# Patient Record
Sex: Female | Born: 1955 | Race: Black or African American | Hispanic: No | Marital: Single | Smoking: Never smoker
Health system: Southern US, Community
[De-identification: ages and names within clinical notes are randomized; demographics above are authoritative.]

## PROBLEM LIST (undated history)

## (undated) DIAGNOSIS — E119 Type 2 diabetes mellitus without complications: Secondary | ICD-10-CM

## (undated) DIAGNOSIS — D573 Sickle-cell trait: Secondary | ICD-10-CM

## (undated) DIAGNOSIS — I10 Essential (primary) hypertension: Secondary | ICD-10-CM

## (undated) DIAGNOSIS — T7840XA Allergy, unspecified, initial encounter: Secondary | ICD-10-CM

## (undated) DIAGNOSIS — E876 Hypokalemia: Secondary | ICD-10-CM

## (undated) DIAGNOSIS — H269 Unspecified cataract: Secondary | ICD-10-CM

## (undated) HISTORY — DX: Sickle-cell trait: D57.3

## (undated) HISTORY — DX: Type 2 diabetes mellitus without complications: E11.9

## (undated) HISTORY — DX: Allergy, unspecified, initial encounter: T78.40XA

## (undated) HISTORY — DX: Unspecified cataract: H26.9

## (undated) HISTORY — PX: HAND SURGERY: SHX662

---

## 2014-10-10 ENCOUNTER — Encounter (HOSPITAL_BASED_OUTPATIENT_CLINIC_OR_DEPARTMENT_OTHER): Payer: Self-pay | Admitting: Emergency Medicine

## 2014-10-10 ENCOUNTER — Emergency Department (HOSPITAL_BASED_OUTPATIENT_CLINIC_OR_DEPARTMENT_OTHER)
Admission: EM | Admit: 2014-10-10 | Discharge: 2014-10-10 | Disposition: A | Discharge status: Home / Self Care | Payer: Self-pay | Attending: Emergency Medicine | Admitting: Emergency Medicine

## 2014-10-10 DIAGNOSIS — R109 Unspecified abdominal pain: Secondary | ICD-10-CM | POA: Insufficient documentation

## 2014-10-10 DIAGNOSIS — R197 Diarrhea, unspecified: Secondary | ICD-10-CM | POA: Insufficient documentation

## 2014-10-10 DIAGNOSIS — R509 Fever, unspecified: Secondary | ICD-10-CM | POA: Insufficient documentation

## 2014-10-10 DIAGNOSIS — R112 Nausea with vomiting, unspecified: Secondary | ICD-10-CM

## 2014-10-10 DIAGNOSIS — Z880 Allergy status to penicillin: Secondary | ICD-10-CM | POA: Insufficient documentation

## 2014-10-10 DIAGNOSIS — R5383 Other fatigue: Secondary | ICD-10-CM | POA: Insufficient documentation

## 2014-10-10 LAB — CBC WITH DIFFERENTIAL/PLATELET
BASOS ABS: 0 10*3/uL (ref 0.0–0.1)
Basophils Relative: 0 % (ref 0–1)
EOS ABS: 0.1 10*3/uL (ref 0.0–0.7)
Eosinophils Relative: 1 % (ref 0–5)
HCT: 45.6 % (ref 36.0–46.0)
Hemoglobin: 15.5 g/dL — ABNORMAL HIGH (ref 12.0–15.0)
Lymphocytes Relative: 45 % (ref 12–46)
Lymphs Abs: 5.1 10*3/uL — ABNORMAL HIGH (ref 0.7–4.0)
MCH: 23.4 pg — AB (ref 26.0–34.0)
MCHC: 34 g/dL (ref 30.0–36.0)
MCV: 68.8 fL — ABNORMAL LOW (ref 78.0–100.0)
MONO ABS: 1 10*3/uL (ref 0.1–1.0)
Monocytes Relative: 9 % (ref 3–12)
NEUTROS PCT: 45 % (ref 43–77)
Neutro Abs: 5.1 10*3/uL (ref 1.7–7.7)
PLATELETS: 380 10*3/uL (ref 150–400)
RBC: 6.63 MIL/uL — ABNORMAL HIGH (ref 3.87–5.11)
RDW: 14.5 % (ref 11.5–15.5)
WBC: 11.3 10*3/uL — ABNORMAL HIGH (ref 4.0–10.5)

## 2014-10-10 LAB — COMPREHENSIVE METABOLIC PANEL
ALBUMIN: 4.5 g/dL (ref 3.5–5.2)
ALT: 44 U/L — AB (ref 0–35)
AST: 23 U/L (ref 0–37)
Alkaline Phosphatase: 106 U/L (ref 39–117)
Anion gap: 28 — ABNORMAL HIGH (ref 5–15)
BUN: 71 mg/dL — ABNORMAL HIGH (ref 6–23)
CALCIUM: 9.5 mg/dL (ref 8.4–10.5)
CO2: 22 mEq/L (ref 19–32)
CREATININE: 5.7 mg/dL — AB (ref 0.50–1.10)
Chloride: 82 mEq/L — ABNORMAL LOW (ref 96–112)
GFR calc Af Amer: 9 mL/min — ABNORMAL LOW (ref >90)
GFR, EST NON AFRICAN AMERICAN: 7 mL/min — AB (ref >90)
Glucose, Bld: 137 mg/dL — ABNORMAL HIGH (ref 70–99)
Potassium: 3.3 mEq/L — ABNORMAL LOW (ref 3.7–5.3)
SODIUM: 132 meq/L — AB (ref 137–147)
TOTAL PROTEIN: 9.8 g/dL — AB (ref 6.0–8.3)
Total Bilirubin: 0.4 mg/dL (ref 0.3–1.2)

## 2014-10-10 LAB — LIPASE, BLOOD: LIPASE: 84 U/L — AB (ref 11–59)

## 2014-10-10 MED ORDER — KETOROLAC TROMETHAMINE 30 MG/ML IJ SOLN
30.0000 mg | Freq: Once | INTRAMUSCULAR | Status: AC
  Administered 2014-10-10: 30 mg via INTRAVENOUS
  Filled 2014-10-10: qty 1

## 2014-10-10 MED ORDER — ONDANSETRON HCL 4 MG PO TABS: 4.0000 mg | ORAL_TABLET | Freq: Four times a day (QID) | ORAL | Status: DC

## 2014-10-10 MED ORDER — METOCLOPRAMIDE HCL 5 MG/ML IJ SOLN
10.0000 mg | Freq: Once | INTRAMUSCULAR | Status: AC
  Administered 2014-10-10: 10 mg via INTRAVENOUS
  Filled 2014-10-10: qty 2

## 2014-10-10 MED ORDER — DIPHENHYDRAMINE HCL 50 MG/ML IJ SOLN
25.0000 mg | Freq: Once | INTRAMUSCULAR | Status: AC
  Administered 2014-10-10: 25 mg via INTRAVENOUS
  Filled 2014-10-10: qty 1

## 2014-10-10 MED ORDER — SODIUM CHLORIDE 0.9 % IV BOLUS (SEPSIS)
1000.0000 mL | Freq: Once | INTRAVENOUS | Status: AC
  Administered 2014-10-10: 1000 mL via INTRAVENOUS

## 2014-10-10 NOTE — ED Notes (Signed)
Pt ambulated to restroom independently.

## 2014-10-10 NOTE — ED Notes (Signed)
Developed vomiting and diarrhea 5 days ago.  Abdominal cramping.

## 2014-10-10 NOTE — Discharge Instructions (Signed)
Please follow the directions provided.  Be sure to follow-up with your primary care provider to ensure you are getting better.  You may take the zofran to help you tolerate fluids by mouth.  You may take tylenol 650 mg every 4 hours or ibuprofen 400 mg every 4 hours to help with muscles aches.  Don't hesitate to return for any new, worsening, or concerning symptoms.    SEEK IMMEDIATE MEDICAL CARE IF:  You are unable to keep fluids down.  You do not urinate at least once every 6 to 8 hours.  You develop shortness of breath.  You notice blood in your stool or vomit. This may look like coffee grounds.  You have abdominal pain that increases or is concentrated in one small area (localized).  You have persistent vomiting or diarrhea.  You have a fever.

## 2014-10-10 NOTE — ED Provider Notes (Signed)
CSN: [id number]     Arrival date & time 10/10/14  1119 History   First MD Initiated Contact with Patient 10/10/14 1211     Chief Complaint  Patient presents with  . Emesis   (Consider location/radiation/quality/duration/timing/severity/associated sxs/prior Treatment) HPI Maria Robinson is a 58 yo female presenting with fatigue  She reports 5 days ago she began having diarrhea, subjective fever and fatigue.  Then 2 days ago she began having vomiting and abdominal cramping.  She has only vomited once today and 2 episodes of diarrhea which is an improvement but the abdominal and general muscle cramping.  She denies chest pain or shortness of breath, bloody or bilious emesis or hematachezia or melena.  History reviewed. No pertinent past medical history. History reviewed. No pertinent past surgical history. No family history on file. History  Substance Use Topics  . Smoking status: Never Smoker   . Smokeless tobacco: Not on file  . Alcohol Use: No   OB History   Grav Para Term Preterm Abortions TAB SAB Ect Mult Living                 Review of Systems  Constitutional: Positive for fever and fatigue. Negative for chills.  HENT: Negative for sore throat.   Eyes: Negative for visual disturbance.  Respiratory: Negative for cough and shortness of breath.   Cardiovascular: Negative for chest pain and leg swelling.  Gastrointestinal: Positive for nausea, vomiting, abdominal pain and diarrhea.  Genitourinary: Negative for dysuria.  Musculoskeletal: Negative for myalgias.  Skin: Negative for rash.  Neurological: Negative for weakness, numbness and headaches.    Allergies  Penicillins  Home Medications   Prior to Admission medications   Medication Sig Start Date End Date Taking? Authorizing Provider  ondansetron (ZOFRAN) 4 MG tablet Take 1 tablet (4 mg total) by mouth every 6 (six) hours. 10/10/14   Britt Bottom, NP   BP 119/86  Pulse 96  Temp(Src) 98.7 F (37.1 C) (Oral)   Resp 20  Ht 4\' 11"  (1.499 m)  Wt 145 lb (65.772 kg)  BMI 29.27 kg/m2  SpO2 97% Physical Exam  Nursing note and vitals reviewed. Constitutional: She appears well-developed and well-nourished. No distress.  HENT:  Head: Normocephalic and atraumatic.  Mouth/Throat: Mucous membranes are dry. No oropharyngeal exudate.  Eyes: Conjunctivae are normal.  Neck: Neck supple. No thyromegaly present.  Cardiovascular: Normal rate, regular rhythm and intact distal pulses.   Pulmonary/Chest: Effort normal and breath sounds normal. No respiratory distress. She has no wheezes. She has no rales. She exhibits no tenderness.  Abdominal: Soft. Bowel sounds are normal. She exhibits no distension and no mass. There is no hepatosplenomegaly. There is no tenderness. There is no rigidity, no rebound, no guarding, no CVA tenderness, no tenderness at McBurney's point and negative Murphy's sign.  Musculoskeletal: She exhibits no tenderness.  Lymphadenopathy:    She has no cervical adenopathy.  Neurological: She is alert.  Skin: Skin is warm and dry. No rash noted. She is not diaphoretic.  Psychiatric: She has a normal mood and affect.    ED Course  Procedures (including critical care time) Labs Review Labs Reviewed  CBC WITH DIFFERENTIAL - Abnormal; Notable for the following:    WBC 11.3 (*)    RBC 6.63 (*)    Hemoglobin 15.5 (*)    MCV 68.8 (*)    MCH 23.4 (*)    Lymphs Abs 5.1 (*)    All other components within normal limits  COMPREHENSIVE METABOLIC PANEL -  Abnormal; Notable for the following:    Sodium 132 (*)    Potassium 3.3 (*)    Chloride 82 (*)    Glucose, Bld 137 (*)    BUN 71 (*)    Creatinine, Ser 5.70 (*)    Total Protein 9.8 (*)    ALT 44 (*)    GFR calc non Af Amer 7 (*)    GFR calc Af Amer 9 (*)    Anion gap 28 (*)    All other components within normal limits  LIPASE, BLOOD - Abnormal; Notable for the following:    Lipase 84 (*)    All other components within normal limits    Imaging Review No results found.   EKG Interpretation None      MDM   Final diagnoses:  Nausea vomiting and diarrhea   58 yo female presenting with n/v/d and abd cramping.   NS bolus, meds for pain and nausea, CBC, CMP, lipase and UA  On re-eval, pt's symptoms have resolved and she is feeling much better.    Resources provided to establish care with a primary care provider for follow-up this week.    Pt is well-appearing and in no acute distress and verbalizes agreement with plan.  10/14/2014 6:42 PM Follow-up phone call, regarding elevated BUN and creatinine.  Pt reports unaware of existing kidney disease but has a follow-up appointment this Tuesday and will report elevated labs at appointment. Pt states she is feeling much better and is without any complaints.   Filed Vitals:   10/10/14 1135 10/10/14 1136 10/10/14 1231 10/10/14 1416  BP:  119/86 153/79 113/74  Pulse: 96  77 79  Temp: 98.7 F (37.1 C)   97.5 F (36.4 C)  TempSrc: Oral   Oral  Resp: 20  20 20   Height: 4\' 11"  (1.499 m)     Weight: 145 lb (65.772 kg)     SpO2: 97%  98% 100%   Meds given in ED:   Medications  sodium chloride 0.9 % bolus 1,000 mL (0 mLs Intravenous Stopped 10/10/14 1254)  metoCLOPramide (REGLAN) injection 10 mg (10 mg Intravenous Given 10/10/14 1230)  diphenhydrAMINE (BENADRYL) injection 25 mg (25 mg Intravenous Given 10/10/14 1230)  ketorolac (TORADOL) 30 MG/ML injection 30 mg (30 mg Intravenous Given 10/10/14 1230)    Discharge Medication List as of 10/10/2014  1:42 PM    START taking these medications   Details  ondansetron (ZOFRAN) 4 MG tablet Take 1 tablet (4 mg total) by mouth every 6 (six) hours., Starting 10/10/2014, Until Discontinued, Print           Britt Bottom, NP 10/14/14 [phone]

## 2014-10-15 NOTE — ED Provider Notes (Signed)
Medical screening examination/treatment/procedure(s) were performed by non-physician practitioner and as supervising physician I was immediately available for consultation/collaboration.   EKG Interpretation None        Malvin Johns, MD 10/15/14 [phone]

## 2017-10-05 DIAGNOSIS — M654 Radial styloid tenosynovitis [de Quervain]: Secondary | ICD-10-CM | POA: Insufficient documentation

## 2019-04-25 DIAGNOSIS — R7989 Other specified abnormal findings of blood chemistry: Secondary | ICD-10-CM

## 2019-04-25 DIAGNOSIS — R17 Unspecified jaundice: Secondary | ICD-10-CM

## 2019-05-06 DIAGNOSIS — Z5989 Other problems related to housing and economic circumstances: Secondary | ICD-10-CM | POA: Insufficient documentation

## 2019-05-09 DIAGNOSIS — I10 Essential (primary) hypertension: Secondary | ICD-10-CM | POA: Insufficient documentation

## 2019-05-09 DIAGNOSIS — J302 Other seasonal allergic rhinitis: Secondary | ICD-10-CM | POA: Insufficient documentation

## 2019-06-09 ENCOUNTER — Inpatient Hospital Stay (HOSPITAL_COMMUNITY): Payer: Self-pay

## 2019-06-09 ENCOUNTER — Other Ambulatory Visit: Payer: Self-pay

## 2019-06-09 ENCOUNTER — Inpatient Hospital Stay (HOSPITAL_COMMUNITY)
Admission: EM | Admit: 2019-06-09 | Discharge: 2019-06-14 | DRG: 638 | Disposition: A | Discharge status: Home / Self Care | Payer: Self-pay | Attending: Internal Medicine | Admitting: Internal Medicine

## 2019-06-09 ENCOUNTER — Encounter (HOSPITAL_COMMUNITY): Payer: Self-pay

## 2019-06-09 ENCOUNTER — Emergency Department (HOSPITAL_COMMUNITY): Payer: Self-pay

## 2019-06-09 DIAGNOSIS — E119 Type 2 diabetes mellitus without complications: Secondary | ICD-10-CM

## 2019-06-09 DIAGNOSIS — R7989 Other specified abnormal findings of blood chemistry: Secondary | ICD-10-CM

## 2019-06-09 DIAGNOSIS — K319 Disease of stomach and duodenum, unspecified: Secondary | ICD-10-CM | POA: Diagnosis present

## 2019-06-09 DIAGNOSIS — R17 Unspecified jaundice: Secondary | ICD-10-CM

## 2019-06-09 DIAGNOSIS — R1030 Lower abdominal pain, unspecified: Secondary | ICD-10-CM

## 2019-06-09 DIAGNOSIS — R748 Abnormal levels of other serum enzymes: Secondary | ICD-10-CM

## 2019-06-09 DIAGNOSIS — I10 Essential (primary) hypertension: Secondary | ICD-10-CM | POA: Diagnosis present

## 2019-06-09 DIAGNOSIS — Z1159 Encounter for screening for other viral diseases: Secondary | ICD-10-CM

## 2019-06-09 DIAGNOSIS — R1319 Other dysphagia: Secondary | ICD-10-CM

## 2019-06-09 DIAGNOSIS — M6281 Muscle weakness (generalized): Secondary | ICD-10-CM

## 2019-06-09 DIAGNOSIS — Z79899 Other long term (current) drug therapy: Secondary | ICD-10-CM

## 2019-06-09 DIAGNOSIS — K449 Diaphragmatic hernia without obstruction or gangrene: Secondary | ICD-10-CM | POA: Diagnosis present

## 2019-06-09 DIAGNOSIS — E1310 Other specified diabetes mellitus with ketoacidosis without coma: Secondary | ICD-10-CM

## 2019-06-09 DIAGNOSIS — K5900 Constipation, unspecified: Secondary | ICD-10-CM | POA: Diagnosis present

## 2019-06-09 DIAGNOSIS — Z833 Family history of diabetes mellitus: Secondary | ICD-10-CM

## 2019-06-09 DIAGNOSIS — K2960 Other gastritis without bleeding: Secondary | ICD-10-CM | POA: Diagnosis present

## 2019-06-09 DIAGNOSIS — D72820 Lymphocytosis (symptomatic): Secondary | ICD-10-CM | POA: Diagnosis present

## 2019-06-09 DIAGNOSIS — D509 Iron deficiency anemia, unspecified: Secondary | ICD-10-CM | POA: Diagnosis present

## 2019-06-09 DIAGNOSIS — E860 Dehydration: Secondary | ICD-10-CM | POA: Diagnosis present

## 2019-06-09 DIAGNOSIS — E871 Hypo-osmolality and hyponatremia: Secondary | ICD-10-CM

## 2019-06-09 DIAGNOSIS — E876 Hypokalemia: Secondary | ICD-10-CM | POA: Diagnosis present

## 2019-06-09 DIAGNOSIS — N179 Acute kidney failure, unspecified: Secondary | ICD-10-CM

## 2019-06-09 DIAGNOSIS — R1310 Dysphagia, unspecified: Secondary | ICD-10-CM

## 2019-06-09 DIAGNOSIS — E1110 Type 2 diabetes mellitus with ketoacidosis without coma: Principal | ICD-10-CM

## 2019-06-09 DIAGNOSIS — K8020 Calculus of gallbladder without cholecystitis without obstruction: Secondary | ICD-10-CM | POA: Diagnosis present

## 2019-06-09 HISTORY — DX: Hypokalemia: E87.6

## 2019-06-09 HISTORY — DX: Essential (primary) hypertension: I10

## 2019-06-09 LAB — GLUCOSE, CAPILLARY
Glucose-Capillary: 268 mg/dL — ABNORMAL HIGH (ref 70–99)
Glucose-Capillary: 201 mg/dL — ABNORMAL HIGH (ref 70–99)
Glucose-Capillary: 185 mg/dL — ABNORMAL HIGH (ref 70–99)
Glucose-Capillary: 130 mg/dL — ABNORMAL HIGH (ref 70–99)
Glucose-Capillary: 124 mg/dL — ABNORMAL HIGH (ref 70–99)

## 2019-06-09 LAB — CBG MONITORING, ED
Glucose-Capillary: 566 mg/dL (ref 70–99)
Glucose-Capillary: 434 mg/dL — ABNORMAL HIGH (ref 70–99)
Glucose-Capillary: 325 mg/dL — ABNORMAL HIGH (ref 70–99)
Glucose-Capillary: 264 mg/dL — ABNORMAL HIGH (ref 70–99)

## 2019-06-09 LAB — BASIC METABOLIC PANEL
Anion gap: 14 (ref 5–15)
Anion gap: 10 (ref 5–15)
BUN: 28 mg/dL — ABNORMAL HIGH (ref 8–23)
BUN: 26 mg/dL — ABNORMAL HIGH (ref 8–23)
CO2: 21 mmol/L — ABNORMAL LOW (ref 22–32)
CO2: 25 mmol/L (ref 22–32)
Calcium: 8 mg/dL — ABNORMAL LOW (ref 8.9–10.3)
Calcium: 8.1 mg/dL — ABNORMAL LOW (ref 8.9–10.3)
Chloride: 100 mmol/L (ref 98–111)
Chloride: 103 mmol/L (ref 98–111)
Creatinine, Ser: 1.01 mg/dL — ABNORMAL HIGH (ref 0.44–1.00)
Creatinine, Ser: 0.79 mg/dL (ref 0.44–1.00)
GFR calc Af Amer: >60 mL/min (ref >60)
GFR calc Af Amer: >60 mL/min (ref >60)
GFR calc non Af Amer: 60 mL/min — ABNORMAL LOW (ref >60)
GFR calc non Af Amer: >60 mL/min (ref >60)
Glucose, Bld: 299 mg/dL — ABNORMAL HIGH (ref 70–99)
Glucose, Bld: 195 mg/dL — ABNORMAL HIGH (ref 70–99)
Potassium: 4.2 mmol/L (ref 3.5–5.1)
Potassium: 3.6 mmol/L (ref 3.5–5.1)
Sodium: 135 mmol/L (ref 135–145)
Sodium: 138 mmol/L (ref 135–145)

## 2019-06-09 LAB — RAPID URINE DRUG SCREEN, HOSP PERFORMED
Amphetamines: NOT DETECTED
Barbiturates: NOT DETECTED
Benzodiazepines: NOT DETECTED
Cocaine: NOT DETECTED
Opiates: NOT DETECTED
Tetrahydrocannabinol: NOT DETECTED

## 2019-06-09 LAB — COMPREHENSIVE METABOLIC PANEL
ALT: 312 U/L — ABNORMAL HIGH (ref 0–44)
AST: 305 U/L — ABNORMAL HIGH (ref 15–41)
Albumin: 4.2 g/dL (ref 3.5–5.0)
Alkaline Phosphatase: 142 U/L — ABNORMAL HIGH (ref 38–126)
Anion gap: 25 — ABNORMAL HIGH (ref 5–15)
BUN: 34 mg/dL — ABNORMAL HIGH (ref 8–23)
CO2: 16 mmol/L — ABNORMAL LOW (ref 22–32)
Calcium: 9 mg/dL (ref 8.9–10.3)
Chloride: 85 mmol/L — ABNORMAL LOW (ref 98–111)
Creatinine, Ser: 1.42 mg/dL — ABNORMAL HIGH (ref 0.44–1.00)
GFR calc Af Amer: 46 mL/min — ABNORMAL LOW (ref >60)
GFR calc non Af Amer: 39 mL/min — ABNORMAL LOW (ref >60)
Glucose, Bld: 603 mg/dL (ref 70–99)
Potassium: 4.1 mmol/L (ref 3.5–5.1)
Sodium: 126 mmol/L — ABNORMAL LOW (ref 135–145)
Total Bilirubin: 2.6 mg/dL — ABNORMAL HIGH (ref 0.3–1.2)
Total Protein: 7.9 g/dL (ref 6.5–8.1)

## 2019-06-09 LAB — URINALYSIS, ROUTINE W REFLEX MICROSCOPIC
Bacteria, UA: NONE SEEN
Bilirubin Urine: NEGATIVE
Glucose, UA: >=500 mg/dL — AB
Hgb urine dipstick: NEGATIVE
Ketones, ur: 20 mg/dL — AB
Leukocytes,Ua: NEGATIVE
Nitrite: NEGATIVE
Protein, ur: NEGATIVE mg/dL
Specific Gravity, Urine: 1.022 (ref 1.005–1.030)
pH: 5 (ref 5.0–8.0)

## 2019-06-09 LAB — MRSA PCR SCREENING: MRSA by PCR: NEGATIVE

## 2019-06-09 LAB — HEMOGLOBIN A1C
Hgb A1c MFr Bld: 12.1 % — ABNORMAL HIGH (ref 4.8–5.6)
Mean Plasma Glucose: 300.57 mg/dL

## 2019-06-09 LAB — CBC WITH DIFFERENTIAL/PLATELET
Abs Immature Granulocytes: 0.04 10*3/uL (ref 0.00–0.07)
Basophils Absolute: 0.1 10*3/uL (ref 0.0–0.1)
Basophils Relative: 1 %
Eosinophils Absolute: 0 10*3/uL (ref 0.0–0.5)
Eosinophils Relative: 0 %
HCT: 41.3 % (ref 36.0–46.0)
Hemoglobin: 13.3 g/dL (ref 12.0–15.0)
Immature Granulocytes: 0 %
Lymphocytes Relative: 30 %
Lymphs Abs: 3.9 10*3/uL (ref 0.7–4.0)
MCH: 23.9 pg — ABNORMAL LOW (ref 26.0–34.0)
MCHC: 32.2 g/dL (ref 30.0–36.0)
MCV: 74.3 fL — ABNORMAL LOW (ref 80.0–100.0)
Monocytes Absolute: 1.1 10*3/uL — ABNORMAL HIGH (ref 0.1–1.0)
Monocytes Relative: 8 %
Neutro Abs: 8 10*3/uL — ABNORMAL HIGH (ref 1.7–7.7)
Neutrophils Relative %: 61 %
Platelets: 271 10*3/uL (ref 150–400)
RBC: 5.56 MIL/uL — ABNORMAL HIGH (ref 3.87–5.11)
RDW: 15.1 % (ref 11.5–15.5)
WBC: 13.1 10*3/uL — ABNORMAL HIGH (ref 4.0–10.5)
nRBC: 0 % (ref 0.0–0.2)

## 2019-06-09 LAB — ACETAMINOPHEN LEVEL: Acetaminophen (Tylenol), Serum: <10 ug/mL — ABNORMAL LOW (ref 10–30)

## 2019-06-09 LAB — ETHANOL: Alcohol, Ethyl (B): <10 mg/dL (ref <10)

## 2019-06-09 LAB — TROPONIN I: Troponin I: <0.03 ng/mL (ref <0.03)

## 2019-06-09 LAB — LIPASE, BLOOD: Lipase: 156 U/L — ABNORMAL HIGH (ref 11–51)

## 2019-06-09 LAB — SARS CORONAVIRUS 2 BY RT PCR (HOSPITAL ORDER, PERFORMED IN ~~LOC~~ HOSPITAL LAB): SARS Coronavirus 2: NEGATIVE

## 2019-06-09 MED ORDER — AMLODIPINE BESYLATE 5 MG PO TABS
10.0000 mg | ORAL_TABLET | Freq: Every day | ORAL | Status: DC
  Administered 2019-06-09: 10 mg via ORAL
  Filled 2019-06-09: qty 2

## 2019-06-09 MED ORDER — SODIUM CHLORIDE 0.9 % IV SOLN
INTRAVENOUS | Status: DC
  Administered 2019-06-09 (×2): via INTRAVENOUS

## 2019-06-09 MED ORDER — ENOXAPARIN SODIUM 40 MG/0.4ML ~~LOC~~ SOLN
40.0000 mg | SUBCUTANEOUS | Status: DC
  Administered 2019-06-09 – 2019-06-13 (×5): 40 mg via SUBCUTANEOUS
  Filled 2019-06-09 (×4): qty 0.4

## 2019-06-09 MED ORDER — ALUM & MAG HYDROXIDE-SIMETH 200-200-20 MG/5ML PO SUSP
30.0000 mL | Freq: Once | ORAL | Status: AC
  Administered 2019-06-09: 30 mL via ORAL
  Filled 2019-06-09: qty 30

## 2019-06-09 MED ORDER — POLYETHYLENE GLYCOL 3350 17 G PO PACK
17.0000 g | PACK | Freq: Every day | ORAL | Status: DC
  Administered 2019-06-09 – 2019-06-11 (×2): 17 g via ORAL
  Filled 2019-06-09 (×3): qty 1

## 2019-06-09 MED ORDER — SODIUM CHLORIDE 0.9 % IV BOLUS
1000.0000 mL | Freq: Once | INTRAVENOUS | Status: AC
  Administered 2019-06-09: 1000 mL via INTRAVENOUS

## 2019-06-09 MED ORDER — DEXTROSE 50 % IV SOLN: 25.0000 mL | INTRAVENOUS | Status: DC | PRN

## 2019-06-09 MED ORDER — CHLORHEXIDINE GLUCONATE CLOTH 2 % EX PADS
6.0000 | MEDICATED_PAD | Freq: Every day | CUTANEOUS | Status: DC
  Administered 2019-06-09: 6 via TOPICAL

## 2019-06-09 MED ORDER — INSULIN REGULAR BOLUS VIA INFUSION
0.0000 [IU] | Freq: Three times a day (TID) | INTRAVENOUS | Status: DC
  Filled 2019-06-09: qty 10

## 2019-06-09 MED ORDER — CHLORHEXIDINE GLUCONATE CLOTH 2 % EX PADS: 6.0000 | MEDICATED_PAD | Freq: Every day | CUTANEOUS | Status: DC

## 2019-06-09 MED ORDER — FAMOTIDINE IN NACL 20-0.9 MG/50ML-% IV SOLN
20.0000 mg | Freq: Once | INTRAVENOUS | Status: AC
  Administered 2019-06-09: 20 mg via INTRAVENOUS
  Filled 2019-06-09 (×2): qty 50

## 2019-06-09 MED ORDER — ONDANSETRON 4 MG PO TBDP
4.0000 mg | ORAL_TABLET | Freq: Once | ORAL | Status: DC | PRN
  Filled 2019-06-09: qty 1

## 2019-06-09 MED ORDER — INSULIN REGULAR(HUMAN) IN NACL 100-0.9 UT/100ML-% IV SOLN
INTRAVENOUS | Status: DC
  Administered 2019-06-09: 5.1 [IU]/h via INTRAVENOUS
  Filled 2019-06-09: qty 100

## 2019-06-09 MED ORDER — DEXTROSE-NACL 5-0.45 % IV SOLN
INTRAVENOUS | Status: DC
  Administered 2019-06-09 (×2): via INTRAVENOUS

## 2019-06-09 MED ORDER — ONDANSETRON HCL 4 MG/2ML IJ SOLN: 4.0000 mg | Freq: Four times a day (QID) | INTRAMUSCULAR | Status: DC | PRN

## 2019-06-09 MED ORDER — SODIUM CHLORIDE 0.9% FLUSH
3.0000 mL | Freq: Once | INTRAVENOUS | Status: AC
  Administered 2019-06-09: 3 mL via INTRAVENOUS

## 2019-06-09 MED ORDER — LIDOCAINE VISCOUS HCL 2 % MT SOLN
15.0000 mL | Freq: Once | OROMUCOSAL | Status: AC
  Administered 2019-06-09: 15 mL via ORAL
  Filled 2019-06-09: qty 15

## 2019-06-09 NOTE — ED Notes (Signed)
Date and time results received: 06/09/19 13:11  Test: Glucose  Critical Value: 603  Name of Provider Notified: Dr. Thurnell Garbe   Orders Received? Or Actions Taken?: Continue to monitor patient and await new orders.

## 2019-06-09 NOTE — ED Triage Notes (Signed)
Patient c/o abdominal pain, and lower back pain x 2 months. Patient states since April she has difficulty keeping anything down. Patient states after eating or drinking approx 15 minutes later she vomits everything that she puts in.  Patient states she has not been taking any of her BP meds because she felt that they were not doing her any good.  Patient states she had an appointment with her PCP today, but did no feel like they were doing her any good.

## 2019-06-09 NOTE — H&P (Signed)
History and Physical    [facility] ATF:[id number] DOB: 1956/02/04 DOA: 06/09/2019  PCP: Patient, No Pcp Per   Patient coming from: Home   Chief Complaint: Abdominal pain, lower back pain, generalized weakness  HPI: Maria Robinson is a 63 y.o. female with medical history significant of hypertension who presents from home with multiple complaints.  Patient states she has been weak Since last 2 months.  She has been following with her PCP but she is not feeling better.  Her appetite is poor.  She is throwing up and not tolerating any food by mouth.  Also complains of abdominal pain, low back pain.  She is peeing a lot has dry mouth and very thirsty.  Also complains of smelly urine.  Denies any other medical problems except for hypertension. She has subjective fever at home.  Denies chills, chest pain, shortness of breath or cough.  No contact with person found to be positive with COVID- 19.  She does not have a bowel movement since 3 to 4 days ago. Patient seen and examined the bedside in the emergency department.  Currently hemodynamically stable.  Was not in any acute distress. She is a non-smoker and does not drink alcohol.  She is usually healthy on her usual days, can walk without problem.  Lives by herself but has a son who takes care of her.  She has been recently taken off from her work due to [employer] pandemic.  ED Course: Found to have acute kidney injury, hyponatremia, hyperglycemia with DKA, elevated liver enzymes.  Started on IV fluids, initiated DKA protocol.  COVID-19 screening test negative.  Review of Systems: As per HPI otherwise 10 point review of systems negative.    Past Medical History:  Diagnosis Date   Hypertension    Low blood potassium     Past Surgical History:  Procedure Laterality Date   CESAREAN SECTION     x 3     reports that she has never smoked. She has never used smokeless tobacco. She reports that she does not drink alcohol or use  drugs.  Allergies  Allergen Reactions   Penicillins Swelling and Rash    Did it involve swelling of the face/tongue/throat, SOB, or low BP? y Did it involve sudden or severe rash/hives, skin peeling, or any reaction on the inside of your mouth or nose? y Did you need to seek medical attention at a hospital or doctor's office? y When did it last happen? 2026 If all above answers are NO, may proceed with cephalosporin use.    Family History  Problem Relation Age of Onset   Diabetes Father      Prior to Admission medications   Medication Sig Start Date End Date Taking? Authorizing Provider  amLODipine (NORVASC) 10 MG tablet Take 10 mg by mouth daily. 05/09/19  Yes [provider]  cetirizine (ZYRTEC) 5 MG tablet Take 5 mg by mouth daily.   Yes [provider]  hydrochlorothiazide (HYDRODIURIL) 25 MG tablet Take 25 mg by mouth daily. 05/26/19  Yes [provider]  KLOR-CON M10 10 MEQ tablet Take 10 mEq by mouth daily. 05/10/19  Yes [provider]  polyethylene glycol powder (GLYCOLAX/MIRALAX) 17 GM/SCOOP powder Take 17 g by mouth 2 (two) times daily as needed for constipation. 04/27/19  Yes [provider]  ondansetron (ZOFRAN) 4 MG tablet Take 1 tablet (4 mg total) by mouth every 6 (six) hours. Patient not taking: Reported on 06/09/2019 10/10/14   Britt Bottom, NP  Physical Exam: Vitals:   06/09/19 1530 06/09/19 1545 06/09/19 1600 06/09/19 1615  BP: 129/76 124/73 121/82 113/73  Pulse: 88 86 89 89  Resp: (!) 21 20 (!) 24 (!) 24  Temp:      TempSrc:      SpO2: 100% 99% 100% 99%  Weight:      Height:        Constitutional: weak Vitals:   06/09/19 1530 06/09/19 1545 06/09/19 1600 06/09/19 1615  BP: 129/76 124/73 121/82 113/73  Pulse: 88 86 89 89  Resp: (!) 21 20 (!) 24 (!) 24  Temp:      TempSrc:      SpO2: 100% 99% 100% 99%  Weight:      Height:       Eyes: PERRL, lids and conjunctivae normal ENMT: Mucous  membranes are dry. Posterior pharynx clear of any exudate or lesions.Normal dentition.  Neck: normal, supple, no masses, no thyromegaly Respiratory: clear to auscultation bilaterally, no wheezing, no crackles. Normal respiratory effort. No accessory muscle use.  Cardiovascular: Regular rate and rhythm, no murmurs / rubs / gallops. No extremity edema. 2+ pedal pulses. No carotid bruits.  Abdomen: no tenderness, no masses palpated. No hepatosplenomegaly. Bowel sounds positive.  Musculoskeletal: no clubbing / cyanosis. No joint deformity upper and lower extremities. Good ROM, no contractures. Normal muscle tone.  Skin: no rashes, lesions, ulcers. No induration Neurologic: CN 2-12 grossly intact. Sensation intact, DTR normal. Strength 5/5 in all 4.  Psychiatric: Normal judgment and insight. Alert and oriented x 3. Normal mood.   Foley Catheter:None  Labs on Admission: I have personally reviewed following labs and imaging studies  CBC: Recent Labs  Lab 06/09/19 1234  WBC 13.1*  NEUTROABS 8.0*  HGB 13.3  HCT 41.3  MCV 74.3*  PLT 267   Basic Metabolic Panel: Recent Labs  Lab 06/09/19 1234  NA 126*  K 4.1  CL 85*  CO2 16*  GLUCOSE 603*  BUN 34*  CREATININE 1.42*  CALCIUM 9.0   GFR: Estimated Creatinine Clearance: 33.4 mL/min (A) (by C-G formula based on SCr of 1.42 mg/dL (H)). Liver Function Tests: Recent Labs  Lab 06/09/19 1234  AST 305*  ALT 312*  ALKPHOS 142*  BILITOT 2.6*  PROT 7.9  ALBUMIN 4.2   Recent Labs  Lab 06/09/19 1234  LIPASE 156*   No results for input(s): AMMONIA in the last 168 hours. Coagulation Profile: No results for input(s): INR, PROTIME in the last 168 hours. Cardiac Enzymes: Recent Labs  Lab 06/09/19 1234  TROPONINI <0.03   BNP (last 3 results) No results for input(s): PROBNP in the last 8760 hours. HbA1C: No results for input(s): HGBA1C in the last 72 hours. CBG: Recent Labs  Lab 06/09/19 1344 06/09/19 1448 06/09/19 1606   GLUCAP 566* 434* 325*   Lipid Profile: No results for input(s): CHOL, HDL, LDLCALC, TRIG, CHOLHDL, LDLDIRECT in the last 72 hours. Thyroid Function Tests: No results for input(s): TSH, T4TOTAL, FREET4, T3FREE, THYROIDAB in the last 72 hours. Anemia Panel: No results for input(s): VITAMINB12, FOLATE, FERRITIN, TIBC, IRON, RETICCTPCT in the last 72 hours. Urine analysis:    Component Value Date/Time   COLORURINE YELLOW 06/09/2019 1328   APPEARANCEUR CLEAR 06/09/2019 1328   LABSPEC 1.022 06/09/2019 1328   PHURINE 5.0 06/09/2019 1328   GLUCOSEU >=500 (A) 06/09/2019 1328   HGBUR NEGATIVE 06/09/2019 1328   BILIRUBINUR NEGATIVE 06/09/2019 1328   KETONESUR 20 (A) 06/09/2019 1328   PROTEINUR NEGATIVE 06/09/2019 1328  NITRITE NEGATIVE 06/09/2019 [place] 06/09/2019 1328    Radiological Exams on Admission: Ct Abdomen Pelvis Wo Contrast  Result Date: 06/09/2019 CLINICAL DATA:  Abdominal pain and low back pain for 2 months. EXAM: CT ABDOMEN AND PELVIS WITHOUT CONTRAST TECHNIQUE: Multidetector CT imaging of the abdomen and pelvis was performed following the standard protocol without IV contrast. COMPARISON:  None. FINDINGS: Lower chest: Clear lung bases.  Heart normal in size Hepatobiliary: Normal liver. Few small stones in the gallbladder fundus. No gallbladder wall thickening or inflammation. No bile duct dilation Pancreas: Unremarkable. No pancreatic ductal dilatation or surrounding inflammatory changes. Spleen: Normal in size without focal abnormality. Adrenals/Urinary Tract: Adrenal glands are unremarkable. Kidneys are normal, without renal calculi, focal lesion, or hydronephrosis. Bladder is unremarkable. Stomach/Bowel: Stomach is within normal limits. Appendix appears normal. No evidence of bowel wall thickening, distention, or inflammatory changes. Vascular/Lymphatic: Aortic atherosclerotic calcifications. No aneurysm. No enlarged lymph nodes. Reproductive: Uterus and  bilateral adnexa are unremarkable. Other: No abdominal wall hernia or abnormality. No abdominopelvic ascites. Musculoskeletal: No fracture or acute finding. No osteoblastic or osteolytic lesions. Degenerative changes noted of the visualized spine. IMPRESSION: 1. No acute findings. 2. Degenerative changes of the lumbar spine may account for patient's low back pain 3. Small gallstones.  No acute cholecystitis. 4. Aortic atherosclerosis. Electronically Signed   By: Lajean Manes M.D.   On: 06/09/2019 15:03   Dg Chest 2 View  Result Date: 06/09/2019 CLINICAL DATA:  Nausea, vomiting and abdominal pain. EXAM: CHEST - 2 VIEW COMPARISON:  None. FINDINGS: Cardiac silhouette is normal in size. Normal mediastinal and hilar contours. Clear lungs.  No pleural effusion or pneumothorax. Skeletal structures are intact. IMPRESSION: No active cardiopulmonary disease. Electronically Signed   By: Lajean Manes M.D.   On: 06/09/2019 15:04     Assessment/Plan Principal Problem:   DKA (diabetic ketoacidoses) ([place]) Active Problems:   DM (diabetes mellitus) ([place])   AKI (acute kidney injury) ([place])   Elevated liver enzymes   Hyponatremia   Muscle weakness (generalized)   DKA: Started on IV fluids, DKA protocol.  No history of diabetes in the past.  We will check hemoglobin A1c level.  We will consult diabetic coordinator.  Keep her n.p.o. for now.  Monitor BMP every 4 hours. When anion gap closes, start on long-acting insulin and sliding scale.  Nausea/vomiting/abdominal pain: Most likely associated with DKA.  Continue supportive care.  Elevated liver enzymes/elevated lipase: Unknown etiology,associated with DKA?   CT abdomen/pelvis did not show any acute intra-abdominal findings besides cholelithiasis.  Patient does not have any abdominal tenderness.  No gallbladder wall thickening or pericholecystic fluid as per CT imaging.  Will check ultrasound of the right upper quadrant.  Will check acute hepatitis panel.  We  will follow-up liver enzymes tomorrow.  Hyponatremia: This is secondary to hyperosmolar hyponatremia.  Expect improvement with decrease in the blood sugar.  Acute kidney injury: Secondary to dehydration.  Continue IV fluids.  Hypertension: Currently blood pressure stable.  Continue current meds  Muscle weakness: We will request for physical therapy evaluation.     Severity of Illness: The appropriate patient status for this patient is INPATIENT.    DVT prophylaxis: Lovenox Code Status: Full Family Communication: None present at the bedside Consults called: None     Shelly Coss MD [facility] Pager [phone]  If 7PM-7AM, please contact night-coverage [email or web address] Password [email or web address]  06/09/2019, 4:33 PM

## 2019-06-09 NOTE — ED Notes (Signed)
Urine culture sent down to lab with urinalysis. 

## 2019-06-09 NOTE — ED Notes (Signed)
Ultrasound bedside.

## 2019-06-09 NOTE — ED Provider Notes (Signed)
[facility] DEPT Provider Note   CSN: [id number] Arrival date & time: 06/09/19  1115     History   Chief Complaint Chief Complaint  Patient presents with  . Abdominal Pain  . Back Pain  . Emesis    HPI Maria Robinson is a 63 y.o. female.      Abdominal Pain Associated symptoms: vomiting   Back Pain Associated symptoms: abdominal pain   Emesis Associated symptoms: abdominal pain     Pt was seen at 1230. Per pt, c/o gradual onset and worsening of persistent generalized abd pain and recurrent N/V for the past 2 months. Pt states she "just vomits every time I eat." Pt states she has been evaluated by her PMD and put " on medicines for my stomach and by BP" but she is not taking them "because they don't do anything." Denies back pain, no CP/SOB, no cough, no fevers, no rash, no injury, no black or blood in stools or emesis.   Past Medical History:  Diagnosis Date  . Hypertension   . Low blood potassium     There are no active problems to display for this patient.   Past Surgical History:  Procedure Laterality Date  . CESAREAN SECTION     x 3     OB History   No obstetric history on file.      Home Medications    Prior to Admission medications   Medication Sig Start Date End Date Taking? Authorizing Provider  amLODipine (NORVASC) 10 MG tablet Take 10 mg by mouth daily. 05/09/19  Yes [provider]  cetirizine (ZYRTEC) 5 MG tablet Take 5 mg by mouth daily.   Yes [provider]  hydrochlorothiazide (HYDRODIURIL) 25 MG tablet Take 25 mg by mouth daily. 05/26/19  Yes [provider]  KLOR-CON M10 10 MEQ tablet Take 10 mEq by mouth daily. 05/10/19  Yes [provider]  polyethylene glycol powder (GLYCOLAX/MIRALAX) 17 GM/SCOOP powder Take 17 g by mouth 2 (two) times daily as needed for constipation. 04/27/19  Yes [provider]  ondansetron (ZOFRAN) 4 MG tablet Take 1 tablet (4 mg total) by  mouth every 6 (six) hours. Patient not taking: Reported on 06/09/2019 10/10/14   Britt Bottom, NP    Family History Family History  Problem Relation Age of Onset  . Diabetes Father     Social History Social History   Tobacco Use  . Smoking status: Never Smoker  . Smokeless tobacco: Never Used  Substance Use Topics  . Alcohol use: No  . Drug use: No     Allergies   Penicillins   Review of Systems Review of Systems  Gastrointestinal: Positive for abdominal pain and vomiting.  Musculoskeletal: Positive for back pain.   ROS: Statement: All systems negative except as marked or noted in the HPI; Constitutional: Negative for fever and chills. ; ; Eyes: Negative for eye pain, redness and discharge. ; ; ENMT: Negative for ear pain, hoarseness, nasal congestion, sinus pressure and sore throat. ; ; Cardiovascular: Negative for chest pain, palpitations, diaphoresis, dyspnea and peripheral edema. ; ; Respiratory: Negative for cough, wheezing and stridor. ; ; Gastrointestinal: +N/V, abd pain. Negative for diarrhea, blood in stool, hematemesis, jaundice and rectal bleeding. . ; ; Genitourinary: Negative for dysuria, flank pain and hematuria. ; ; Musculoskeletal: Negative for back pain and neck pain. Negative for swelling and trauma.; ; Skin: Negative for pruritus, rash, abrasions, blisters, bruising and skin lesion.; ; Neuro: Negative for  headache, lightheadedness and neck stiffness. Negative for weakness, altered level of consciousness, altered mental status, extremity weakness, paresthesias, involuntary movement, seizure and syncope.       Physical Exam Updated Vital Signs BP 124/71   Pulse 94   Temp 98.5 F (36.9 C) (Oral)   Resp 18   Ht 4\' 11"  (1.499 m)   Wt 64 kg   SpO2 98%   BMI 28.48 kg/m    Patient Vitals for the past 24 hrs:  BP Temp Temp src Pulse Resp SpO2 Height Weight  06/09/19 1500 124/71 - - 94 18 98 % - -  06/09/19 1445 126/82 - - 100 19 100 % - -  06/09/19  1433 - - - - - - 4\' 11"  (1.499 m) 64 kg  06/09/19 1415 (!) 118/101 - - 90 20 100 % - -  06/09/19 1339 (!) 126/107 - - 99 20 100 % - -  06/09/19 1130 (!) 134/100 98.5 F (36.9 C) Oral (!) 108 16 100 % - -     Physical Exam 1235: Physical examination:  Nursing notes reviewed; Vital signs and O2 SAT reviewed;  Constitutional: Well developed, Well nourished, In no acute distress; Head:  Normocephalic, atraumatic; Eyes: EOMI, PERRL, No scleral icterus; ENMT: Mouth and pharynx normal, Mucous membranes dry; Neck: Supple, Full range of motion, No lymphadenopathy; Cardiovascular: Regular rate and rhythm, No gallop; Respiratory: Breath sounds clear & equal bilaterally, No wheezes.  Speaking full sentences with ease, Normal respiratory effort/excursion; Chest: Nontender, Movement normal; Abdomen: Soft, Nontender, Nondistended, Normal bowel sounds; Genitourinary: No CVA tenderness; Extremities: Peripheral pulses normal, No tenderness, No edema, No calf edema or asymmetry.; Neuro: AA&Ox3, Major CN grossly intact.  Speech clear. No gross focal motor or sensory deficits in extremities.; Skin: Color normal, Warm, Dry.     ED Treatments / Results  Labs (all labs ordered are listed, but only abnormal results are displayed)   EKG EKG Interpretation  Date/Time:  Thursday June 09 2019 12:46:03 EDT Ventricular Rate:  93 PR Interval:    QRS Duration: 81 QT Interval:  423 QTC Calculation: 527 R Axis:   27 Text Interpretation:  Sinus rhythm Low voltage, precordial leads Borderline T abnormalities, lateral leads Prolonged QT interval No old tracing to compare Confirmed by Francine Graven [phone]) on 06/09/2019 1:11:16 PM   Radiology   Procedures Procedures (including critical care time)  Medications Ordered in ED Medications  famotidine (PEPCID) IVPB 20 mg premix (has no administration in time range)  dextrose 5 %-0.45 % sodium chloride infusion (has no administration in time range)  insulin regular  bolus via infusion 0-10 Units (has no administration in time range)  insulin regular, human (MYXREDLIN) 100 units/ 100 mL infusion (3.7 Units/hr Intravenous Rate/Dose Change 06/09/19 1505)  dextrose 50 % solution 25 mL (has no administration in time range)  0.9 %  sodium chloride infusion (has no administration in time range)  sodium chloride flush (NS) 0.9 % injection 3 mL (3 mLs Intravenous Given 06/09/19 1356)  sodium chloride 0.9 % bolus 1,000 mL (1,000 mLs Intravenous Bolus from Bag 06/09/19 1353)  alum & mag hydroxide-simeth (MAALOX/MYLANTA) 200-200-20 MG/5ML suspension 30 mL (30 mLs Oral Given 06/09/19 1353)    And  lidocaine (XYLOCAINE) 2 % viscous mouth solution 15 mL (15 mLs Oral Given 06/09/19 1354)  sodium chloride 0.9 % bolus 1,000 mL (1,000 mLs Intravenous Bolus from Bag 06/09/19 1509)     Initial Impression / Assessment and Plan / ED Course  I have  reviewed the triage vital signs and the nursing notes.  Pertinent labs & imaging results that were available during my care of the patient were reviewed by me and considered in my medical decision making (see chart for details).     MDM Reviewed: previous chart, nursing note and vitals Reviewed previous: labs and ECG Interpretation: labs, ECG, x-ray and CT scan Total time providing critical care: 30-74 minutes. This excludes time spent performing separately reportable procedures and services. Consults: admitting MD   CRITICAL CARE Performed by: Francine Graven Total critical care time: 35 minutes Critical care time was exclusive of separately billable procedures and treating other patients. Critical care was necessary to treat or prevent imminent or life-threatening deterioration. Critical care was time spent personally by me on the following activities: development of treatment plan with patient and/or surrogate as well as nursing, discussions with consultants, evaluation of patient's response to treatment, examination of  patient, obtaining history from patient or surrogate, ordering and performing treatments and interventions, ordering and review of laboratory studies, ordering and review of radiographic studies, pulse oximetry and re-evaluation of patient's condition.  Results for orders placed or performed during the hospital encounter of 06/09/19  Lipase, blood  Result Value Ref Range   Lipase 156 (H) 11 - 51 U/L  Comprehensive metabolic panel  Result Value Ref Range   Sodium 126 (L) 135 - 145 mmol/L   Potassium 4.1 3.5 - 5.1 mmol/L   Chloride 85 (L) 98 - 111 mmol/L   CO2 16 (L) 22 - 32 mmol/L   Glucose, Bld 603 (HH) 70 - 99 mg/dL   BUN 34 (H) 8 - 23 mg/dL   Creatinine, Ser 1.42 (H) 0.44 - 1.00 mg/dL   Calcium 9.0 8.9 - 10.3 mg/dL   Total Protein 7.9 6.5 - 8.1 g/dL   Albumin 4.2 3.5 - 5.0 g/dL   AST 305 (H) 15 - 41 U/L   ALT 312 (H) 0 - 44 U/L   Alkaline Phosphatase 142 (H) 38 - 126 U/L   Total Bilirubin 2.6 (H) 0.3 - 1.2 mg/dL   GFR calc non Af Amer 39 (L) >60 mL/min   GFR calc Af Amer 46 (L) >60 mL/min   Anion gap 25 (H) 5 - 15  Urinalysis, Routine w reflex microscopic  Result Value Ref Range   Color, Urine YELLOW YELLOW   APPearance CLEAR CLEAR   Specific Gravity, Urine 1.022 1.005 - 1.030   pH 5.0 5.0 - 8.0   Glucose, UA >=500 (A) NEGATIVE mg/dL   Hgb urine dipstick NEGATIVE NEGATIVE   Bilirubin Urine NEGATIVE NEGATIVE   Ketones, ur 20 (A) NEGATIVE mg/dL   Protein, ur NEGATIVE NEGATIVE mg/dL   Nitrite NEGATIVE NEGATIVE   Leukocytes,Ua NEGATIVE NEGATIVE   RBC / HPF 0-5 0 - 5 RBC/hpf   WBC, UA 0-5 0 - 5 WBC/hpf   Bacteria, UA NONE SEEN NONE SEEN   Squamous Epithelial / LPF 0-5 0 - 5   Mucus PRESENT    Hyaline Casts, UA PRESENT   CBC with Differential  Result Value Ref Range   WBC 13.1 (H) 4.0 - 10.5 K/uL   RBC 5.56 (H) 3.87 - 5.11 MIL/uL   Hemoglobin 13.3 12.0 - 15.0 g/dL   HCT 41.3 36.0 - 46.0 %   MCV 74.3 (L) 80.0 - 100.0 fL   MCH 23.9 (L) 26.0 - 34.0 pg   MCHC 32.2 30.0 -  36.0 g/dL   RDW 15.1 11.5 - 15.5 %   Platelets  271 150 - 400 K/uL   nRBC 0.0 0.0 - 0.2 %   Neutrophils Relative % 61 %   Neutro Abs 8.0 (H) 1.7 - 7.7 K/uL   Lymphocytes Relative 30 %   Lymphs Abs 3.9 0.7 - 4.0 K/uL   Monocytes Relative 8 %   Monocytes Absolute 1.1 (H) 0.1 - 1.0 K/uL   Eosinophils Relative 0 %   Eosinophils Absolute 0.0 0.0 - 0.5 K/uL   Basophils Relative 1 %   Basophils Absolute 0.1 0.0 - 0.1 K/uL   Immature Granulocytes 0 %   Abs Immature Granulocytes 0.04 0.00 - 0.07 K/uL  Troponin I - Once  Result Value Ref Range   Troponin I <0.03 <0.03 ng/mL  Urine rapid drug screen (hosp performed)  Result Value Ref Range   Opiates NONE DETECTED NONE DETECTED   Cocaine NONE DETECTED NONE DETECTED   Benzodiazepines NONE DETECTED NONE DETECTED   Amphetamines NONE DETECTED NONE DETECTED   Tetrahydrocannabinol NONE DETECTED NONE DETECTED   Barbiturates NONE DETECTED NONE DETECTED  Acetaminophen level  Result Value Ref Range   Acetaminophen (Tylenol), Serum <10 (L) 10 - 30 ug/mL  Ethanol  Result Value Ref Range   Alcohol, Ethyl (B) <10 <10 mg/dL  CBG monitoring, ED  Result Value Ref Range   Glucose-Capillary 566 (HH) 70 - 99 mg/dL   Comment 1 Notify RN   CBG monitoring, ED  Result Value Ref Range   Glucose-Capillary 434 (H) 70 - 99 mg/dL   Ct Abdomen Pelvis Wo Contrast Result Date: 06/09/2019 CLINICAL DATA:  Abdominal pain and low back pain for 2 months. EXAM: CT ABDOMEN AND PELVIS WITHOUT CONTRAST TECHNIQUE: Multidetector CT imaging of the abdomen and pelvis was performed following the standard protocol without IV contrast. COMPARISON:  None. FINDINGS: Lower chest: Clear lung bases.  Heart normal in size Hepatobiliary: Normal liver. Few small stones in the gallbladder fundus. No gallbladder wall thickening or inflammation. No bile duct dilation Pancreas: Unremarkable. No pancreatic ductal dilatation or surrounding inflammatory changes. Spleen: Normal in size without focal  abnormality. Adrenals/Urinary Tract: Adrenal glands are unremarkable. Kidneys are normal, without renal calculi, focal lesion, or hydronephrosis. Bladder is unremarkable. Stomach/Bowel: Stomach is within normal limits. Appendix appears normal. No evidence of bowel wall thickening, distention, or inflammatory changes. Vascular/Lymphatic: Aortic atherosclerotic calcifications. No aneurysm. No enlarged lymph nodes. Reproductive: Uterus and bilateral adnexa are unremarkable. Other: No abdominal wall hernia or abnormality. No abdominopelvic ascites. Musculoskeletal: No fracture or acute finding. No osteoblastic or osteolytic lesions. Degenerative changes noted of the visualized spine. IMPRESSION: 1. No acute findings. 2. Degenerative changes of the lumbar spine may account for patient's low back pain 3. Small gallstones.  No acute cholecystitis. 4. Aortic atherosclerosis. Electronically Signed   By: Lajean Manes M.D.   On: 06/09/2019 15:03   Dg Chest 2 View Result Date: 06/09/2019 CLINICAL DATA:  Nausea, vomiting and abdominal pain. EXAM: CHEST - 2 VIEW COMPARISON:  None. FINDINGS: Cardiac silhouette is normal in size. Normal mediastinal and hilar contours. Clear lungs.  No pleural effusion or pneumothorax. Skeletal structures are intact. IMPRESSION: No active cardiopulmonary disease. Electronically Signed   By: Lajean Manes M.D.   On: 06/09/2019 15:04    Results for MYLINH, CRAGG (MRN [id number]) as of 06/09/2019 15:24  Ref. Range 10/10/2014 11:50 06/09/2019 12:34  AST Latest Ref Range: 15 - 41 U/L 23 305 (H)  ALT Latest Ref Range: 0 - 44 U/L 44 (H) 312 (H)    Julieanne Bekele was evaluated  in Emergency Department on 06/09/2019 for the symptoms described in the history of present illness. She was evaluated in the context of the global COVID-19 pandemic, which necessitated consideration that the patient might be at risk for infection with the SARS-CoV-2 virus that causes COVID-19. Institutional protocols  and algorithms that pertain to the evaluation of patients at risk for COVID-19 are in a state of rapid change based on information released by regulatory bodies including the CDC and federal and state organizations. These policies and algorithms were followed during the patient's care in the ED.   1540:  LFT's elevated; CT scan without acute abd process. CBG elevated with elevated AG; IVF boluses given and IV insulin gtt started. T/C returned from [facility] Dr. Tawanna Solo, case discussed, including:  HPI, pertinent PM/SHx, VS/PE, dx testing, ED course and treatment:  Agreeable to admit.       Final Clinical Impressions(s) / ED Diagnoses   Final diagnoses:  None    ED Discharge Orders    None       Francine Graven, DO 06/13/19 [phone]

## 2019-06-10 DIAGNOSIS — D509 Iron deficiency anemia, unspecified: Secondary | ICD-10-CM

## 2019-06-10 DIAGNOSIS — R748 Abnormal levels of other serum enzymes: Secondary | ICD-10-CM

## 2019-06-10 DIAGNOSIS — K5900 Constipation, unspecified: Secondary | ICD-10-CM

## 2019-06-10 DIAGNOSIS — R1030 Lower abdominal pain, unspecified: Secondary | ICD-10-CM

## 2019-06-10 LAB — CBC
HCT: 31.9 % — ABNORMAL LOW (ref 36.0–46.0)
Hemoglobin: 10.2 g/dL — ABNORMAL LOW (ref 12.0–15.0)
MCH: 23.6 pg — ABNORMAL LOW (ref 26.0–34.0)
MCHC: 32 g/dL (ref 30.0–36.0)
MCV: 73.8 fL — ABNORMAL LOW (ref 80.0–100.0)
Platelets: 208 10*3/uL (ref 150–400)
RBC: 4.32 MIL/uL (ref 3.87–5.11)
RDW: 14.9 % (ref 11.5–15.5)
WBC: 13.1 10*3/uL — ABNORMAL HIGH (ref 4.0–10.5)
nRBC: 0 % (ref 0.0–0.2)

## 2019-06-10 LAB — GLUCOSE, CAPILLARY
Glucose-Capillary: 102 mg/dL — ABNORMAL HIGH (ref 70–99)
Glucose-Capillary: 130 mg/dL — ABNORMAL HIGH (ref 70–99)
Glucose-Capillary: 145 mg/dL — ABNORMAL HIGH (ref 70–99)
Glucose-Capillary: 156 mg/dL — ABNORMAL HIGH (ref 70–99)
Glucose-Capillary: 164 mg/dL — ABNORMAL HIGH (ref 70–99)
Glucose-Capillary: 173 mg/dL — ABNORMAL HIGH (ref 70–99)
Glucose-Capillary: 180 mg/dL — ABNORMAL HIGH (ref 70–99)
Glucose-Capillary: 216 mg/dL — ABNORMAL HIGH (ref 70–99)

## 2019-06-10 LAB — IRON AND TIBC
Iron: 116 ug/dL (ref 28–170)
Saturation Ratios: 45 % — ABNORMAL HIGH (ref 10.4–31.8)
TIBC: 256 ug/dL (ref 250–450)
UIBC: 140 ug/dL

## 2019-06-10 LAB — COMPREHENSIVE METABOLIC PANEL
ALT: 337 U/L — ABNORMAL HIGH (ref 0–44)
AST: 353 U/L — ABNORMAL HIGH (ref 15–41)
Albumin: 3.3 g/dL — ABNORMAL LOW (ref 3.5–5.0)
Alkaline Phosphatase: 108 U/L (ref 38–126)
Anion gap: 9 (ref 5–15)
BUN: 19 mg/dL (ref 8–23)
CO2: 23 mmol/L (ref 22–32)
Calcium: 7.9 mg/dL — ABNORMAL LOW (ref 8.9–10.3)
Chloride: 103 mmol/L (ref 98–111)
Creatinine, Ser: 0.73 mg/dL (ref 0.44–1.00)
GFR calc Af Amer: >60 mL/min (ref >60)
GFR calc non Af Amer: >60 mL/min (ref >60)
Glucose, Bld: 152 mg/dL — ABNORMAL HIGH (ref 70–99)
Potassium: 3.3 mmol/L — ABNORMAL LOW (ref 3.5–5.1)
Sodium: 135 mmol/L (ref 135–145)
Total Bilirubin: 1.5 mg/dL — ABNORMAL HIGH (ref 0.3–1.2)
Total Protein: 6.3 g/dL — ABNORMAL LOW (ref 6.5–8.1)

## 2019-06-10 LAB — HEPATITIS PANEL, ACUTE
HCV Ab: >11 s/co ratio — ABNORMAL HIGH (ref 0.0–0.9)
Hep A IgM: NEGATIVE
Hep B C IgM: NEGATIVE
Hepatitis B Surface Ag: NEGATIVE

## 2019-06-10 LAB — HIV ANTIBODY (ROUTINE TESTING W REFLEX): HIV Screen 4th Generation wRfx: NONREACTIVE

## 2019-06-10 LAB — MAGNESIUM: Magnesium: 2.1 mg/dL (ref 1.7–2.4)

## 2019-06-10 LAB — FOLATE: Folate: 10.9 ng/mL (ref >5.9)

## 2019-06-10 LAB — PROTIME-INR
INR: 0.9 (ref 0.8–1.2)
Prothrombin Time: 12.5 seconds (ref 11.4–15.2)

## 2019-06-10 LAB — RETICULOCYTES
Immature Retic Fract: 14.3 % (ref 2.3–15.9)
RBC.: 4.35 MIL/uL (ref 3.87–5.11)
Retic Count, Absolute: 77.4 10*3/uL (ref 19.0–186.0)
Retic Ct Pct: 1.8 % (ref 0.4–3.1)

## 2019-06-10 LAB — FERRITIN: Ferritin: 1046 ng/mL — ABNORMAL HIGH (ref 11–307)

## 2019-06-10 LAB — VITAMIN B12: Vitamin B-12: 1597 pg/mL — ABNORMAL HIGH (ref 180–914)

## 2019-06-10 MED ORDER — INSULIN STARTER KIT- PEN NEEDLES (ENGLISH)
1.0000 | Freq: Once | Status: DC
  Filled 2019-06-10 (×2): qty 1

## 2019-06-10 MED ORDER — SODIUM CHLORIDE 0.9 % IV SOLN
INTRAVENOUS | Status: DC
  Administered 2019-06-10 – 2019-06-14 (×8): via INTRAVENOUS

## 2019-06-10 MED ORDER — INSULIN GLARGINE 100 UNIT/ML ~~LOC~~ SOLN
10.0000 [IU] | Freq: Every day | SUBCUTANEOUS | Status: DC
  Filled 2019-06-10: qty 0.1

## 2019-06-10 MED ORDER — INSULIN GLARGINE 100 UNIT/ML ~~LOC~~ SOLN
10.0000 [IU] | Freq: Once | SUBCUTANEOUS | Status: AC
  Administered 2019-06-10: 10 [IU] via SUBCUTANEOUS
  Filled 2019-06-10: qty 0.1

## 2019-06-10 MED ORDER — POTASSIUM CHLORIDE CRYS ER 20 MEQ PO TBCR
40.0000 meq | EXTENDED_RELEASE_TABLET | Freq: Once | ORAL | Status: AC
  Administered 2019-06-10: 40 meq via ORAL
  Filled 2019-06-10: qty 2

## 2019-06-10 MED ORDER — INSULIN ASPART 100 UNIT/ML ~~LOC~~ SOLN: 0.0000 [IU] | SUBCUTANEOUS | Status: DC

## 2019-06-10 MED ORDER — PANTOPRAZOLE SODIUM 40 MG IV SOLR
40.0000 mg | Freq: Two times a day (BID) | INTRAVENOUS | Status: DC
  Administered 2019-06-10 – 2019-06-14 (×9): 40 mg via INTRAVENOUS
  Filled 2019-06-10 (×9): qty 40

## 2019-06-10 MED ORDER — AMLODIPINE BESYLATE 5 MG PO TABS
5.0000 mg | ORAL_TABLET | Freq: Every day | ORAL | Status: DC
  Administered 2019-06-11 – 2019-06-12 (×2): 5 mg via ORAL
  Filled 2019-06-10 (×3): qty 1

## 2019-06-10 MED ORDER — INSULIN GLARGINE 100 UNIT/ML ~~LOC~~ SOLN
10.0000 [IU] | Freq: Every day | SUBCUTANEOUS | Status: DC
  Administered 2019-06-10 – 2019-06-12 (×3): 10 [IU] via SUBCUTANEOUS
  Filled 2019-06-10 (×3): qty 0.1

## 2019-06-10 MED ORDER — POTASSIUM CHLORIDE 10 MEQ/100ML IV SOLN
10.0000 meq | INTRAVENOUS | Status: AC
  Administered 2019-06-10 (×3): 10 meq via INTRAVENOUS
  Filled 2019-06-10 (×3): qty 100

## 2019-06-10 MED ORDER — DICYCLOMINE HCL 10 MG PO CAPS
10.0000 mg | ORAL_CAPSULE | Freq: Three times a day (TID) | ORAL | Status: DC
  Administered 2019-06-10 – 2019-06-14 (×12): 10 mg via ORAL
  Filled 2019-06-10 (×14): qty 1

## 2019-06-10 MED ORDER — INSULIN GLARGINE 100 UNIT/ML ~~LOC~~ SOLN
15.0000 [IU] | Freq: Every day | SUBCUTANEOUS | Status: DC
  Filled 2019-06-10: qty 0.15

## 2019-06-10 MED ORDER — LIVING WELL WITH DIABETES BOOK
Freq: Once | Status: DC
  Filled 2019-06-10: qty 1

## 2019-06-10 MED ORDER — INSULIN ASPART 100 UNIT/ML ~~LOC~~ SOLN
0.0000 [IU] | Freq: Three times a day (TID) | SUBCUTANEOUS | Status: DC
  Administered 2019-06-10: 3 [IU] via SUBCUTANEOUS
  Administered 2019-06-10: 5 [IU] via SUBCUTANEOUS
  Administered 2019-06-10: 3 [IU] via SUBCUTANEOUS
  Administered 2019-06-11: 17:00:00 8 [IU] via SUBCUTANEOUS
  Administered 2019-06-11: 2 [IU] via SUBCUTANEOUS

## 2019-06-10 MED ORDER — SODIUM CHLORIDE 0.9 % IV SOLN
INTRAVENOUS | Status: DC | PRN
  Administered 2019-06-10: 10:00:00 via INTRAVENOUS

## 2019-06-10 MED ORDER — POTASSIUM CHLORIDE CRYS ER 20 MEQ PO TBCR: 40.0000 meq | EXTENDED_RELEASE_TABLET | Freq: Once | ORAL | Status: DC

## 2019-06-10 NOTE — Progress Notes (Signed)
Initial Nutrition Assessment  RD working remotely.  DOCUMENTATION CODES:   Not applicable  INTERVENTION:   - Pt would benefit from DM diet education. Unable to reach pt via phone call today. Will attach "Carbohydrate Counting for People with Diabetes" handout to discharge instructions and monitor for ability to provide education at follow-up.  - RD will monitor for diet advancement and supplement as appropriate  NUTRITION DIAGNOSIS:   Inadequate oral intake related to acute illness as evidenced by NPO status.  GOAL:   Patient will meet greater than or equal to 90% of their needs  MONITOR:   Diet advancement, Labs, I & O's, Weight trends  REASON FOR ASSESSMENT:   Malnutrition Screening Tool    ASSESSMENT:   63 year old female who presented to the ED on 6/18 with c/o abdominal pain and lower back pain x 2 months. Pt reports she is vomiting and not tolerating any food by mouth. Pt found to have AKI, hyponatremia, hyperglycemia with DKA, elevated liver enzymes.  Pt with no history of DM. Hemoglobin A1C on admission is 12.1. RD attempted to speak with pt today via phone call to room. However, pt did not answer.  Pt would benefit from DM diet education. RD will attach "Carbohydrate Counting for People with Diabetes" to pt's discharge instructions. Will monitor for appropriate time to educate pt at follow-up.  Pt is NPO. Will monitor for diet advancement and provide oral nutrition supplements as appropriate.  Medications reviewed and include: SSI Lantus 10 units daily, Protonix, Miralax, IV KCl 10 mEq x 3 runs IVF: NS @ 100 ml/hr  Labs reviewed: potassium 3.3, elevated LFTs, hemoglobin A1C 12.1 CBG's: 130-216 x 12 hours  UOP: 3100 ml x 24 hours I/O's: -1.6 L since admit  NUTRITION - FOCUSED PHYSICAL EXAM:  Unable to complete at this time. RD working remotely.  Diet Order:   Diet Order            Diet NPO time specified  Diet effective now               EDUCATION NEEDS:   Not appropriate for education at this time  Skin:  Skin Assessment: Reviewed RN Assessment  Last BM:  06/07/19  Height:   Ht Readings from Last 1 Encounters:  06/09/19 4\' 11"  (1.499 m)    Weight:   Wt Readings from Last 1 Encounters:  06/09/19 62.2 kg    Ideal Body Weight:  43.2 kg  BMI:  Body mass index is 27.7 kg/m.  Estimated Nutritional Needs:   Kcal:  1450-1650  Protein:  70-80 grams  Fluid:  >/= 1.5 L    Gaynell Face, MS, RD, LDN Inpatient Clinical Dietitian Pager: [phone] Weekend/After Hours: [phone]

## 2019-06-10 NOTE — Progress Notes (Signed)
PROGRESS NOTE    Maria Robinson  ONG:[id number] DOB: 1956-11-06 DOA: 06/09/2019 PCP: Patient, No Pcp Per    Brief Narrative; 63 year old with past medical history significant for hypertension who present with multiple complaints.  Patient reports feeling weak, poor appetite.  Report of nausea and vomiting.  She has also been complaining of abdominal pain radiating to her back.  She also report increase thirsty and dry mouth.  She denies bowel movement since 3 days prior to admission.  Evaluation in the ED patient was found to have acute kidney injury, hyponatremia, hyperglycemia with DKA, elevated  liver enzymes.  Assessment & Plan:   Principal Problem:   DKA (diabetic ketoacidoses) ([place]) Active Problems:   DM (diabetes mellitus) ([place])   AKI (acute kidney injury) ([place])   Elevated liver enzymes   Hyponatremia   Muscle weakness (generalized)   1-DKA: New diagnosis for diabetes. Hemoglobin A1c:12 Patient was initially treated with IV fluids, insulin drip.  Her gap is close blood sugar has decreased.  She was transitioned to Lantus 10 units.  Continue with a sliding scale insulin. Diabetic  educator will discuss diet with patient  2-Transaminases; Abdominal pain, nausea vomiting.  Nausea vomiting could have be related to DKA.  But patient presents with mildly increased lipase and transaminases.  Gallbladder with cholelithiasis. Hepatitis C antibody positive.  Will check quantitative hepatitis C level. GI has been consulted.  3-dysphagia to liquids: GI consulted.  Start PPI.  4-hypertension: Continue with Norvasc.  5-Leukocytosis: Chest x-ray negative.  UA negative for infection.  Right upper quadrant ultrasound negative for cholecystitis.  Follow white blood cell trend.  6-hypokalemia: Replete IV.    Estimated body mass index is 27.7 kg/m as calculated from the following:   Height as of this encounter: 4\' 11"  (1.499 m).   Weight as of this encounter: 62.2 kg.   DVT  prophylaxis: SCDs Code Status: Full code Family Communication: Care discussed with patient.  I offered to update a family member, patient relate that she will update her family. Disposition Plan: Remain in the hospital for IV fluids, further evaluation of dysphagia and transaminases  Consultants:   GI   Procedures:   Right upper quadrant ultrasound:Cholelithiasis and gallbladder sludge.  Negative for cholecystitis     Antimicrobials:   None   Subjective: Patient report abdominal pain intermittent nausea and vomiting since March.  She has also been feeling thirsty and dry mouth and weak.  Objective: Vitals:   06/10/19 0200 06/10/19 0300 06/10/19 0400 06/10/19 0500  BP: 115/90 105/64 102/86 99/60  Pulse: 83 78 97 74  Resp: (!) 21 17 19    Temp:  98.6 F (37 C)    TempSrc:  Oral    SpO2: 99% 99% 96% 95%  Weight:      Height:        Intake/Output Summary (Last 24 hours) at 06/10/2019 0709 Last data filed at 06/10/2019 0600 Gross per 24 hour  Intake 1379.15 ml  Output 3100 ml  Net -1720.85 ml   Filed Weights   06/09/19 1433 06/09/19 1700  Weight: 64 kg 62.2 kg    Examination:  General exam: Appears calm and comfortable  Respiratory system: Clear to auscultation. Respiratory effort normal. Cardiovascular system: S1 & S2 heard, RRR. No JVD, murmurs, rubs, gallops or clicks. No pedal edema. Gastrointestinal system: Abdomen is nondistended, soft and nontender. No organomegaly or masses felt. Normal bowel sounds heard. Central nervous system: Alert and oriented. No focal neurological deficits. Extremities: Symmetric 5 x 5  power. Skin: No rashes, lesions or ulcers Psychiatry: Judgement and insight appear normal. Mood & affect appropriate.     Data Reviewed: I have personally reviewed following labs and imaging studies  CBC: Recent Labs  Lab 06/09/19 1234 06/10/19 0036  WBC 13.1* 13.1*  NEUTROABS 8.0*  --   HGB 13.3 10.2*  HCT 41.3 31.9*  MCV 74.3* 73.8*   PLT 271 973   Basic Metabolic Panel: Recent Labs  Lab 06/09/19 1234 06/09/19 1838 06/09/19 2041 06/10/19 0036  NA 126* 135 138 135  K 4.1 4.2 3.6 3.3*  CL 85* 100 103 103  CO2 16* 21* 25 23  GLUCOSE 603* 299* 195* 152*  BUN 34* 28* 26* 19  CREATININE 1.42* 1.01* 0.79 0.73  CALCIUM 9.0 8.0* 8.1* 7.9*   GFR: Estimated Creatinine Clearance: 58.5 mL/min (by C-G formula based on SCr of 0.73 mg/dL). Liver Function Tests: Recent Labs  Lab 06/09/19 1234 06/10/19 0036  AST 305* 353*  ALT 312* 337*  ALKPHOS 142* 108  BILITOT 2.6* 1.5*  PROT 7.9 6.3*  ALBUMIN 4.2 3.3*   Recent Labs  Lab 06/09/19 1234  LIPASE 156*   No results for input(s): AMMONIA in the last 168 hours. Coagulation Profile: No results for input(s): INR, PROTIME in the last 168 hours. Cardiac Enzymes: Recent Labs  Lab 06/09/19 1234  TROPONINI <0.03   BNP (last 3 results) No results for input(s): PROBNP in the last 8760 hours. HbA1C: Recent Labs    06/09/19 1230  HGBA1C 12.1*   CBG: Recent Labs  Lab 06/09/19 2247 06/10/19 0005 06/10/19 0109 06/10/19 0239 06/10/19 0353  GLUCAP 124* 145* 130* 156* 173*   Lipid Profile: No results for input(s): CHOL, HDL, LDLCALC, TRIG, CHOLHDL, LDLDIRECT in the last 72 hours. Thyroid Function Tests: No results for input(s): TSH, T4TOTAL, FREET4, T3FREE, THYROIDAB in the last 72 hours. Anemia Panel: No results for input(s): VITAMINB12, FOLATE, FERRITIN, TIBC, IRON, RETICCTPCT in the last 72 hours. Sepsis Labs: No results for input(s): PROCALCITON, LATICACIDVEN in the last 168 hours.  Recent Results (from the past 240 hour(s))  SARS Coronavirus 2 (CEPHEID - Performed in [place] hospital lab), Hosp Order     Status: None   Collection Time: 06/09/19  2:53 PM   Specimen: Nasopharyngeal Swab  Result Value Ref Range Status   SARS Coronavirus 2 NEGATIVE NEGATIVE Final    Comment: (NOTE) If result is NEGATIVE SARS-CoV-2 target nucleic acids are NOT  DETECTED. The SARS-CoV-2 RNA is generally detectable in upper and lower  respiratory specimens during the acute phase of infection. The lowest  concentration of SARS-CoV-2 viral copies this assay can detect is 250  copies / mL. A negative result does not preclude SARS-CoV-2 infection  and should not be used as the sole basis for treatment or other  patient management decisions.  A negative result may occur with  improper specimen collection / handling, submission of specimen other  than nasopharyngeal swab, presence of viral mutation(s) within the  areas targeted by this assay, and inadequate number of viral copies  (<250 copies / mL). A negative result must be combined with clinical  observations, patient history, and epidemiological information. If result is POSITIVE SARS-CoV-2 target nucleic acids are DETECTED. The SARS-CoV-2 RNA is generally detectable in upper and lower  respiratory specimens dur ing the acute phase of infection.  Positive  results are indicative of active infection with SARS-CoV-2.  Clinical  correlation with patient history and other diagnostic information is  necessary to  determine patient infection status.  Positive results do  not rule out bacterial infection or co-infection with other viruses. If result is PRESUMPTIVE POSTIVE SARS-CoV-2 nucleic acids MAY BE PRESENT.   A presumptive positive result was obtained on the submitted specimen  and confirmed on repeat testing.  While 2019 novel coronavirus  (SARS-CoV-2) nucleic acids may be present in the submitted sample  additional confirmatory testing may be necessary for epidemiological  and / or clinical management purposes  to differentiate between  SARS-CoV-2 and other Sarbecovirus currently known to infect humans.  If clinically indicated additional testing with an alternate test  methodology [phone]) is advised. The SARS-CoV-2 RNA is generally  detectable in upper and lower respiratory sp ecimens during  the acute  phase of infection. The expected result is Negative. Fact Sheet for Patients:  StrictlyIdeas.no Fact Sheet for Healthcare Providers: BankingDealers.co.za This test is not yet approved or cleared by the [place] FDA and has been authorized for detection and/or diagnosis of SARS-CoV-2 by FDA under an Emergency Use Authorization (EUA).  This EUA will remain in effect (meaning this test can be used) for the duration of the COVID-19 declaration under Section 564(b)(1) of the Act, 21 U.S.C. section 360bbb-3(b)(1), unless the authorization is terminated or revoked sooner. Performed at [facility], [place] [address] [place], [place] [address]   MRSA PCR Screening     Status: None   Collection Time: 06/09/19  6:40 PM   Specimen: Nasal Mucosa; Nasopharyngeal  Result Value Ref Range Status   MRSA by PCR NEGATIVE NEGATIVE Final    Comment:        The GeneXpert MRSA Assay (FDA approved for NASAL specimens only), is one component of a comprehensive MRSA colonization surveillance program. It is not intended to diagnose MRSA infection nor to guide or monitor treatment for MRSA infections. Performed at [facility], [place] [address] [place], [place] [address]          Radiology Studies: Ct Abdomen Pelvis Wo Contrast  Result Date: 06/09/2019 CLINICAL DATA:  Abdominal pain and low back pain for 2 months. EXAM: CT ABDOMEN AND PELVIS WITHOUT CONTRAST TECHNIQUE: Multidetector CT imaging of the abdomen and pelvis was performed following the standard protocol without IV contrast. COMPARISON:  None. FINDINGS: Lower chest: Clear lung bases.  Heart normal in size Hepatobiliary: Normal liver. Few small stones in the gallbladder fundus. No gallbladder wall thickening or inflammation. No bile duct dilation Pancreas: Unremarkable. No pancreatic ductal dilatation or surrounding inflammatory changes. Spleen:  Normal in size without focal abnormality. Adrenals/Urinary Tract: Adrenal glands are unremarkable. Kidneys are normal, without renal calculi, focal lesion, or hydronephrosis. Bladder is unremarkable. Stomach/Bowel: Stomach is within normal limits. Appendix appears normal. No evidence of bowel wall thickening, distention, or inflammatory changes. Vascular/Lymphatic: Aortic atherosclerotic calcifications. No aneurysm. No enlarged lymph nodes. Reproductive: Uterus and bilateral adnexa are unremarkable. Other: No abdominal wall hernia or abnormality. No abdominopelvic ascites. Musculoskeletal: No fracture or acute finding. No osteoblastic or osteolytic lesions. Degenerative changes noted of the visualized spine. IMPRESSION: 1. No acute findings. 2. Degenerative changes of the lumbar spine may account for patient's low back pain 3. Small gallstones.  No acute cholecystitis. 4. Aortic atherosclerosis. Electronically Signed   By: Lajean Manes M.D.   On: 06/09/2019 15:03   Dg Chest 2 View  Result Date: 06/09/2019 CLINICAL DATA:  Nausea, vomiting and abdominal pain. EXAM: CHEST - 2 VIEW COMPARISON:  None. FINDINGS: Cardiac silhouette is normal in size. Normal mediastinal  and hilar contours. Clear lungs.  No pleural effusion or pneumothorax. Skeletal structures are intact. IMPRESSION: No active cardiopulmonary disease. Electronically Signed   By: Lajean Manes M.D.   On: 06/09/2019 15:04   US Abdomen Limited Ruq  Result Date: 06/09/2019 CLINICAL DATA:  Elevated LFT EXAM: ULTRASOUND ABDOMEN LIMITED RIGHT UPPER QUADRANT COMPARISON:  CT abdomen pelvis 06/09/2019 FINDINGS: Gallbladder: Small gallstones. Gallbladder sludge. Probable 4 mm gallbladder polyp. Gallbladder wall non thickened. Negative sonographic Murphy sign. Common bile duct: Diameter: 2.9 mm Liver: No focal lesion identified. Within normal limits in parenchymal echogenicity. Portal vein is patent on color Doppler imaging with normal direction of blood flow  towards the liver. IMPRESSION: Cholelithiasis and gallbladder sludge.  Negative for cholecystitis. Electronically Signed   By: Franchot Gallo M.D.   On: 06/09/2019 18:32        Scheduled Meds: . amLODipine  10 mg Oral Daily  . Chlorhexidine Gluconate Cloth  6 each Topical Daily  . enoxaparin (LOVENOX) injection  40 mg Subcutaneous Q24H  . insulin aspart  0-15 Units Subcutaneous TID AC & HS  . insulin glargine  10 Units Subcutaneous Daily  . polyethylene glycol  17 g Oral Daily   Continuous Infusions: . sodium chloride Stopped (06/09/19 1942)  . dextrose 5 % and 0.45% NaCl Stopped (06/10/19 0511)  . insulin Stopped (06/10/19 0512)     LOS: 1 day    Time spent: 35 minutes.     Elmarie Shiley, MD [facility] Pager [phone]  If 7PM-7AM, please contact night-coverage [email or web address] Password [email or web address] 06/10/2019, 7:09 AM

## 2019-06-10 NOTE — Evaluation (Signed)
Physical Therapy Evaluation Patient Details Name: Maria Robinson MRN: [id number] DOB: 09-10-1956 Today's Date: 06/10/2019   History of Present Illness  62 yo female adm with  Abdominal pain, lower back pain, generalized weakness, Found to have acute kidney injury, hyponatremia, hyperglycemia with DKA, elevated liver enzymes.  Started on IV fluids, initiated DKA protocol.  COVID-19 screening test negative.  Clinical Impression  Pt admitted with above diagnosis. Pt currently with functional limitations due to the deficits listed below (see PT Problem List).  Pt too fatigued to amb today but agreeable to OOB to chair and only requiring min/guard assist. VSS. Anticipate pt will progress well and should not require f/u PT post acute   Pt will benefit from skilled PT to increase their independence and safety with mobility to allow discharge to the venue listed below.     Follow Up Recommendations No PT follow up    Equipment Recommendations  None recommended by PT    Recommendations for Other Services       Precautions / Restrictions Precautions Precautions: Fall Restrictions Weight Bearing Restrictions: No      Mobility  Bed Mobility Overal bed mobility: Needs Assistance Bed Mobility: Supine to Sit     Supine to sit: Supervision     General bed mobility comments: for lines and safety  Transfers Overall transfer level: Needs assistance Equipment used: None Transfers: Sit to/from Stand;Stand Pivot Transfers Sit to Stand: Min guard Stand pivot transfers: Min guard       General transfer comment: pivotal steps to chair with min/guard assist for safety and line management, pt felt too fatigued to attempt further mobility  Ambulation/Gait                Stairs            Wheelchair Mobility    Modified Rankin (Stroke Patients Only)       Balance Overall balance assessment: Needs assistance           Standing balance-Leahy Scale: Fair Standing  balance comment: pt reports she has experienced  LOB multiple times recently but no falls                             Pertinent Vitals/Pain      Home Living Family/patient expects to be discharged to:: Private residence Living Arrangements: Alone Available Help at Discharge: Family Type of Home: House       Home Layout: One level Home Equipment: None      Prior Function Level of Independence: Independent         Comments: pt very independent at her baseline, working until being laid off d/t pandemic     Hand Dominance        Extremity/Trunk Assessment   Upper Extremity Assessment Upper Extremity Assessment: Overall WFL for tasks assessed    Lower Extremity Assessment Lower Extremity Assessment: Overall WFL for tasks assessed       Communication   Communication: No difficulties  Cognition Arousal/Alertness: Awake/alert Behavior During Therapy: WFL for tasks assessed/performed Overall Cognitive Status: Within Functional Limits for tasks assessed                                        General Comments      Exercises     Assessment/Plan    PT Assessment Patient needs continued PT  services  PT Problem List Decreased activity tolerance;Decreased balance;Decreased mobility       PT Treatment Interventions DME instruction;Therapeutic activities;Gait training;Functional mobility training;Therapeutic exercise;Patient/family education    PT Goals (Current goals can be found in the Care Plan section)  Acute Rehab PT Goals PT Goal Formulation: With patient Time For Goal Achievement: 06/23/19 Potential to Achieve Goals: Good    Frequency Min 3X/week   Barriers to discharge        Co-evaluation               AM-PAC PT "6 Clicks" Mobility  Outcome Measure Help needed turning from your back to your side while in a flat bed without using bedrails?: A Little Help needed moving from lying on your back to sitting on the  side of a flat bed without using bedrails?: A Little Help needed moving to and from a bed to a chair (including a wheelchair)?: A Little Help needed standing up from a chair using your arms (e.g., wheelchair or bedside chair)?: A Little Help needed to walk in hospital room?: A Little Help needed climbing 3-5 steps with a railing? : A Lot 6 Click Score: 17    End of Session   Activity Tolerance: Patient tolerated treatment well;Patient limited by fatigue Patient left: in chair;with call bell/phone within reach   PT Visit Diagnosis: Unsteadiness on feet (R26.81)    Time: 7673-4193 PT Time Calculation (min) (ACUTE ONLY): 17 min   Charges:   PT Evaluation $PT Eval Low Complexity: 1 Low          Kenyon Ana, PT  Pager: [phone] Acute Rehab Dept [facility]): [phone]   06/10/2019   [facility] 06/10/2019, 2:52 PM

## 2019-06-10 NOTE — Consult Note (Addendum)
Referring Provider:  [facility]         Primary Care Physician:  Patient, No Pcp Per Primary Gastroenterologist:  unassigned           Reason for Consultation:   Abnormal liver tests / abdominal pain / nausea / vomiting and dysphagia               ASSESSMENT /  PLAN    1. 63 yo female admitted with abdominal pain, ongoing since at least early May when admitted to [facility] with abdominal pain and abnormal liver tests (hepatocellular injury pattern) but bilirubin was 10. Liver tests nearly normalized by late May but now with recurrent elevation in transaminases. She has persistent abdominal pain but it is LOWER pain. Gallbladder sludge / stones on [place] and non-contrast CT scan. No evidence for cholecystitis nor cholelithiasis. Lipase elevated at 156. No radiologic evidence for pancreatitis on non-contrast CTscan.  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 Interestingly it was negative at [facility] in early May when liver tests were markedly abnormal ( in the window?). Normal appearing liver on non-contrast CTAP.Will obtain HCV viral load  -Her transaminases are up again but only in the low 300's this time. Abnormal liver tests this time could be related to Azithromycin she took in mid to late May.  -No evidence for acute liver failure. Check INR  2. Lower abdominal pain. Sometimes better with BM, not related to eating. Takes Miralax and had daily BM so doesn't feel constipated. No bowel abnormalities on non-contrast CT scan.   3. Microcytosis, possibly with anemia as hgb down to 10.2 today (though after IV fluids) -check anemia panel  4. Dysphagia / weight loss. Everything she eats and now drinks had problems going down. She vomits food immediately after eating.  -Barium swallow vrs EGD. If iron deficient then will need EGD anyway, along with colonoscopy at some point.      Attending Physician Note   I have taken a history, examined the patient and reviewed the chart. I agree with the Advanced  Practitioner's note, impression and recommendations.   * Elevated LFTs with recent hepatocellular insult at [facility] without a clear etiology. Transaminases, t bil have bumped again. HCV Ab now positive. Check HCV DNA, ANA. R/O DILI (? azithromycin) * Lower abdominal pain and constipation. Miralax qd and start dicyclomine ac & hs  * Cholelithiasis, appears to be asymptomatic at this time. Doubt choledocholithiasis with no biliary dilation.  * Microcytic anemia. Check iron studies, B12, folate  * Dysphagia vs nausea and weight loss. Barium esophagram, EGD   Lucio Edward, MD [facility] [phone]     BRIEF HISTORY   Maria Robinson is a 63 y.o. female with PMH of HTN, DM  Patient presented to ED yesterday with multiple complaints. Admitted with DKA, AKI, hyponatremia, abdominal pain, N/V, abnormal liver tests   ED EVALUATION   Hemodynamically stable COVID negative Na+ 126 >>>> now 135 BUN 28, Cr 1.01 WBC 13.1, hgb 13.3 >>>> 10.2 today MCV 73.8 Alk phos 142, AST 305, ALT 312, tbili 2.6, albumin 4.2 Lipase 156   HPI:   I reviewed records in Care Everywhere. Patient was admitted to [facility] in May for abdominal pain. H.pylori antigen negative. Pain resolved with treatment of constipation. She was found to have abnormal liver tests. On 04/25/19 her Tbili was 11, alk phos 333, AST 1461, ALT 839. Lipase was 109. RUQ was unremarkable. Liver tests trended down and by 05/19/19 outpatient repeat labs were:  alk phos 191, t bili 2.2, AST 70 and ALT 153.  Patient seen outpatient 05/05/19 by Pulmonary for COVID symptoms but test was negative.   Her alk phos and bilirubin have continued to trend down however transaminases have risen from significantly again.    Past Medical History:  Diagnosis Date   Hypertension    Low blood potassium     Past Surgical History:  Procedure Laterality Date   CESAREAN SECTION     x 3    Prior to Admission medications   Medication Sig Start Date End Date  Taking? Authorizing Provider  amLODipine (NORVASC) 10 MG tablet Take 10 mg by mouth daily. 05/09/19  Yes [provider]  cetirizine (ZYRTEC) 5 MG tablet Take 5 mg by mouth daily.   Yes [provider]  hydrochlorothiazide (HYDRODIURIL) 25 MG tablet Take 25 mg by mouth daily. 05/26/19  Yes [provider]  KLOR-CON M10 10 MEQ tablet Take 10 mEq by mouth daily. 05/10/19  Yes [provider]  polyethylene glycol powder (GLYCOLAX/MIRALAX) 17 GM/SCOOP powder Take 17 g by mouth 2 (two) times daily as needed for constipation. 04/27/19  Yes [provider]  ondansetron (ZOFRAN) 4 MG tablet Take 1 tablet (4 mg total) by mouth every 6 (six) hours. Patient not taking: Reported on 06/09/2019 10/10/14   Britt Bottom, NP    Current Facility-Administered Medications  Medication Dose Route Frequency Provider Last Rate Last Dose   0.9 %  sodium chloride infusion   Intravenous Continuous Regalado, Belkys A, MD 100 mL/hr at 06/10/19 0944     0.9 %  sodium chloride infusion   Intravenous PRN Regalado, Belkys A, MD 10 mL/hr at 06/10/19 0943     amLODipine (NORVASC) tablet 5 mg  5 mg Oral Daily Regalado, Belkys A, MD   Stopped at 06/10/19 1021   Chlorhexidine Gluconate Cloth 2 % PADS 6 each  6 each Topical Daily Adhikari, Tamsen Meek, MD   6 each at 06/09/19 2131   dextrose 50 % solution 25 mL  25 mL Intravenous PRN Shelly Coss, MD       enoxaparin (LOVENOX) injection 40 mg  40 mg Subcutaneous Q24H Adhikari, Amrit, MD   40 mg at 06/09/19 2248   insulin aspart (novoLOG) injection 0-15 Units  0-15 Units Subcutaneous TID AC & HS Fransico Meadow, PA-C   5 Units at 06/10/19 0741   insulin glargine (LANTUS) injection 10 Units  10 Units Subcutaneous Daily Regalado, Belkys A, MD   10 Units at 06/10/19 1021   ondansetron (ZOFRAN) injection 4 mg  4 mg Intravenous Q6H PRN Adhikari, Amrit, MD       pantoprazole (PROTONIX) injection 40 mg  40 mg Intravenous Q12H Regalado,  Belkys A, MD   40 mg at 06/10/19 1021   polyethylene glycol (MIRALAX / GLYCOLAX) packet 17 g  17 g Oral Daily Shelly Coss, MD   Stopped at 06/10/19 1021   potassium chloride 10 mEq in 100 mL IVPB  10 mEq Intravenous Q1 Hr x 3 Regalado, Belkys A, MD 100 mL/hr at 06/10/19 0944 10 mEq at 06/10/19 0944    Allergies as of 06/09/2019 - Review Complete 06/09/2019  Allergen Reaction Noted   Penicillins Swelling and Rash 10/10/2014    Family History  Problem Relation Age of Onset   Diabetes Father     Social History   Socioeconomic History   Marital status: Single    Spouse name: Not on file   Number of children: Not on  file   Years of education: Not on file   Highest education level: Not on file  Occupational History   Not on file  Social Needs   Financial resource strain: Not on file   Food insecurity    Worry: Not on file    Inability: Not on file   Transportation needs    Medical: Not on file    Non-medical: Not on file  Tobacco Use   Smoking status: Never Smoker   Smokeless tobacco: Never Used  Substance and Sexual Activity   Alcohol use: No   Drug use: No   Sexual activity: Not on file  Lifestyle   Physical activity    Days per week: Not on file    Minutes per session: Not on file   Stress: Not on file  Relationships   Social connections    Talks on phone: Not on file    Gets together: Not on file    Attends religious service: Not on file    Active member of club or organization: Not on file    Attends meetings of clubs or organizations: Not on file    Relationship status: Not on file   Intimate partner violence    Fear of current or ex partner: Not on file    Emotionally abused: Not on file    Physically abused: Not on file    Forced sexual activity: Not on file  Other Topics Concern   Not on file  Social History Narrative   Not on file    Review of Systems: All systems reviewed and negative except where noted in  HPI.  Physical Exam: Vital signs in last 24 hours: Temp:  [98.4 F (36.9 C)-98.8 F (37.1 C)] 98.5 F (36.9 C) (06/19 0802) Pulse Rate:  [71-108] 89 (06/19 0800) Resp:  [14-24] 19 (06/19 0800) BP: (99-148)/(56-107) 104/58 (06/19 0700) SpO2:  [95 %-100 %] 99 % (06/19 0800) Weight:  [62.2 kg-64 kg] 62.2 kg (06/18 1700) Last BM Date: 06/07/19 General:   Alert, well-developed,  female in NAD Psych:  Pleasant, cooperative. Normal mood and affect. Eyes:  Pupils equal, sclera clear, no icterus.   Conjunctiva pink. Ears:  Normal auditory acuity. Nose:  No deformity, discharge,  or lesions. Neck:  Supple; no masses Lungs:  Clear throughout to auscultation.   No wheezes, crackles, or rhonchi.  Heart:  Regular rate and rhythm; no murmurs, no lower extremity edema Abdomen:  Soft, non-distended, nontender, BS active, no palp mass    Rectal:  Deferred  Msk:  Symmetrical without gross deformities. . Neurologic:  Alert and  oriented x4;  grossly normal neurologically. Skin:  Intact without significant lesions or rashes.   Intake/Output from previous day: 06/18 0701 - 06/19 0700 In: 1379.2 [I.V.:1329.9; IV Piggyback:49.3] Out: 3100 [Urine:3100] Intake/Output this shift: Total I/O In: 91.3 [I.V.:91.3] Out: -   Lab Results: Recent Labs    06/09/19 1234 06/10/19 0036  WBC 13.1* 13.1*  HGB 13.3 10.2*  HCT 41.3 31.9*  PLT 271 208   BMET Recent Labs    06/09/19 1838 06/09/19 2041 06/10/19 0036  NA 135 138 135  K 4.2 3.6 3.3*  CL 100 103 103  CO2 21* 25 23  GLUCOSE 299* 195* 152*  BUN 28* 26* 19  CREATININE 1.01* 0.79 0.73  CALCIUM 8.0* 8.1* 7.9*   LFT Recent Labs    06/10/19 0036  PROT 6.3*  ALBUMIN 3.3*  AST 353*  ALT 337*  ALKPHOS 108  BILITOT 1.5*  PT/INR No results for input(s): LABPROT, INR in the last 72 hours. Hepatitis Panel Recent Labs    06/09/19 1838  HEPBSAG Negative  HCVAB >11.0*  HEPAIGM Negative  HEPBIGM Negative     . CBC Latest Ref  Rng & Units 06/10/2019 06/09/2019 10/10/2014  WBC 4.0 - 10.5 K/uL 13.1(H) 13.1(H) 11.3(H)  Hemoglobin 12.0 - 15.0 g/dL 10.2(L) 13.3 15.5(H)  Hematocrit 36.0 - 46.0 % 31.9(L) 41.3 45.6  Platelets 150 - 400 K/uL 208 271 380    . CMP Latest Ref Rng & Units 06/10/2019 06/09/2019 06/09/2019  Glucose 70 - 99 mg/dL 152(H) 195(H) 299(H)  BUN 8 - 23 mg/dL 19 26(H) 28(H)  Creatinine 0.44 - 1.00 mg/dL 0.73 0.79 1.01(H)  Sodium 135 - 145 mmol/L 135 138 135  Potassium 3.5 - 5.1 mmol/L 3.3(L) 3.6 4.2  Chloride 98 - 111 mmol/L 103 103 100  CO2 22 - 32 mmol/L 23 25 21(L)  Calcium 8.9 - 10.3 mg/dL 7.9(L) 8.1(L) 8.0(L)  Total Protein 6.5 - 8.1 g/dL 6.3(L) - -  Total Bilirubin 0.3 - 1.2 mg/dL 1.5(H) - -  Alkaline Phos 38 - 126 U/L 108 - -  AST 15 - 41 U/L 353(H) - -  ALT 0 - 44 U/L 337(H) - -   Studies/Results: Ct Abdomen Pelvis Wo Contrast  Result Date: 06/09/2019 CLINICAL DATA:  Abdominal pain and low back pain for 2 months. EXAM: CT ABDOMEN AND PELVIS WITHOUT CONTRAST TECHNIQUE: Multidetector CT imaging of the abdomen and pelvis was performed following the standard protocol without IV contrast. COMPARISON:  None. FINDINGS: Lower chest: Clear lung bases.  Heart normal in size Hepatobiliary: Normal liver. Few small stones in the gallbladder fundus. No gallbladder wall thickening or inflammation. No bile duct dilation Pancreas: Unremarkable. No pancreatic ductal dilatation or surrounding inflammatory changes. Spleen: Normal in size without focal abnormality. Adrenals/Urinary Tract: Adrenal glands are unremarkable. Kidneys are normal, without renal calculi, focal lesion, or hydronephrosis. Bladder is unremarkable. Stomach/Bowel: Stomach is within normal limits. Appendix appears normal. No evidence of bowel wall thickening, distention, or inflammatory changes. Vascular/Lymphatic: Aortic atherosclerotic calcifications. No aneurysm. No enlarged lymph nodes. Reproductive: Uterus and bilateral adnexa are unremarkable.  Other: No abdominal wall hernia or abnormality. No abdominopelvic ascites. Musculoskeletal: No fracture or acute finding. No osteoblastic or osteolytic lesions. Degenerative changes noted of the visualized spine. IMPRESSION: 1. No acute findings. 2. Degenerative changes of the lumbar spine may account for patient's low back pain 3. Small gallstones.  No acute cholecystitis. 4. Aortic atherosclerosis. Electronically Signed   By: Lajean Manes M.D.   On: 06/09/2019 15:03   Dg Chest 2 View  Result Date: 06/09/2019 CLINICAL DATA:  Nausea, vomiting and abdominal pain. EXAM: CHEST - 2 VIEW COMPARISON:  None. FINDINGS: Cardiac silhouette is normal in size. Normal mediastinal and hilar contours. Clear lungs.  No pleural effusion or pneumothorax. Skeletal structures are intact. IMPRESSION: No active cardiopulmonary disease. Electronically Signed   By: Lajean Manes M.D.   On: 06/09/2019 15:04   US Abdomen Limited Ruq  Result Date: 06/09/2019 CLINICAL DATA:  Elevated LFT EXAM: ULTRASOUND ABDOMEN LIMITED RIGHT UPPER QUADRANT COMPARISON:  CT abdomen pelvis 06/09/2019 FINDINGS: Gallbladder: Small gallstones. Gallbladder sludge. Probable 4 mm gallbladder polyp. Gallbladder wall non thickened. Negative sonographic Murphy sign. Common bile duct: Diameter: 2.9 mm Liver: No focal lesion identified. Within normal limits in parenchymal echogenicity. Portal vein is patent on color Doppler imaging with normal direction of blood flow towards the liver. IMPRESSION: Cholelithiasis and gallbladder sludge.  Negative for cholecystitis. Electronically Signed   By: Franchot Gallo M.D.   On: 06/09/2019 18:32     Tye Savoy, NP-C @  06/10/2019, 11:14 AM

## 2019-06-10 NOTE — Progress Notes (Signed)
Inpatient Diabetes Program Recommendations  AACE/ADA: New Consensus Statement on Inpatient Glycemic Control (2015)  Target Ranges:  Prepandial:   less than 140 mg/dL      Peak postprandial:   less than 180 mg/dL (1-2 hours)      Critically ill patients:  140 - 180 mg/dL   Lab Results  Component Value Date   GLUCAP 180 (H) 06/10/2019   HGBA1C 12.1 (H) 06/09/2019    Review of Glycemic Control  Diabetes history: New-onset Outpatient Diabetes medications: None Current orders for Inpatient glycemic control: Lantus 10 units QD, Novolog 0-15 units tidwc  HgbA1C - 12.1% Transitioned off IV insulin drip. CBGs today - 173, 216, 180 mg/dL NPO  Inpatient Diabetes Program Recommendations:     Change Novolog to Q4H if pt continues to be NPO.  Spoke with pt about new diagnosis of diabetes. Discussed A1C results (12.1%) and explained what an A1C is and informed patient that his current A1C indicates an average glucose of 300 mg/dl over the past 2-3 months. Discussed basic pathophysiology of DM Type 2, basic home care, importance of checking CBGs and maintaining good CBG control to prevent long-term and short-term complications. Reviewed glucose and A1C goals and explained that patient will need to continue to check blood sugars at home and f/u with PCP for diabetes management. Pt states she will be getting new PCP here in [place] at [place]. Discussed impact of nutrition, exercise, stress, sickness, and medications on diabetes control. Reviewed Living Well with diabetes booklet and encouraged patient to read through entire book and write down any questions she might have. Showed pt the insulin pen and pt states she prefers pen instead of vial/syringe for home. Discussed diet in detail - pt drinking Mountain Dews and juice, prior to diagnosis, but states she will have no trouble leaving these off. Talked about adding fiber to diet, and eating variety of foods with moderate portions. Pt states she has always been  a pretty healthy eater.   Will closely monitor blood sugar trends.   Thank you. Lorenda Peck, RD, LDN, CDE Inpatient Diabetes Coordinator [phone]

## 2019-06-10 NOTE — Discharge Instructions (Signed)
CARBOHYDRATE COUNTING FOR PEOPLE WITH DIABETES  Why Is Carbohydrate Counting Important?  Counting carbohydrate servings may help you control your blood glucose level so that you feel better.  The balance between the carbohydrates you eat and insulin determines what your blood glucose level will be after eating.  Carbohydrate counting can also help you plan your meals.  Which Foods Have Carbohydrates? Foods with carbohydrates include:  Breads, crackers, and cereals  Pasta, rice, and grains  Starchy vegetables, such as potatoes, corn, and peas  Beans and legumes  Milk, soy milk, and yogurt  Fruits and fruit juices  Sweets, such as cakes, cookies, ice cream, jam, and jelly  Carbohydrate Servings In diabetes meal planning, 1 serving of a food with carbohydrate has about 15 grams of carbohydrate:  Check serving sizes with measuring cups and spoons or a food scale.  Read the Nutrition Facts on food labels to find out how many grams of carbohydrate are in foods you eat. The food lists in this handout show portions that have about 15 grams of carbohydrate.   Meal Planning Tips  An Eating Plan tells you how many carbohydrate servings to eat at your meals and snacks. For many adults, eating 3 to 5 servings of carbohydrate foods at each meal and 1 or 2 carbohydrate servings for each snack works well.  In a healthy daily Eating Plan, most carbohydrates come from: ? At least 6 servings of fruits and nonstarchy vegetables ? At least 6 servings of grains, beans, and starchy vegetables, with at least 3 servings from whole grains ? At least 2 servings of milk or milk products  Check your blood glucose level regularly. It can tell you if you need to adjust when you eat carbohydrates.  Eating foods that have fiber, such as whole grains, and having very few salty foods is good for your health.  Eat 4 to 6 ounces of meat or other protein foods (such as soybean burgers) each day.  Choose low-fat sources of protein, such as lean beef, lean pork, chicken, fish, low-fat cheese, or vegetarian foods such as soy.  Eat some healthy fats, such as olive oil, canola oil, and nuts.  Eat very little saturated fats. These unhealthy fats are found in butter, cream, and high-fat meats, such as bacon and sausage.  Eat very little or no trans fats. These unhealthy fats are found in all foods that list partially hydrogenated oil as an ingredient.  Label Reading Tips The Nutrition Facts panel on a label lists the grams of total carbohydrate in1 standard serving. The labels standard serving may be larger or smaller than 1 carbohydrate serving. To figure out how many carbohydrate servings are in the food:  First, look at the labels standard serving size.  Check the grams of total carbohydrate. This is the amount of carbohydrate in 1 standard serving.  Divide the grams of total carbohydrate by 15. This number equals the number of carbohydrate servings in 1 standard serving. Remember: 1 carbohydrate serving is 15 grams of carbohydrate.  Note: You may ignore the grams of sugars on the Nutrition Facts panel because they are included in the grams of total carbohydrate.  Foods Recommended 1 serving = about 15 grams of carbohydrate  Starches  1 slice bread (1 ounce)  1 tortilla (6-inch size)   large bagel (1 ounce)  2 taco shells (5-inch size)   hamburger or hot dog bun ( ounce)   cup ready-to-eat unsweetened cereal   cup cooked cereal  1 cup broth-based soup  4 to 6 small crackers  1/3 cup pasta or rice (cooked)   cup beans, peas, corn, sweet potatoes, winter squash, or mashed or boiled potatoes (cooked)   large baked potato (3 ounces)   ounce pretzels, potato chips, or tortilla chips  3 cups popcorn (popped)  Fruit  1 small fresh fruit ( to 1 cup)   cup canned or frozen fruit  2 tablespoons dried fruit (blueberries, cherries, cranberries,  mixed fruit, raisins)  17 small grapes (3 ounces)  1 cup melon or berries   cup unsweetened fruit juice  Milk  1 cup fat-free or reduced-fat milk  1 cup soy milk  2/3 cup (6 ounces) nonfat yogurt sweetened with sugar-free sweetener   Sweets and Desserts  2-inch square cake (unfrosted)  2 small cookies (2/3 ounce)   cup ice cream or frozen yogurt   cup sherbet or sorbet  1 tablespoon syrup, jam, jelly, table sugar, or honey  2 tablespoons light syrup  Other Foods  Count 1 cup raw vegetables or  cup cooked nonstarchy vegetables as zero (0) carbohydrate servings or free foods. If you eat 3 or more servings at one meal, count them as 1 carbohydrate serving.  Foods that have less than 20 calories in each serving also may be counted as zero carbohydrate servings or free foods.  Count 1 cup of casserole or other mixed foods as 2 carbohydrate servings.     Carbohydrate Counting for People with Diabetes Sample 1-Day Menu Breakfast 1 extra-small banana (1 carbohydrate serving) 1 cup low-fat or fat-free milk (1 carbohydrate serving) 1 slice whole wheat bread (1 carbohydrate serving) 1 teaspoon margarine  Lunch 2 ounces [place] slices 2 slices whole wheat bread (2 carbohydrate servings) 2 lettuce leaves 4 celery sticks 4 carrot sticks 1 medium apple (1 carbohydrate serving) 1 cup low-fat or fat-free milk (1 carbohydrate serving)  Afternoon Snack 2 tablespoons raisins (1 carbohydrate serving) 3/4 ounce unsalted mini pretzels (1 carbohydrate serving)  Evening Meal 3 ounces lean roast beef  1/2 large baked potato (2 carbohydrate servings) 1 tablespoon reduced-fat sour cream 1/2 cup green beans 1 tablespoon light salad dressing 1 whole wheat dinner roll (1 carbohydrate serving) 1 teaspoon margarine 1 cup melon balls (1 carbohydrate serving)  Evening Snack 2 tablespoons unsalted nuts

## 2019-06-11 DIAGNOSIS — R7989 Other specified abnormal findings of blood chemistry: Secondary | ICD-10-CM

## 2019-06-11 DIAGNOSIS — R1030 Lower abdominal pain, unspecified: Secondary | ICD-10-CM

## 2019-06-11 LAB — COMPREHENSIVE METABOLIC PANEL
ALT: 362 U/L — ABNORMAL HIGH (ref 0–44)
AST: 347 U/L — ABNORMAL HIGH (ref 15–41)
Albumin: 2.9 g/dL — ABNORMAL LOW (ref 3.5–5.0)
Alkaline Phosphatase: 99 U/L (ref 38–126)
Anion gap: 9 (ref 5–15)
BUN: 8 mg/dL (ref 8–23)
CO2: 19 mmol/L — ABNORMAL LOW (ref 22–32)
Calcium: 8 mg/dL — ABNORMAL LOW (ref 8.9–10.3)
Chloride: 110 mmol/L (ref 98–111)
Creatinine, Ser: 0.77 mg/dL (ref 0.44–1.00)
GFR calc Af Amer: >60 mL/min (ref >60)
GFR calc non Af Amer: >60 mL/min (ref >60)
Glucose, Bld: 130 mg/dL — ABNORMAL HIGH (ref 70–99)
Potassium: 4.7 mmol/L (ref 3.5–5.1)
Sodium: 138 mmol/L (ref 135–145)
Total Bilirubin: 1.2 mg/dL (ref 0.3–1.2)
Total Protein: 5.4 g/dL — ABNORMAL LOW (ref 6.5–8.1)

## 2019-06-11 LAB — CBC WITH DIFFERENTIAL/PLATELET
Abs Immature Granulocytes: 0.03 10*3/uL (ref 0.00–0.07)
Basophils Absolute: 0.2 10*3/uL — ABNORMAL HIGH (ref 0.0–0.1)
Basophils Relative: 1 %
Eosinophils Absolute: 0.3 10*3/uL (ref 0.0–0.5)
Eosinophils Relative: 2 %
HCT: 30.3 % — ABNORMAL LOW (ref 36.0–46.0)
Hemoglobin: 9.7 g/dL — ABNORMAL LOW (ref 12.0–15.0)
Immature Granulocytes: 0 %
Lymphocytes Relative: 68 %
Lymphs Abs: 9.2 10*3/uL — ABNORMAL HIGH (ref 0.7–4.0)
MCH: 23.4 pg — ABNORMAL LOW (ref 26.0–34.0)
MCHC: 32 g/dL (ref 30.0–36.0)
MCV: 73.2 fL — ABNORMAL LOW (ref 80.0–100.0)
Monocytes Absolute: 1.1 10*3/uL — ABNORMAL HIGH (ref 0.1–1.0)
Monocytes Relative: 8 %
Neutro Abs: 2.8 10*3/uL (ref 1.7–7.7)
Neutrophils Relative %: 21 %
Platelets: 200 10*3/uL (ref 150–400)
RBC: 4.14 MIL/uL (ref 3.87–5.11)
RDW: 15.1 % (ref 11.5–15.5)
WBC: 13.6 10*3/uL — ABNORMAL HIGH (ref 4.0–10.5)
nRBC: 0 % (ref 0.0–0.2)

## 2019-06-11 LAB — GLUCOSE, CAPILLARY
Glucose-Capillary: 121 mg/dL — ABNORMAL HIGH (ref 70–99)
Glucose-Capillary: 97 mg/dL (ref 70–99)
Glucose-Capillary: 289 mg/dL — ABNORMAL HIGH (ref 70–99)
Glucose-Capillary: 56 mg/dL — ABNORMAL LOW (ref 70–99)
Glucose-Capillary: 101 mg/dL — ABNORMAL HIGH (ref 70–99)

## 2019-06-11 NOTE — Progress Notes (Addendum)
[facility] Progress Note  CC:  Elevated LFTs, dysphagia   Subjective: No abdominal pain. No N/V. Remains NPO. She passed a large brown soft BM this am, no blood. A little abdominal bloat. Swallowing water and pills without difficulty. At home, she had epigastric pain and vomited after eating most days since May 2020.  Objective:  Vital signs in last 24 hours: Temp:  [98.1 F (36.7 C)-98.8 F (37.1 C)] 98.1 F (36.7 C) (06/20 0522) Pulse Rate:  [76-91] 91 (06/20 0522) Resp:  [18-24] 18 (06/20 0522) BP: (118-159)/(66-93) 140/79 (06/20 0522) SpO2:  [99 %-100 %] 100 % (06/20 0522) Weight:  [86.2 kg] 86.2 kg (06/20 0522) Last BM Date: 06/07/19 General:   Alert,  Well-developed,    in NAD Heart: RRR, no murmur. Pulm: Lungs clear throughout. Abdomen: Soft, nontender, + BS x 4 quads, lower midline scar intact. Extremities:  Without edema. Neurologic:  Alert and  oriented x4;  grossly normal neurologically. Psych:  Alert and cooperative. Normal mood and affect.  Intake/Output from previous day: 06/19 0701 - 06/20 0700 In: 2240.7 [I.V.:2140.7; IV Piggyback:100] Out: -  Intake/Output this shift: No intake/output data recorded.  Lab Results: Recent Labs    06/09/19 1234 06/10/19 0036 06/11/19 0756  WBC 13.1* 13.1* 13.6*  HGB 13.3 10.2* 9.7*  HCT 41.3 31.9* 30.3*  PLT 271 208 200   BMET Recent Labs    06/09/19 2041 06/10/19 0036 06/11/19 0756  NA 138 135 138  K 3.6 3.3* 4.7  CL 103 103 110  CO2 25 23 19*  GLUCOSE 195* 152* 130*  BUN 26* 19 8  CREATININE 0.79 0.73 0.77  CALCIUM 8.1* 7.9* 8.0*   LFT Recent Labs    06/11/19 0756  PROT 5.4*  ALBUMIN 2.9*  AST 347*  ALT 362*  ALKPHOS 99  BILITOT 1.2   PT/INR Recent Labs    06/10/19 1320  LABPROT 12.5  INR 0.9   Hepatitis Panel Recent Labs    06/09/19 1838  HEPBSAG Negative  HCVAB >11.0*  HEPAIGM Negative  HEPBIGM Negative    Ct Abdomen Pelvis Wo Contrast  Result Date:  06/09/2019 CLINICAL DATA:  Abdominal pain and low back pain for 2 months. EXAM: CT ABDOMEN AND PELVIS WITHOUT CONTRAST TECHNIQUE: Multidetector CT imaging of the abdomen and pelvis was performed following the standard protocol without IV contrast. COMPARISON:  None. FINDINGS: Lower chest: Clear lung bases.  Heart normal in size Hepatobiliary: Normal liver. Few small stones in the gallbladder fundus. No gallbladder wall thickening or inflammation. No bile duct dilation Pancreas: Unremarkable. No pancreatic ductal dilatation or surrounding inflammatory changes. Spleen: Normal in size without focal abnormality. Adrenals/Urinary Tract: Adrenal glands are unremarkable. Kidneys are normal, without renal calculi, focal lesion, or hydronephrosis. Bladder is unremarkable. Stomach/Bowel: Stomach is within normal limits. Appendix appears normal. No evidence of bowel wall thickening, distention, or inflammatory changes. Vascular/Lymphatic: Aortic atherosclerotic calcifications. No aneurysm. No enlarged lymph nodes. Reproductive: Uterus and bilateral adnexa are unremarkable. Other: No abdominal wall hernia or abnormality. No abdominopelvic ascites. Musculoskeletal: No fracture or acute finding. No osteoblastic or osteolytic lesions. Degenerative changes noted of the visualized spine. IMPRESSION: 1. No acute findings. 2. Degenerative changes of the lumbar spine may account for patient's low back pain 3. Small gallstones.  No acute cholecystitis. 4. Aortic atherosclerosis. Electronically Signed   By: Lajean Manes M.D.   On: 06/09/2019 15:03   Dg Chest 2 View  Result Date: 06/09/2019 CLINICAL DATA:  Nausea, vomiting and  abdominal pain. EXAM: CHEST - 2 VIEW COMPARISON:  None. FINDINGS: Cardiac silhouette is normal in size. Normal mediastinal and hilar contours. Clear lungs.  No pleural effusion or pneumothorax. Skeletal structures are intact. IMPRESSION: No active cardiopulmonary disease. Electronically Signed   By: Lajean Manes M.D.   On: 06/09/2019 15:04   US Abdomen Limited Ruq  Result Date: 06/09/2019 CLINICAL DATA:  Elevated LFT EXAM: ULTRASOUND ABDOMEN LIMITED RIGHT UPPER QUADRANT COMPARISON:  CT abdomen pelvis 06/09/2019 FINDINGS: Gallbladder: Small gallstones. Gallbladder sludge. Probable 4 mm gallbladder polyp. Gallbladder wall non thickened. Negative sonographic Murphy sign. Common bile duct: Diameter: 2.9 mm Liver: No focal lesion identified. Within normal limits in parenchymal echogenicity. Portal vein is patent on color Doppler imaging with normal direction of blood flow towards the liver. IMPRESSION: Cholelithiasis and gallbladder sludge.  Negative for cholecystitis. Electronically Signed   By: Franchot Gallo M.D.   On: 06/09/2019 18:32    Assessment / Plan:  1. 63 y.o. female with epigastric pain,  elevated LFTS and hyperbilirubinemia. Hepatitis C Ab +. Hep C RNA pending. CTAP 6/18  Showed small gallstones. Abdominal sono showed small gallstones, gallbladder sludge, probable 61m gallbladder polyp. CBD diameter 2.957m Portal vein is patent. Alk Phos 99. AST 347. ALT 362. T. Bili 1.2.  -follow hepatic panel - ? MRCP to further rule out intra and extrahepatic duct dilatation and CBD stones. -IVF per hospitalist   2. Anemia: Hg 9.7. HCT 30.3 down from 10.2. No signs of active GI bleeding. She passed a normal brown  BM this am. -monitor H/H -continue PPI  3. Leukocytosis: WBC 13.6. She is a febrile.  -Monitor CBC and temperature  4. Dysphagia. Nausea. Vomiting.  Will need EGD, unlikely done today. Ok to start clear liquid diet.  5. Lower abdominal pain, constipation. No further lower abdominal pain.  -continue Miralax and dicyclomine     Further recommendations per Dr. StFuller Plan LOS: 2 days   CoNoralyn PickP 06/11/2019, 11:54 AM      Attending Physician Note   I have taken an interval history, reviewed the chart and examined the patient. I agree with the Advanced Practitioner's  note, impression and recommendations.   Elevated LFTs, HCV RNA and ANA are pending Constipation and lower abdominal pain improved with Miralax and dicyclomine Anemia, not iron deficient Dysphagia, nausea, vomiting improved. Continue PPI. Plan for EGD on Monday.   MaLucio EdwardMD FACG ([phone]

## 2019-06-11 NOTE — Progress Notes (Signed)
Hypoglycemic Event  CBG: 56  Treatment: Patient on Clear Liquid Diet, patient given cran berrry juice and jello.  Symptoms: patient denies any symptoms related to hypoglycemia.  Follow-up CBG: Time:2140 CBG Result:101  Possible Reasons for Event:   Comments/MD notified:  Hypoglycemia protocol in place, followed and carried out. We will continue to monitor.    Maria Robinson H

## 2019-06-11 NOTE — Plan of Care (Signed)
Patient tolerating clear liquid diet, no vomiting or abdominal pain.

## 2019-06-11 NOTE — Progress Notes (Signed)
Physical Therapy Treatment Patient Details Name: Maria Robinson MRN: [id number] DOB: 1956-08-19 Today's Date: 06/11/2019    History of Present Illness 63 yo female adm with  Abdominal pain, lower back pain, generalized weakness, Found to have acute kidney injury, hyponatremia, hyperglycemia with DKA, elevated liver enzymes.    PT Comments    Progressing well with mobility.    Follow Up Recommendations  No PT follow up     Equipment Recommendations  None recommended by PT    Recommendations for Other Services       Precautions / Restrictions Precautions Precautions: None Restrictions Weight Bearing Restrictions: No    Mobility  Bed Mobility               General bed mobility comments: oob in recliner  Transfers Overall transfer level: Needs assistance Equipment used: None Transfers: Sit to/from Stand Sit to Stand: Supervision         General transfer comment: for lines  Ambulation/Gait Ambulation/Gait assistance: Min guard Gait Distance (Feet): 200 Feet Assistive device: None Gait Pattern/deviations: Step-through pattern     General Gait Details: for safety. fair gait speed. No LOB.   Stairs             Wheelchair Mobility    Modified Rankin (Stroke Patients Only)       Balance Overall balance assessment: Mild deficits observed, not formally tested                                          Cognition Arousal/Alertness: Awake/alert Behavior During Therapy: WFL for tasks assessed/performed Overall Cognitive Status: Within Functional Limits for tasks assessed                                        Exercises      General Comments        Pertinent Vitals/Pain Pain Assessment: No/denies pain    Home Living                      Prior Function            PT Goals (current goals can now be found in the care plan section) Progress towards PT goals: Progressing toward goals     Frequency    Min 3X/week      PT Plan Current plan remains appropriate    Co-evaluation              AM-PAC PT "6 Clicks" Mobility   Outcome Measure  Help needed turning from your back to your side while in a flat bed without using bedrails?: A Little Help needed moving from lying on your back to sitting on the side of a flat bed without using bedrails?: A Little Help needed moving to and from a bed to a chair (including a wheelchair)?: A Little Help needed standing up from a chair using your arms (e.g., wheelchair or bedside chair)?: A Little Help needed to walk in hospital room?: A Little Help needed climbing 3-5 steps with a railing? : A Little 6 Click Score: 18    End of Session   Activity Tolerance: Patient tolerated treatment well Patient left: in chair;with call bell/phone within reach   PT Visit Diagnosis: Unsteadiness on feet (R26.81)     Time: 4259-5638  PT Time Calculation (min) (ACUTE ONLY): 8 min  Charges:  $Gait Training: 8-22 mins                        Weston Anna, PT Acute Rehabilitation Services Pager: [phone] Office: [phone]

## 2019-06-11 NOTE — Progress Notes (Addendum)
PROGRESS NOTE    Maria Robinson  RTM:[id number] DOB: September 22, 1956 DOA: 06/09/2019 PCP: Patient, No Pcp Per    Brief Narrative; 63 year old with past medical history significant for hypertension who present with multiple complaints.  Patient reports feeling weak, poor appetite.  Report of nausea and vomiting.  She has also been complaining of abdominal pain radiating to her back.  She also report increase thirsty and dry mouth.  She denies bowel movement since 3 days prior to admission.  Evaluation in the ED patient was found to have acute kidney injury, hyponatremia, hyperglycemia with DKA, elevated  liver enzymes.  Assessment & Plan:   Principal Problem:   DKA (diabetic ketoacidoses) ([place]) Active Problems:   DM (diabetes mellitus) ([place])   AKI (acute kidney injury) ([place])   Elevated liver enzymes   Hyponatremia   Muscle weakness (generalized)   1-DKA: New diagnosis for diabetes. Hemoglobin A1c:12 Patient was initially treated with IV fluids, insulin drip.  Her gap is close blood sugar has decreased.  She was transitioned to Lantus 10 units.  Continue with a sliding scale insulin. Diabetic  educator will discuss diet with patient Continue with fluids.   2-Transaminases; Abdominal pain, nausea vomiting.  Nausea vomiting could have be related to DKA.  But patient presents with mildly increased lipase and transaminases.  Gallbladder with cholelithiasis. Hepatitis C antibody positive.   quantitative hepatitis C level RNA pending.  GI has been consulted. Per GI  For abdominal pain started on Bentyl and miralax.   3-Dysphagia to liquids: GI consulted.  Started  PPI. Plan for endoscopy this admission.   4-hypertension: Continue with Norvasc.  5-Leukocytosis: Chest x-ray negative.  UA negative for infection.  Right upper quadrant ultrasound negative for cholecystitis.  Follow white blood cell trend. Stable. Monitor. Hold on antibiotics remain afebrile.   6-hypokalemia: Resolved.      Estimated body mass index is 38.38 kg/m as calculated from the following:   Height as of this encounter: '4\' 11"'  (1.499 m).   Weight as of this encounter: 86.2 kg.   DVT prophylaxis: SCDs Code Status: Full code Family Communication: Care discussed with patient.  I offered to update a family member, patient relate that she will update her family. Disposition Plan: Remain in the hospital for IV fluids, further evaluation of dysphagia and transaminases. Plan to start diet, she will need endoscopy prior to discharge  Consultants:   GI   Procedures:   Right upper quadrant ultrasound:Cholelithiasis and gallbladder sludge.  Negative for cholecystitis     Antimicrobials:   None   Subjective: Report abdominal pain has improved. Had BM. Denies vomiting.    Objective: Vitals:   06/10/19 1400 06/10/19 1646 06/10/19 2207 06/11/19 0522  BP: 118/72 (!) 139/93 (!) 159/81 140/79  Pulse: 78 80 83 91  Resp: '18 20 18 18  ' Temp:  98.8 F (37.1 C) 98.4 F (36.9 C) 98.1 F (36.7 C)  TempSrc:  Oral Oral Oral  SpO2: 100% 100% 100% 100%  Weight:    86.2 kg  Height:        Intake/Output Summary (Last 24 hours) at 06/11/2019 1331 Last data filed at 06/11/2019 0600 Gross per 24 hour  Intake 1766.61 ml  Output --  Net 1766.61 ml   Filed Weights   06/09/19 1433 06/09/19 1700 06/11/19 0522  Weight: 64 kg 62.2 kg 86.2 kg    Examination:  General exam: NAD Respiratory system: CTA Cardiovascular system: S 1, S 2 RRR Gastrointestinal system: BS present, soft, nt, mild  distended Central nervous system: non focal.  Extremities: symmetric power.  Skin: no rashes.   Data Reviewed: I have personally reviewed following labs and imaging studies  CBC: Recent Labs  Lab 06/09/19 1234 06/10/19 0036 06/11/19 0756  WBC 13.1* 13.1* 13.6*  NEUTROABS 8.0*  --  2.8  HGB 13.3 10.2* 9.7*  HCT 41.3 31.9* 30.3*  MCV 74.3* 73.8* 73.2*  PLT 271 208 159   Basic Metabolic Panel: Recent  Labs  Lab 06/09/19 1234 06/09/19 1838 06/09/19 2041 06/10/19 0036 06/10/19 0845 06/11/19 0756  NA 126* 135 138 135  --  138  K 4.1 4.2 3.6 3.3*  --  4.7  CL 85* 100 103 103  --  110  CO2 16* 21* 25 23  --  19*  GLUCOSE 603* 299* 195* 152*  --  130*  BUN 34* 28* 26* 19  --  8  CREATININE 1.42* 1.01* 0.79 0.73  --  0.77  CALCIUM 9.0 8.0* 8.1* 7.9*  --  8.0*  MG  --   --   --   --  2.1  --    GFR: Estimated Creatinine Clearance: 69.5 mL/min (by C-G formula based on SCr of 0.77 mg/dL). Liver Function Tests: Recent Labs  Lab 06/09/19 1234 06/10/19 0036 06/11/19 0756  AST 305* 353* 347*  ALT 312* 337* 362*  ALKPHOS 142* 108 99  BILITOT 2.6* 1.5* 1.2  PROT 7.9 6.3* 5.4*  ALBUMIN 4.2 3.3* 2.9*   Recent Labs  Lab 06/09/19 1234  LIPASE 156*   No results for input(s): AMMONIA in the last 168 hours. Coagulation Profile: Recent Labs  Lab 06/10/19 1320  INR 0.9   Cardiac Enzymes: Recent Labs  Lab 06/09/19 1234  TROPONINI <0.03   BNP (last 3 results) No results for input(s): PROBNP in the last 8760 hours. HbA1C: Recent Labs    06/09/19 1230  HGBA1C 12.1*   CBG: Recent Labs  Lab 06/10/19 1123 06/10/19 1737 06/10/19 2202 06/11/19 0713 06/11/19 1126  GLUCAP 180* 164* 102* 121* 97   Lipid Profile: No results for input(s): CHOL, HDL, LDLCALC, TRIG, CHOLHDL, LDLDIRECT in the last 72 hours. Thyroid Function Tests: No results for input(s): TSH, T4TOTAL, FREET4, T3FREE, THYROIDAB in the last 72 hours. Anemia Panel: Recent Labs    06/10/19 1320 06/10/19 1430  VITAMINB12 1,597*  --   FOLATE 10.9  --   FERRITIN 1,046*  --   TIBC 256  --   IRON 116  --   RETICCTPCT  --  1.8   Sepsis Labs: No results for input(s): PROCALCITON, LATICACIDVEN in the last 168 hours.  Recent Results (from the past 240 hour(s))  SARS Coronavirus 2 (CEPHEID - Performed in [place] hospital lab), Hosp Order     Status: None   Collection Time: 06/09/19  2:53 PM   Specimen:  Nasopharyngeal Swab  Result Value Ref Range Status   SARS Coronavirus 2 NEGATIVE NEGATIVE Final    Comment: (NOTE) If result is NEGATIVE SARS-CoV-2 target nucleic acids are NOT DETECTED. The SARS-CoV-2 RNA is generally detectable in upper and lower  respiratory specimens during the acute phase of infection. The lowest  concentration of SARS-CoV-2 viral copies this assay can detect is 250  copies / mL. A negative result does not preclude SARS-CoV-2 infection  and should not be used as the sole basis for treatment or other  patient management decisions.  A negative result may occur with  improper specimen collection / handling, submission of specimen other  than nasopharyngeal swab, presence of viral mutation(s) within the  areas targeted by this assay, and inadequate number of viral copies  (<250 copies / mL). A negative result must be combined with clinical  observations, patient history, and epidemiological information. If result is POSITIVE SARS-CoV-2 target nucleic acids are DETECTED. The SARS-CoV-2 RNA is generally detectable in upper and lower  respiratory specimens dur ing the acute phase of infection.  Positive  results are indicative of active infection with SARS-CoV-2.  Clinical  correlation with patient history and other diagnostic information is  necessary to determine patient infection status.  Positive results do  not rule out bacterial infection or co-infection with other viruses. If result is PRESUMPTIVE POSTIVE SARS-CoV-2 nucleic acids MAY BE PRESENT.   A presumptive positive result was obtained on the submitted specimen  and confirmed on repeat testing.  While 2019 novel coronavirus  (SARS-CoV-2) nucleic acids may be present in the submitted sample  additional confirmatory testing may be necessary for epidemiological  and / or clinical management purposes  to differentiate between  SARS-CoV-2 and other Sarbecovirus currently known to infect humans.  If clinically  indicated additional testing with an alternate test  methodology [phone]) is advised. The SARS-CoV-2 RNA is generally  detectable in upper and lower respiratory sp ecimens during the acute  phase of infection. The expected result is Negative. Fact Sheet for Patients:  StrictlyIdeas.no Fact Sheet for Healthcare Providers: BankingDealers.co.za This test is not yet approved or cleared by the [place] FDA and has been authorized for detection and/or diagnosis of SARS-CoV-2 by FDA under an Emergency Use Authorization (EUA).  This EUA will remain in effect (meaning this test can be used) for the duration of the COVID-19 declaration under Section 564(b)(1) of the Act, 21 U.S.C. section 360bbb-3(b)(1), unless the authorization is terminated or revoked sooner. Performed at [facility], [place] [address] [place], [place] [address]   MRSA PCR Screening     Status: None   Collection Time: 06/09/19  6:40 PM   Specimen: Nasal Mucosa; Nasopharyngeal  Result Value Ref Range Status   MRSA by PCR NEGATIVE NEGATIVE Final    Comment:        The GeneXpert MRSA Assay (FDA approved for NASAL specimens only), is one component of a comprehensive MRSA colonization surveillance program. It is not intended to diagnose MRSA infection nor to guide or monitor treatment for MRSA infections. Performed at [facility], [place] [address] [place],  [address]          Radiology Studies: Ct Abdomen Pelvis Wo Contrast  Result Date: 06/09/2019 CLINICAL DATA:  Abdominal pain and low back pain for 2 months. EXAM: CT ABDOMEN AND PELVIS WITHOUT CONTRAST TECHNIQUE: Multidetector CT imaging of the abdomen and pelvis was performed following the standard protocol without IV contrast. COMPARISON:  None. FINDINGS: Lower chest: Clear lung bases.  Heart normal in size Hepatobiliary: Normal liver. Few small stones in the  gallbladder fundus. No gallbladder wall thickening or inflammation. No bile duct dilation Pancreas: Unremarkable. No pancreatic ductal dilatation or surrounding inflammatory changes. Spleen: Normal in size without focal abnormality. Adrenals/Urinary Tract: Adrenal glands are unremarkable. Kidneys are normal, without renal calculi, focal lesion, or hydronephrosis. Bladder is unremarkable. Stomach/Bowel: Stomach is within normal limits. Appendix appears normal. No evidence of bowel wall thickening, distention, or inflammatory changes. Vascular/Lymphatic: Aortic atherosclerotic calcifications. No aneurysm. No enlarged lymph nodes. Reproductive: Uterus and bilateral adnexa are unremarkable. Other: No abdominal wall hernia or abnormality. No abdominopelvic  ascites. Musculoskeletal: No fracture or acute finding. No osteoblastic or osteolytic lesions. Degenerative changes noted of the visualized spine. IMPRESSION: 1. No acute findings. 2. Degenerative changes of the lumbar spine may account for patient's low back pain 3. Small gallstones.  No acute cholecystitis. 4. Aortic atherosclerosis. Electronically Signed   By: Lajean Manes M.D.   On: 06/09/2019 15:03   Dg Chest 2 View  Result Date: 06/09/2019 CLINICAL DATA:  Nausea, vomiting and abdominal pain. EXAM: CHEST - 2 VIEW COMPARISON:  None. FINDINGS: Cardiac silhouette is normal in size. Normal mediastinal and hilar contours. Clear lungs.  No pleural effusion or pneumothorax. Skeletal structures are intact. IMPRESSION: No active cardiopulmonary disease. Electronically Signed   By: Lajean Manes M.D.   On: 06/09/2019 15:04   US Abdomen Limited Ruq  Result Date: 06/09/2019 CLINICAL DATA:  Elevated LFT EXAM: ULTRASOUND ABDOMEN LIMITED RIGHT UPPER QUADRANT COMPARISON:  CT abdomen pelvis 06/09/2019 FINDINGS: Gallbladder: Small gallstones. Gallbladder sludge. Probable 4 mm gallbladder polyp. Gallbladder wall non thickened. Negative sonographic Murphy sign. Common bile  duct: Diameter: 2.9 mm Liver: No focal lesion identified. Within normal limits in parenchymal echogenicity. Portal vein is patent on color Doppler imaging with normal direction of blood flow towards the liver. IMPRESSION: Cholelithiasis and gallbladder sludge.  Negative for cholecystitis. Electronically Signed   By: Franchot Gallo M.D.   On: 06/09/2019 18:32        Scheduled Meds:  amLODipine  5 mg Oral Daily   Chlorhexidine Gluconate Cloth  6 each Topical Daily   dicyclomine  10 mg Oral TID AC & HS   enoxaparin (LOVENOX) injection  40 mg Subcutaneous Q24H   insulin aspart  0-15 Units Subcutaneous TID AC & HS   insulin glargine  10 Units Subcutaneous Daily   insulin starter kit- pen needles  1 kit Other Once   living well with diabetes book   Does not apply Once   pantoprazole (PROTONIX) IV  40 mg Intravenous Q12H   polyethylene glycol  17 g Oral Daily   Continuous Infusions:  sodium chloride 100 mL/hr at 06/11/19 0633   sodium chloride 20 mL/hr at 06/11/19 0600     LOS: 2 days    Time spent: 35 minutes.     Elmarie Shiley, MD [facility] Pager [phone]  If 7PM-7AM, please contact night-coverage [email or web address] Password [email or web address] 06/11/2019, 1:31 PM

## 2019-06-12 LAB — BASIC METABOLIC PANEL
Anion gap: 9 (ref 5–15)
BUN: <5 mg/dL — ABNORMAL LOW (ref 8–23)
CO2: 21 mmol/L — ABNORMAL LOW (ref 22–32)
Calcium: 8 mg/dL — ABNORMAL LOW (ref 8.9–10.3)
Chloride: 111 mmol/L (ref 98–111)
Creatinine, Ser: 0.64 mg/dL (ref 0.44–1.00)
GFR calc Af Amer: >60 mL/min (ref >60)
GFR calc non Af Amer: >60 mL/min (ref >60)
Glucose, Bld: 122 mg/dL — ABNORMAL HIGH (ref 70–99)
Potassium: 3.4 mmol/L — ABNORMAL LOW (ref 3.5–5.1)
Sodium: 141 mmol/L (ref 135–145)

## 2019-06-12 LAB — HEPATIC FUNCTION PANEL
ALT: 261 U/L — ABNORMAL HIGH (ref 0–44)
AST: 166 U/L — ABNORMAL HIGH (ref 15–41)
Albumin: 2.9 g/dL — ABNORMAL LOW (ref 3.5–5.0)
Alkaline Phosphatase: 98 U/L (ref 38–126)
Bilirubin, Direct: 0.3 mg/dL — ABNORMAL HIGH (ref 0.0–0.2)
Indirect Bilirubin: 0.3 mg/dL (ref 0.3–0.9)
Total Bilirubin: 0.6 mg/dL (ref 0.3–1.2)
Total Protein: 5.4 g/dL — ABNORMAL LOW (ref 6.5–8.1)

## 2019-06-12 LAB — GLUCOSE, CAPILLARY
Glucose-Capillary: 110 mg/dL — ABNORMAL HIGH (ref 70–99)
Glucose-Capillary: 157 mg/dL — ABNORMAL HIGH (ref 70–99)
Glucose-Capillary: 127 mg/dL — ABNORMAL HIGH (ref 70–99)
Glucose-Capillary: 98 mg/dL (ref 70–99)

## 2019-06-12 LAB — CBC
HCT: 31.3 % — ABNORMAL LOW (ref 36.0–46.0)
Hemoglobin: 9.7 g/dL — ABNORMAL LOW (ref 12.0–15.0)
MCH: 24 pg — ABNORMAL LOW (ref 26.0–34.0)
MCHC: 31 g/dL (ref 30.0–36.0)
MCV: 77.5 fL — ABNORMAL LOW (ref 80.0–100.0)
Platelets: 217 10*3/uL (ref 150–400)
RBC: 4.04 MIL/uL (ref 3.87–5.11)
RDW: 15.6 % — ABNORMAL HIGH (ref 11.5–15.5)
WBC: 17.9 10*3/uL — ABNORMAL HIGH (ref 4.0–10.5)
nRBC: 0 % (ref 0.0–0.2)

## 2019-06-12 LAB — HEPATITIS C VRS RNA DETECT BY PCR-QUAL: Hepatitis C Vrs RNA by PCR-Qual: POSITIVE — AB

## 2019-06-12 MED ORDER — INSULIN ASPART 100 UNIT/ML ~~LOC~~ SOLN
0.0000 [IU] | Freq: Three times a day (TID) | SUBCUTANEOUS | Status: DC
  Administered 2019-06-12: 2 [IU] via SUBCUTANEOUS
  Administered 2019-06-12 – 2019-06-13 (×2): 1 [IU] via SUBCUTANEOUS
  Administered 2019-06-14 (×2): 2 [IU] via SUBCUTANEOUS

## 2019-06-12 MED ORDER — POTASSIUM CHLORIDE 20 MEQ PO PACK
40.0000 meq | PACK | Freq: Two times a day (BID) | ORAL | Status: AC
  Administered 2019-06-12 (×2): 40 meq via ORAL
  Filled 2019-06-12 (×2): qty 2

## 2019-06-12 MED ORDER — INSULIN GLARGINE 100 UNIT/ML ~~LOC~~ SOLN
8.0000 [IU] | Freq: Every day | SUBCUTANEOUS | Status: DC
  Administered 2019-06-13 – 2019-06-14 (×2): 8 [IU] via SUBCUTANEOUS
  Filled 2019-06-12 (×2): qty 0.08

## 2019-06-12 MED ORDER — SODIUM CHLORIDE 0.9 % IV BOLUS
1000.0000 mL | Freq: Once | INTRAVENOUS | Status: AC
  Administered 2019-06-12: 1000 mL via INTRAVENOUS

## 2019-06-12 NOTE — Progress Notes (Addendum)
[facility] Progress Note  CC:  Elevated LFTs, dysphagia   Subjective: No difficulty swallowing pills or clear liquids. She is hungry, wishes to advance her diet. No NV. No abdominal pain. She had a few loose brown stools last night, no further BMs today. No melena or rectal bleeding.    Objective:  Vital signs in last 24 hours: Temp:  [97.7 F (36.5 C)-98.4 F (36.9 C)] 98.4 F (36.9 C) (06/21 0615) Pulse Rate:  [77-89] 81 (06/21 0615) Resp:  [16-18] 18 (06/21 0615) BP: (118-131)/(68-75) 131/75 (06/21 0615) SpO2:  [97 %-100 %] 99 % (06/21 0615) Last BM Date: 06/11/19 General:   Alert,  Well-developed,    in NAD Heart: RRR, no murmur. Pulm: Lungs clear throughout. Abdomen: Soft, nontender, + BS, midline lower abdominal scar intact. Extremities:  Without edema. Neurologic:  Alert and  oriented x4;  grossly normal neurologically. Psych:  Alert and cooperative. Normal mood and affect.  Intake/Output from previous day: 06/20 0701 - 06/21 0700 In: 3180.8 [P.O.:800; I.V.:2380.8] Out: 0  Intake/Output this shift: No intake/output data recorded.  Lab Results: Recent Labs    06/10/19 0036 06/11/19 0756 06/12/19 0742  WBC 13.1* 13.6* 17.9*  HGB 10.2* 9.7* 9.7*  HCT 31.9* 30.3* 31.3*  PLT 208 200 217   BMET Recent Labs    06/10/19 0036 06/11/19 0756 06/12/19 0742  NA 135 138 141  K 3.3* 4.7 3.4*  CL 103 110 111  CO2 23 19* 21*  GLUCOSE 152* 130* 122*  BUN 19 8 <5*  CREATININE 0.73 0.77 0.64  CALCIUM 7.9* 8.0* 8.0*   LFT Recent Labs    06/11/19 0756  PROT 5.4*  ALBUMIN 2.9*  AST 347*  ALT 362*  ALKPHOS 99  BILITOT 1.2   PT/INR Recent Labs    06/10/19 1320  LABPROT 12.5  INR 0.9   Hepatitis Panel Recent Labs    06/09/19 1838  HEPBSAG Negative  HCVAB >11.0*  HEPAIGM Negative  HEPBIGM Negative     Assessment / Plan:  1. 63 y.o. female with epigastric pain,  elevated LFTs and hyperbilirubinemia. Hepatitis C Ab +. Hep C RNA  pending. CT AP 6/18  Showed small gallstones. Abdominal sono showed small gallstones, gallbladder sludge, probable 54m gallbladder polyp. CBD diameter 2.998m Portal vein is patent. 6/20 Alk Phos 99. AST 347. ALT 362. T. Bili 1.2. -add hepatic panel to today's lab draw -await Hep C RNA result  2. Microcytic Anemia: Hg 9.7. HCT 30.3 down from 10.2. Iron 116. No signs of active GI bleeding. She passed a normal brown BM this am. -monitor H/H -continue PPI -EGD scheduled tomorrow  3. Leukocytosis ? etiology: WBC  17.9 >> 13.6. She is a febrile.  -Monitor CBC and temperature  4. Dysphagia. Nausea. Vomiting.  -EGD with Dr. GuLyndel Safeomorrow -NPO after midnight  -full liquid diet  5. Lower abdominal pain, constipation. No further lower abdominal pain.  -stop Miralax today as patient having loose stools -if loose stools persist then check GI panel and C. Diff PCR  Further recommendations per Dr. StFuller Plan  LOS: 3 days   CoNoralyn PickP 06/12/2019, 12:28 PM      Attending Physician Note   I have taken an interval history, reviewed the chart and examined the patient. I agree with the Advanced Practitioner's note, impression and recommendations.   EGD tomorrow with Dr. GuLyndel Safeo evaluate dysphagia, nausea, vomiting.  Elevated LFTs. Recheck LFTs. Await HCV RNA, ANA.  Lower  abdominal pain, constipation have resolved. Continue dicyclomine and Miralax qd prn.  Dr. Lyndel Safe is covering [facility] hospital service starting on Monday.   Lucio Edward, MD FACG [phone]

## 2019-06-12 NOTE — Progress Notes (Signed)
PROGRESS NOTE    Maria Robinson  EAV:[id number] DOB: 1956-01-03 DOA: 06/09/2019 PCP: Maria Robinson    Brief Narrative; 63 year old with past medical history significant for hypertension who present with multiple complaints.  Patient reports feeling weak, poor appetite.  Report of nausea and vomiting.  She has also been complaining of abdominal pain radiating to her back.  She also report increase thirsty and dry mouth.  She denies bowel movement since 3 days prior to admission.  Evaluation in the ED patient was found to have acute kidney injury, hyponatremia, hyperglycemia with DKA, elevated  liver enzymes.  Assessment & Plan:   Principal Problem:   DKA (diabetic ketoacidoses) ([place]) Active Problems:   DM (diabetes mellitus) ([place])   AKI (acute kidney injury) ([place])   Elevated liver enzymes   Hyponatremia   Muscle weakness (generalized)   Elevated LFTs   Lower abdominal pain   1-DKA: New diagnosis for diabetes. Hemoglobin A1c:12 Patient was initially treated with IV fluids, insulin drip.  Her gap is close blood sugar has decreased.  She was transitioned to Lantus 10 units.  Continue with a sliding scale insulin. Diabetic  educator will discuss diet with patient Continue with fluids.   2-Transaminases; Abdominal pain, nausea vomiting.  Nausea vomiting could have be related to DKA.  But patient presents with mildly increased lipase and transaminases.  Gallbladder with cholelithiasis. Hepatitis C antibody positive.   quantitative hepatitis C level RNA pending.  GI has been consulted. [facility]  For abdominal pain started on Bentyl and miralax.   3-Dysphagia to liquids: GI consulted.  Started  PPI. Plan for endoscopy tomorrow.   4-hypertension: Continue with Norvasc.  5-Leukocytosis: Chest x-ray negative.  UA negative for infection.  Right upper quadrant ultrasound negative for cholecystitis.   Hold on antibiotics remain afebrile.  Denies cough, dysuria.  Had loose stool  last night, check GI pathogen.  CT abdomen on admission; gallstone, degenerative changes lumbar spine. No evidence of infection.  Repeat labs in am.   6-hypokalemia: Replete orally.     Estimated body mass index is 38.38 kg/m as calculated from the following:   Height as of this encounter: '4\' 11"'  (1.499 m).   Weight as of this encounter: 86.2 kg.   DVT prophylaxis: SCDs Code Status: Full code Family Communication: Care discussed with patient.  I offered to update a family member, patient relate that she will update her family. Disposition Plan: Remain in the hospital for IV fluids, further evaluation of dysphagia and transaminases. Plan to start diet, she will need endoscopy prior to discharge  Consultants:   GI   Procedures:   Right upper quadrant ultrasound:Cholelithiasis and gallbladder sludge.  Negative for cholecystitis     Antimicrobials:   None   Subjective: Denies abdominal pain. Denies cough. Wants to eat   Objective: Vitals:   06/11/19 0522 06/11/19 1504 06/11/19 2101 06/12/19 0615  BP: 140/79 118/68 119/69 131/75  Pulse: 91 89 77 81  Resp: '18 16 16 18  ' Temp: 98.1 F (36.7 C) 98 F (36.7 C) 97.7 F (36.5 C) 98.4 F (36.9 C)  TempSrc: Oral Oral Oral Oral  SpO2: 100% 100% 97% 99%  Weight: 86.2 kg     Height:        Intake/Output Summary (Last 24 hours) at 06/12/2019 1121 Last data filed at 06/12/2019 0634 [facility] 24 hour  Intake 2843.24 ml  Output 0 ml  Net 2843.24 ml   Filed Weights   06/09/19 1433 06/09/19 1700  06/11/19 0522  Weight: 64 kg 62.2 kg 86.2 kg    Examination:  General exam: NAD Respiratory system: CTA Cardiovascular system: S 1, S 2 RRR Gastrointestinal system: BS present, soft, nt Central nervous system: Non focal.  Extremities: Symmetric power.  Skin: no rashes.   Data Reviewed: I have personally reviewed following labs and imaging studies  CBC: Recent Labs  Lab 06/09/19 1234 06/10/19 0036 06/11/19 0756  06/12/19 0742  WBC 13.1* 13.1* 13.6* 17.9*  NEUTROABS 8.0*  --  2.8  --   HGB 13.3 10.2* 9.7* 9.7*  HCT 41.3 31.9* 30.3* 31.3*  MCV 74.3* 73.8* 73.2* 77.5*  PLT 271 208 200 263   Basic Metabolic Panel: Recent Labs  Lab 06/09/19 1838 06/09/19 2041 06/10/19 0036 06/10/19 0845 06/11/19 0756 06/12/19 0742  NA 135 138 135  --  138 141  K 4.2 3.6 3.3*  --  4.7 3.4*  CL 100 103 103  --  110 111  CO2 21* 25 23  --  19* 21*  GLUCOSE 299* 195* 152*  --  130* 122*  BUN 28* 26* 19  --  8 <5*  CREATININE 1.01* 0.79 0.73  --  0.77 0.64  CALCIUM 8.0* 8.1* 7.9*  --  8.0* 8.0*  MG  --   --   --  2.1  --   --    GFR: Estimated Creatinine Clearance: 69.5 mL/min (by C-G formula based on SCr of 0.64 mg/dL). Liver Function Tests: Recent Labs  Lab 06/09/19 1234 06/10/19 0036 06/11/19 0756  AST 305* 353* 347*  ALT 312* 337* 362*  ALKPHOS 142* 108 99  BILITOT 2.6* 1.5* 1.2  PROT 7.9 6.3* 5.4*  ALBUMIN 4.2 3.3* 2.9*   Recent Labs  Lab 06/09/19 1234  LIPASE 156*   No results for input(s): AMMONIA in the last 168 hours. Coagulation Profile: Recent Labs  Lab 06/10/19 1320  INR 0.9   Cardiac Enzymes: Recent Labs  Lab 06/09/19 1234  TROPONINI <0.03   BNP (last 3 results) No results for input(s): PROBNP in the last 8760 hours. HbA1C: Recent Labs    06/09/19 1230  HGBA1C 12.1*   CBG: Recent Labs  Lab 06/11/19 1126 06/11/19 1612 06/11/19 2102 06/11/19 2140 06/12/19 0720  GLUCAP 97 289* 56* 101* 110*   Lipid Profile: No results for input(s): CHOL, HDL, LDLCALC, TRIG, CHOLHDL, LDLDIRECT in the last 72 hours. Thyroid Function Tests: No results for input(s): TSH, T4TOTAL, FREET4, T3FREE, THYROIDAB in the last 72 hours. Anemia Panel: Recent Labs    06/10/19 1320 06/10/19 1430  VITAMINB12 1,597*  --   FOLATE 10.9  --   FERRITIN 1,046*  --   TIBC 256  --   IRON 116  --   RETICCTPCT  --  1.8   Sepsis Labs: No results for input(s): PROCALCITON, LATICACIDVEN in the  last 168 hours.  Recent Results (from the past 240 hour(s))  SARS Coronavirus 2 (CEPHEID - Performed in [place] hospital lab), Hosp Order     Status: None   Collection Time: 06/09/19  2:53 PM   Specimen: Nasopharyngeal Swab  Result Value Ref Range Status   SARS Coronavirus 2 NEGATIVE NEGATIVE Final    Comment: (NOTE) If result is NEGATIVE SARS-CoV-2 target nucleic acids are NOT DETECTED. The SARS-CoV-2 RNA is generally detectable in upper and lower  respiratory specimens during the acute phase of infection. The lowest  concentration of SARS-CoV-2 viral copies this assay can detect is 250  copies / mL. A  negative result does not preclude SARS-CoV-2 infection  and should not be used as the sole basis for treatment or other  patient management decisions.  A negative result may occur with  improper specimen collection / handling, submission of specimen other  than nasopharyngeal swab, presence of viral mutation(s) within the  areas targeted by this assay, and inadequate number of viral copies  (<250 copies / mL). A negative result must be combined with clinical  observations, patient history, and epidemiological information. If result is POSITIVE SARS-CoV-2 target nucleic acids are DETECTED. The SARS-CoV-2 RNA is generally detectable in upper and lower  respiratory specimens dur ing the acute phase of infection.  Positive  results are indicative of active infection with SARS-CoV-2.  Clinical  correlation with patient history and other diagnostic information is  necessary to determine patient infection status.  Positive results do  not rule out bacterial infection or co-infection with other viruses. If result is PRESUMPTIVE POSTIVE SARS-CoV-2 nucleic acids MAY BE PRESENT.   A presumptive positive result was obtained on the submitted specimen  and confirmed on repeat testing.  While 2019 novel coronavirus  (SARS-CoV-2) nucleic acids may be present in the submitted sample  additional  confirmatory testing may be necessary for epidemiological  and / or clinical management purposes  to differentiate between  SARS-CoV-2 and other Sarbecovirus currently known to infect humans.  If clinically indicated additional testing with an alternate test  methodology [phone]) is advised. The SARS-CoV-2 RNA is generally  detectable in upper and lower respiratory sp ecimens during the acute  phase of infection. The expected result is Negative. Fact Sheet for Patients:  StrictlyIdeas.no Fact Sheet for Healthcare Providers: BankingDealers.co.za This test is not yet approved or cleared by the [place] FDA and has been authorized for detection and/or diagnosis of SARS-CoV-2 by FDA under an Emergency Use Authorization (EUA).  This EUA will remain in effect (meaning this test can be used) for the duration of the COVID-19 declaration under Section 564(b)(1) of the Act, 21 U.S.C. section 360bbb-3(b)(1), unless the authorization is terminated or revoked sooner. Performed at [facility], [place] [address], [place], [place] [address]   MRSA PCR Screening     Status: None   Collection Time: 06/09/19  6:40 PM   Specimen: Nasal Mucosa; Nasopharyngeal  Result Value Ref Range Status   MRSA by PCR NEGATIVE NEGATIVE Final    Comment:        The GeneXpert MRSA Assay (FDA approved for NASAL specimens only), is one component of a comprehensive MRSA colonization surveillance program. It is not intended to diagnose MRSA infection nor to guide or monitor treatment for MRSA infections. Performed at [facility], [place] [address], [place], [place] [address]          Radiology Studies: No results found.      Scheduled Meds: . amLODipine  5 mg Oral Daily  . Chlorhexidine Gluconate Cloth  6 each Topical Daily  . dicyclomine  10 mg Oral TID AC & HS  . enoxaparin (LOVENOX) injection  40 mg Subcutaneous  Q24H  . insulin aspart  0-9 Units Subcutaneous TID WC  . [START ON 06/13/2019] insulin glargine  8 Units Subcutaneous Daily  . insulin starter kit- pen needles  1 kit Other Once  . living well with diabetes book   Does not apply Once  . pantoprazole (PROTONIX) IV  40 mg Intravenous Q12H  . polyethylene glycol  17 g Oral Daily  . potassium chloride  40 mEq Oral BID   Continuous Infusions: . sodium chloride 100 mL/hr at 06/12/19 0634  . sodium chloride 20 mL/hr at 06/11/19 0600  . sodium chloride       LOS: 3 days    Time spent: 35 minutes.     Elmarie Shiley, MD [facility] Pager [phone]  If 7PM-7AM, please contact night-coverage [email or web address] Password [facility] 06/12/2019, 11:21 AM

## 2019-06-12 NOTE — Plan of Care (Signed)

## 2019-06-12 NOTE — H&P (View-Only) (Signed)
[facility] Progress Note  CC:  Elevated LFTs, dysphagia   Subjective: No difficulty swallowing pills or clear liquids. She is hungry, wishes to advance her diet. No NV. No abdominal pain. She had a few loose brown stools last night, no further BMs today. No melena or rectal bleeding.    Objective:  Vital signs in last 24 hours: Temp:  [97.7 F (36.5 C)-98.4 F (36.9 C)] 98.4 F (36.9 C) (06/21 0615) Pulse Rate:  [77-89] 81 (06/21 0615) Resp:  [16-18] 18 (06/21 0615) BP: (118-131)/(68-75) 131/75 (06/21 0615) SpO2:  [97 %-100 %] 99 % (06/21 0615) Last BM Date: 06/11/19 General:   Alert,  Well-developed,    in NAD Heart: RRR, no murmur. Pulm: Lungs clear throughout. Abdomen: Soft, nontender, + BS, midline lower abdominal scar intact. Extremities:  Without edema. Neurologic:  Alert and  oriented x4;  grossly normal neurologically. Psych:  Alert and cooperative. Normal mood and affect.  Intake/Output from previous day: 06/20 0701 - 06/21 0700 In: 3180.8 [P.O.:800; I.V.:2380.8] Out: 0  Intake/Output this shift: No intake/output data recorded.  Lab Results: Recent Labs    06/10/19 0036 06/11/19 0756 06/12/19 0742  WBC 13.1* 13.6* 17.9*  HGB 10.2* 9.7* 9.7*  HCT 31.9* 30.3* 31.3*  PLT 208 200 217   BMET Recent Labs    06/10/19 0036 06/11/19 0756 06/12/19 0742  NA 135 138 141  K 3.3* 4.7 3.4*  CL 103 110 111  CO2 23 19* 21*  GLUCOSE 152* 130* 122*  BUN 19 8 <5*  CREATININE 0.73 0.77 0.64  CALCIUM 7.9* 8.0* 8.0*   LFT Recent Labs    06/11/19 0756  PROT 5.4*  ALBUMIN 2.9*  AST 347*  ALT 362*  ALKPHOS 99  BILITOT 1.2   PT/INR Recent Labs    06/10/19 1320  LABPROT 12.5  INR 0.9   Hepatitis Panel Recent Labs    06/09/19 1838  HEPBSAG Negative  HCVAB >11.0*  HEPAIGM Negative  HEPBIGM Negative     Assessment / Plan:  1. 63 y.o. female with epigastric pain,  elevated LFTs and hyperbilirubinemia. Hepatitis C Ab +. Hep C RNA  pending. CT AP 6/18  Showed small gallstones. Abdominal sono showed small gallstones, gallbladder sludge, probable 14m gallbladder polyp. CBD diameter 2.972m Portal vein is patent. 6/20 Alk Phos 99. AST 347. ALT 362. T. Bili 1.2. -add hepatic panel to today's lab draw -await Hep C RNA result  2. Microcytic Anemia: Hg 9.7. HCT 30.3 down from 10.2. Iron 116. No signs of active GI bleeding. She passed a normal brown BM this am. -monitor H/H -continue PPI -EGD scheduled tomorrow  3. Leukocytosis ? etiology: WBC  17.9 >> 13.6. She is a febrile.  -Monitor CBC and temperature  4. Dysphagia. Nausea. Vomiting.  -EGD with Dr. GuLyndel Safeomorrow -NPO after midnight  -full liquid diet  5. Lower abdominal pain, constipation. No further lower abdominal pain.  -stop Miralax today as patient having loose stools -if loose stools persist then check GI panel and C. Diff PCR  Further recommendations per Dr. StFuller Plan  LOS: 3 days   CoNoralyn PickP 06/12/2019, 12:28 PM      Attending Physician Note   I have taken an interval history, reviewed the chart and examined the patient. I agree with the Advanced Practitioner's note, impression and recommendations.   EGD tomorrow with Dr. GuLyndel Safeo evaluate dysphagia, nausea, vomiting.  Elevated LFTs. Recheck LFTs. Await HCV RNA, ANA.  Lower  abdominal pain, constipation have resolved. Continue dicyclomine and Miralax qd prn.  Dr. Lyndel Safe is covering [facility] hospital service starting on Monday.   Lucio Edward, MD FACG [phone]

## 2019-06-13 ENCOUNTER — Encounter (HOSPITAL_COMMUNITY)
Admission: EM | Disposition: A | Discharge status: Home / Self Care | Payer: Self-pay | Source: Home / Self Care | Attending: Internal Medicine

## 2019-06-13 ENCOUNTER — Encounter (HOSPITAL_COMMUNITY): Payer: Self-pay | Admitting: *Deleted

## 2019-06-13 ENCOUNTER — Inpatient Hospital Stay (HOSPITAL_COMMUNITY): Payer: Self-pay | Admitting: Registered Nurse

## 2019-06-13 DIAGNOSIS — R1310 Dysphagia, unspecified: Secondary | ICD-10-CM

## 2019-06-13 DIAGNOSIS — K3189 Other diseases of stomach and duodenum: Secondary | ICD-10-CM

## 2019-06-13 DIAGNOSIS — R1319 Other dysphagia: Secondary | ICD-10-CM

## 2019-06-13 DIAGNOSIS — K449 Diaphragmatic hernia without obstruction or gangrene: Secondary | ICD-10-CM

## 2019-06-13 HISTORY — PX: MALONEY DILATION: SHX5535

## 2019-06-13 HISTORY — PX: ESOPHAGOGASTRODUODENOSCOPY (EGD) WITH PROPOFOL: SHX5813

## 2019-06-13 HISTORY — PX: BIOPSY: SHX5522

## 2019-06-13 LAB — BASIC METABOLIC PANEL
Anion gap: 8 (ref 5–15)
BUN: <5 mg/dL — ABNORMAL LOW (ref 8–23)
CO2: 21 mmol/L — ABNORMAL LOW (ref 22–32)
Calcium: 7.8 mg/dL — ABNORMAL LOW (ref 8.9–10.3)
Chloride: 114 mmol/L — ABNORMAL HIGH (ref 98–111)
Creatinine, Ser: 0.66 mg/dL (ref 0.44–1.00)
GFR calc Af Amer: >60 mL/min (ref >60)
GFR calc non Af Amer: >60 mL/min (ref >60)
Glucose, Bld: 111 mg/dL — ABNORMAL HIGH (ref 70–99)
Potassium: 3.9 mmol/L (ref 3.5–5.1)
Sodium: 143 mmol/L (ref 135–145)

## 2019-06-13 LAB — CBC WITH DIFFERENTIAL/PLATELET
Abs Immature Granulocytes: 0.04 10*3/uL (ref 0.00–0.07)
Basophils Absolute: 0.1 10*3/uL (ref 0.0–0.1)
Basophils Relative: 1 %
Eosinophils Absolute: 0.2 10*3/uL (ref 0.0–0.5)
Eosinophils Relative: 1 %
HCT: 30.5 % — ABNORMAL LOW (ref 36.0–46.0)
Hemoglobin: 9.6 g/dL — ABNORMAL LOW (ref 12.0–15.0)
Immature Granulocytes: 0 %
Lymphocytes Relative: 83 %
Lymphs Abs: 15.7 10*3/uL — ABNORMAL HIGH (ref 0.7–4.0)
MCH: 24.1 pg — ABNORMAL LOW (ref 26.0–34.0)
MCHC: 31.5 g/dL (ref 30.0–36.0)
MCV: 76.4 fL — ABNORMAL LOW (ref 80.0–100.0)
Monocytes Absolute: 0.9 10*3/uL (ref 0.1–1.0)
Monocytes Relative: 5 %
Neutro Abs: 1.8 10*3/uL (ref 1.7–7.7)
Neutrophils Relative %: 10 %
Platelets: 248 10*3/uL (ref 150–400)
RBC: 3.99 MIL/uL (ref 3.87–5.11)
RDW: 15.5 % (ref 11.5–15.5)
WBC: 18.8 10*3/uL — ABNORMAL HIGH (ref 4.0–10.5)
nRBC: 0.1 % (ref 0.0–0.2)

## 2019-06-13 LAB — HEPATIC FUNCTION PANEL
ALT: 193 U/L — ABNORMAL HIGH (ref 0–44)
AST: 85 U/L — ABNORMAL HIGH (ref 15–41)
Albumin: 2.8 g/dL — ABNORMAL LOW (ref 3.5–5.0)
Alkaline Phosphatase: 88 U/L (ref 38–126)
Bilirubin, Direct: 0.3 mg/dL — ABNORMAL HIGH (ref 0.0–0.2)
Indirect Bilirubin: 0.2 mg/dL — ABNORMAL LOW (ref 0.3–0.9)
Total Bilirubin: 0.5 mg/dL (ref 0.3–1.2)
Total Protein: 5.1 g/dL — ABNORMAL LOW (ref 6.5–8.1)

## 2019-06-13 LAB — GASTROINTESTINAL PANEL BY PCR, STOOL (REPLACES STOOL CULTURE)

## 2019-06-13 LAB — LACTATE DEHYDROGENASE: LDH: 197 U/L — ABNORMAL HIGH (ref 98–192)

## 2019-06-13 LAB — ANTINUCLEAR ANTIBODIES, IFA: ANA Ab, IFA: NEGATIVE

## 2019-06-13 LAB — GLUCOSE, CAPILLARY
Glucose-Capillary: 116 mg/dL — ABNORMAL HIGH (ref 70–99)
Glucose-Capillary: 107 mg/dL — ABNORMAL HIGH (ref 70–99)
Glucose-Capillary: 145 mg/dL — ABNORMAL HIGH (ref 70–99)
Glucose-Capillary: 131 mg/dL — ABNORMAL HIGH (ref 70–99)

## 2019-06-13 LAB — PATHOLOGIST SMEAR REVIEW

## 2019-06-13 SURGERY — ESOPHAGOGASTRODUODENOSCOPY (EGD) WITH PROPOFOL
Anesthesia: Monitor Anesthesia Care

## 2019-06-13 MED ORDER — SODIUM CHLORIDE 0.9 % IV SOLN: INTRAVENOUS | Status: DC

## 2019-06-13 MED ORDER — PROPOFOL 10 MG/ML IV BOLUS
INTRAVENOUS | Status: AC
  Filled 2019-06-13: qty 20

## 2019-06-13 MED ORDER — LACTATED RINGERS IV SOLN
INTRAVENOUS | Status: AC | PRN
  Administered 2019-06-13: 1000 mL via INTRAVENOUS

## 2019-06-13 MED ORDER — PROPOFOL 500 MG/50ML IV EMUL
INTRAVENOUS | Status: DC | PRN
  Administered 2019-06-13: 140 ug/kg/min via INTRAVENOUS

## 2019-06-13 MED ORDER — PROPOFOL 10 MG/ML IV BOLUS
INTRAVENOUS | Status: DC | PRN
  Administered 2019-06-13 (×2): 20 mg via INTRAVENOUS

## 2019-06-13 MED ORDER — PROPOFOL 10 MG/ML IV BOLUS
INTRAVENOUS | Status: AC
  Filled 2019-06-13: qty 40

## 2019-06-13 SURGICAL SUPPLY — 15 items

## 2019-06-13 NOTE — Interval H&P Note (Signed)
History and Physical Interval Note:  06/13/2019 11:54 AM  Maria Robinson  has presented today for surgery, with the diagnosis of Anemia, dysphagia.  The various methods of treatment have been discussed with the patient and family. After consideration of risks, benefits and other options for treatment, the patient has consented to  Procedure(s): ESOPHAGOGASTRODUODENOSCOPY (EGD) WITH PROPOFOL (N/A) as a surgical intervention.  The patient's history has been reviewed, patient examined, no change in status, stable for surgery.  I have reviewed the patient's chart and labs.  Questions were answered to the patient's satisfaction.     Maria Robinson

## 2019-06-13 NOTE — Anesthesia Preprocedure Evaluation (Addendum)
Anesthesia Evaluation  Patient identified by MRN, date of birth, ID band Patient awake    Reviewed: Allergy & Precautions, H&P , NPO status , Patient's Chart, lab work & pertinent test results, reviewed documented beta blocker date and time   Airway Mallampati: II  TM Distance: >3 FB Neck ROM: full    Dental no notable dental hx. (+) Teeth Intact, Partial Lower   Pulmonary neg pulmonary ROS,    Pulmonary exam normal breath sounds clear to auscultation       Cardiovascular Exercise Tolerance: Good hypertension, Pt. on medications negative cardio ROS   Rhythm:regular Rate:Normal     Neuro/Psych negative neurological ROS  negative psych ROS   GI/Hepatic negative GI ROS, (+) Hepatitis -, UnspecifiedElevated LFTs with recent hepatocellular insult at [facility] without a clear etiology. Transaminases, t bil have bumped again. HCV Ab now positive.   Endo/Other  diabetes, Poorly Controlled  Renal/GU ARFRenal disease  negative genitourinary   Musculoskeletal   Abdominal   Peds  Hematology negative hematology ROS (+)   Anesthesia Other Findings ED Course: Found to have acute kidney injury, hyponatremia, hyperglycemia with DKA, elevated liver enzymes.  Reproductive/Obstetrics negative OB ROS                          Anesthesia Physical Anesthesia Plan  ASA: III  Anesthesia Plan: MAC   Post-op Pain Management:    Induction: Intravenous  PONV Risk Score and Plan: 3  Airway Management Planned: Mask, Natural Airway and Nasal Cannula  Additional Equipment:   Intra-op Plan:   Post-operative Plan:   Informed Consent: I have reviewed the patients History and Physical, chart, labs and discussed the procedure including the risks, benefits and alternatives for the proposed anesthesia with the patient or authorized representative who has indicated his/her understanding and acceptance.     Dental  Advisory Given  Plan Discussed with: CRNA, Anesthesiologist and Surgeon  Anesthesia Plan Comments:        Anesthesia Quick Evaluation

## 2019-06-13 NOTE — Progress Notes (Signed)
PROGRESS NOTE    Maria Robinson  WUJ:[id number] DOB: August 26, 1956 DOA: 06/09/2019 PCP: Patient, No Pcp Per    Brief Narrative; 63 year old with past medical history significant for hypertension who present with multiple complaints.  Patient reports feeling weak, poor appetite.  Report of nausea and vomiting.  She has also been complaining of abdominal pain radiating to her back.  She also report increase thirsty and dry mouth.  She denies bowel movement since 3 days prior to admission.  Evaluation in the ED patient was found to have acute kidney injury, hyponatremia, hyperglycemia with DKA, elevated  liver enzymes.  Assessment & Plan:   Principal Problem:   DKA (diabetic ketoacidoses) ([place]) Active Problems:   DM (diabetes mellitus) ([place])   AKI (acute kidney injury) ([place])   Elevated liver enzymes   Hyponatremia   Muscle weakness (generalized)   Elevated LFTs   Lower abdominal pain   Esophageal dysphagia   1-DKA: New diagnosis for diabetes. Hemoglobin A1c:12 Patient was initially treated with IV fluids, insulin drip.  Her gap is close blood sugar has decreased.  She was transitioned to Lantus 10 units.  Continue with a sliding scale insulin. Diabetic  educator will discuss diet with patient Continue with fluids.   2-Transaminases; Abdominal pain, nausea vomiting.  Nausea vomiting could have be related to DKA.  But patient presents with mildly increased lipase and transaminases.  Gallbladder with cholelithiasis. Hepatitis C antibody positive.   quantitative hepatitis C level RNA pending.  GI has been consulted. Per GI  For abdominal pain started on Bentyl and miralax.  S/P endoscopy; showed presbyesophagus status post esophageal dilation, is more transient he had double hernia, erosive gastritis with a small inflammatory polyp in the antrum. -Per GI if patient develops fever abdominal pain and vomiting she will require evaluation for cholecystectomy. -Liver function tests  trending down  3-Dysphagia to liquids: GI consulted.  Started  PPI. Plan for endoscopy; S/P endoscopy; showed presbyesophagus status post esophageal dilation, is more transient he had double hernia, erosive gastritis with a small inflammatory polyp in the antrum.  4-Hypertension: Continue with Norvasc.  5-Leukocytosis: Chest x-ray negative.  UA negative for infection.  Right upper quadrant ultrasound negative for cholecystitis.   Hold on antibiotics remain afebrile.  Denies cough, dysuria.  Had loose stool last night, check GI pathogen.  CT abdomen on admission; gallstone, degenerative changes lumbar spine. No evidence of infection.  Lymphocytosis: We will check flow cytometry.  We will follow-up with Dr. Lindi Adie  as an outpatient  6-hypokalemia: Replete orally.     Estimated body mass index is 38.38 kg/m as calculated from the following:   Height as of this encounter: '4\' 11"'  (1.499 m).   Weight as of this encounter: 86.2 kg.   DVT prophylaxis: SCDs Code Status: Full code Family Communication: Care discussed with patient.  I offered to update a family member, patient relate that she will update her family. Disposition Plan: Remain in the hospital for IV fluids, further evaluation of dysphagia and transaminases. Plan to start diet, she will need endoscopy prior to discharge  Consultants:   GI   Procedures:   Right upper quadrant ultrasound:Cholelithiasis and gallbladder sludge.  Negative for cholecystitis     Antimicrobials:   None   Subjective: Denies abdominal pain, she is upset because she wants to eat. Denies cough.   Objective: Vitals:   06/13/19 1110 06/13/19 1225 06/13/19 1240 06/13/19 1255  BP: (!) 179/74 119/72 123/66 131/61  Pulse: 86  81 76  Resp: '20 15 20 16  ' Temp: 98.7 F (37.1 C) 99.1 F (37.3 C)    TempSrc: Oral Temporal    SpO2: 97% 99% 98% 98%  Weight:      Height:        Intake/Output Summary (Last 24 hours) at 06/13/2019 1342 Last data  filed at 06/13/2019 1227 Gross per 24 hour  Intake 3560.18 ml  Output -  Net 3560.18 ml   Filed Weights   06/09/19 1700 06/11/19 0522 06/13/19 1106  Weight: 62.2 kg 86.2 kg 86.2 kg    Examination:  General exam: NAD Respiratory system: CTA Cardiovascular system: S 1, S 2 RRR Gastrointestinal system: BS present, soft, nt Central nervous system: non focal.  Extremities: no edema Skin: no rashes.   Data Reviewed: I have personally reviewed following labs and imaging studies  CBC: Recent Labs  Lab 06/09/19 1234 06/10/19 0036 06/11/19 0756 06/12/19 0742 06/13/19 0521  WBC 13.1* 13.1* 13.6* 17.9* 18.8*  NEUTROABS 8.0*  --  2.8  --  1.8  HGB 13.3 10.2* 9.7* 9.7* 9.6*  HCT 41.3 31.9* 30.3* 31.3* 30.5*  MCV 74.3* 73.8* 73.2* 77.5* 76.4*  PLT 271 208 200 217 599   Basic Metabolic Panel: Recent Labs  Lab 06/09/19 2041 06/10/19 0036 06/10/19 0845 06/11/19 0756 06/12/19 0742 06/13/19 0521  NA 138 135  --  138 141 143  K 3.6 3.3*  --  4.7 3.4* 3.9  CL 103 103  --  110 111 114*  CO2 25 23  --  19* 21* 21*  GLUCOSE 195* 152*  --  130* 122* 111*  BUN 26* 19  --  8 <5* <5*  CREATININE 0.79 0.73  --  0.77 0.64 0.66  CALCIUM 8.1* 7.9*  --  8.0* 8.0* 7.8*  MG  --   --  2.1  --   --   --    GFR: Estimated Creatinine Clearance: 69.5 mL/min (by C-G formula based on SCr of 0.66 mg/dL). Liver Function Tests: Recent Labs  Lab 06/09/19 1234 06/10/19 0036 06/11/19 0756 06/12/19 0742 06/13/19 0521  AST 305* 353* 347* 166* 85*  ALT 312* 337* 362* 261* 193*  ALKPHOS 142* 108 99 98 88  BILITOT 2.6* 1.5* 1.2 0.6 0.5  PROT 7.9 6.3* 5.4* 5.4* 5.1*  ALBUMIN 4.2 3.3* 2.9* 2.9* 2.8*   Recent Labs  Lab 06/09/19 1234  LIPASE 156*   No results for input(s): AMMONIA in the last 168 hours. Coagulation Profile: Recent Labs  Lab 06/10/19 1320  INR 0.9   Cardiac Enzymes: Recent Labs  Lab 06/09/19 1234  TROPONINI <0.03   BNP (last 3 results) No results for input(s): PROBNP  in the last 8760 hours. HbA1C: No results for input(s): HGBA1C in the last 72 hours. CBG: Recent Labs  Lab 06/12/19 0720 06/12/19 1146 06/12/19 1640 06/12/19 2121 06/13/19 0752  GLUCAP 110* 157* 127* 98 116*   Lipid Profile: No results for input(s): CHOL, HDL, LDLCALC, TRIG, CHOLHDL, LDLDIRECT in the last 72 hours. Thyroid Function Tests: No results for input(s): TSH, T4TOTAL, FREET4, T3FREE, THYROIDAB in the last 72 hours. Anemia Panel: Recent Labs    06/10/19 1430  RETICCTPCT 1.8   Sepsis Labs: No results for input(s): PROCALCITON, LATICACIDVEN in the last 168 hours.  Recent Results (from the past 240 hour(s))  SARS Coronavirus 2 (CEPHEID - Performed in [facility] hospital lab), Hosp Order     Status: None   Collection Time: 06/09/19  2:53 PM   Specimen: Nasopharyngeal  Swab  Result Value Ref Range Status   SARS Coronavirus 2 NEGATIVE NEGATIVE Final    Comment: (NOTE) If result is NEGATIVE SARS-CoV-2 target nucleic acids are NOT DETECTED. The SARS-CoV-2 RNA is generally detectable in upper and lower  respiratory specimens during the acute phase of infection. The lowest  concentration of SARS-CoV-2 viral copies this assay can detect is 250  copies / mL. A negative result does not preclude SARS-CoV-2 infection  and should not be used as the sole basis for treatment or other  patient management decisions.  A negative result may occur with  improper specimen collection / handling, submission of specimen other  than nasopharyngeal swab, presence of viral mutation(s) within the  areas targeted by this assay, and inadequate number of viral copies  (<250 copies / mL). A negative result must be combined with clinical  observations, patient history, and epidemiological information. If result is POSITIVE SARS-CoV-2 target nucleic acids are DETECTED. The SARS-CoV-2 RNA is generally detectable in upper and lower  respiratory specimens dur ing the acute phase of infection.   Positive  results are indicative of active infection with SARS-CoV-2.  Clinical  correlation with patient history and other diagnostic information is  necessary to determine patient infection status.  Positive results do  not rule out bacterial infection or co-infection with other viruses. If result is PRESUMPTIVE POSTIVE SARS-CoV-2 nucleic acids MAY BE PRESENT.   A presumptive positive result was obtained on the submitted specimen  and confirmed on repeat testing.  While 2019 novel coronavirus  (SARS-CoV-2) nucleic acids may be present in the submitted sample  additional confirmatory testing may be necessary for epidemiological  and / or clinical management purposes  to differentiate between  SARS-CoV-2 and other Sarbecovirus currently known to infect humans.  If clinically indicated additional testing with an alternate test  methodology [phone]) is advised. The SARS-CoV-2 RNA is generally  detectable in upper and lower respiratory sp ecimens during the acute  phase of infection. The expected result is Negative. Fact Sheet for Patients:  StrictlyIdeas.no Fact Sheet for Healthcare Providers: BankingDealers.co.za This test is not yet approved or cleared by the [place] FDA and has been authorized for detection and/or diagnosis of SARS-CoV-2 by FDA under an Emergency Use Authorization (EUA).  This EUA will remain in effect (meaning this test can be used) for the duration of the COVID-19 declaration under Section 564(b)(1) of the Act, 21 U.S.C. section 360bbb-3(b)(1), unless the authorization is terminated or revoked sooner. Performed at [facility], [place] [address], [place], [place] [address]   MRSA PCR Screening     Status: None   Collection Time: 06/09/19  6:40 PM   Specimen: Nasal Mucosa; Nasopharyngeal  Result Value Ref Range Status   MRSA by PCR NEGATIVE NEGATIVE Final    Comment:        The GeneXpert MRSA  Assay (FDA approved for NASAL specimens only), is one component of a comprehensive MRSA colonization surveillance program. It is not intended to diagnose MRSA infection nor to guide or monitor treatment for MRSA infections. Performed at [facility], [place] [address], [place], [place] [address]   Gastrointestinal Panel by PCR , Stool     Status: None   Collection Time: 06/12/19 11:17 AM   Specimen: Rectum; Stool  Result Value Ref Range Status   Campylobacter species NOT DETECTED NOT DETECTED Final   Plesimonas shigelloides NOT DETECTED NOT DETECTED Final   Salmonella species NOT DETECTED NOT DETECTED Final  Yersinia enterocolitica NOT DETECTED NOT DETECTED Final   Vibrio species NOT DETECTED NOT DETECTED Final   Vibrio cholerae NOT DETECTED NOT DETECTED Final   Enteroaggregative E coli (EAEC) NOT DETECTED NOT DETECTED Final   Enteropathogenic E coli (EPEC) NOT DETECTED NOT DETECTED Final   Enterotoxigenic E coli (ETEC) NOT DETECTED NOT DETECTED Final   Shiga like toxin producing E coli (STEC) NOT DETECTED NOT DETECTED Final   Shigella/Enteroinvasive E coli (EIEC) NOT DETECTED NOT DETECTED Final   Cryptosporidium NOT DETECTED NOT DETECTED Final   Cyclospora cayetanensis NOT DETECTED NOT DETECTED Final   Entamoeba histolytica NOT DETECTED NOT DETECTED Final   Giardia lamblia NOT DETECTED NOT DETECTED Final   Adenovirus F40/41 NOT DETECTED NOT DETECTED Final   Astrovirus NOT DETECTED NOT DETECTED Final   Norovirus GI/GII NOT DETECTED NOT DETECTED Final   Rotavirus A NOT DETECTED NOT DETECTED Final   Sapovirus (I, II, IV, and V) NOT DETECTED NOT DETECTED Final    Comment: Performed at [facility], [address], [place], [place] [address]         Radiology Studies: No results found.      Scheduled Meds: . amLODipine  5 mg Oral Daily  . Chlorhexidine Gluconate Cloth  6 each Topical Daily  . dicyclomine  10 mg Oral TID AC & HS  . enoxaparin  (LOVENOX) injection  40 mg Subcutaneous Q24H  . insulin aspart  0-9 Units Subcutaneous TID WC  . insulin glargine  8 Units Subcutaneous Daily  . insulin starter kit- pen needles  1 kit Other Once  . living well with diabetes book   Does not apply Once  . pantoprazole (PROTONIX) IV  40 mg Intravenous Q12H   Continuous Infusions: . sodium chloride 100 mL/hr at 06/13/19 1016  . sodium chloride 20 mL/hr at 06/11/19 0600     LOS: 4 days    Time spent: 35 minutes.     Elmarie Shiley, MD [facility] Pager [phone]  If 7PM-7AM, please contact night-coverage [email or web address] Password [email or web address] 06/13/2019, 1:42 PM

## 2019-06-13 NOTE — Transfer of Care (Signed)
Immediate Anesthesia Transfer of Care Note  Patient: [facility]  Procedure(s) Performed: ESOPHAGOGASTRODUODENOSCOPY (EGD) WITH PROPOFOL (N/A ) BIOPSY MALONEY DILATION  Patient Location: PACU  Anesthesia Type:MAC  Level of Consciousness: awake, alert , oriented and patient cooperative  Airway & Oxygen Therapy: Patient Spontanous Breathing and Patient connected to nasal cannula oxygen  Post-op Assessment: Report given to RN, Post -op Vital signs reviewed and stable and Patient moving all extremities  Post vital signs: Reviewed and stable  Last Vitals:  Vitals Value Taken Time  BP    Temp    Pulse    Resp    SpO2      Last Pain:  Vitals:   06/13/19 1110  TempSrc: Oral  PainSc:       Patients Stated Pain Goal: 0 (38/38/18 4037)  Complications: No apparent anesthesia complications

## 2019-06-13 NOTE — Op Note (Signed)
[facility] Patient Name: Maria Robinson Procedure Date: 06/13/2019 MRN: [id number] Attending MD: Jackquline Denmark , MD Date of Birth: 08/27/1956 CSN: [id number] Age: 63 Admit Type: Inpatient Procedure:                Upper GI endoscopy Indications:              Epigastric abdominal pain, Dysphagia. History of                            nausea/vomiting. Providers:                Jackquline Denmark, MD, Cleda Daub, RN, Ladona Ridgel, Technician, Courtney Heys [place], CRNA Referring MD:              Medicines:                Propofol per Anesthesia Complications:            No immediate complications. Estimated Blood Loss:     Estimated blood loss: none. Procedure:                Pre-Anesthesia Assessment:                           - Prior to the procedure, a History and Physical                            was performed, and patient medications and                            allergies were reviewed. The patient's tolerance of                            previous anesthesia was also reviewed. The risks                            and benefits of the procedure and the sedation                            options and risks were discussed with the patient.                            All questions were answered, and informed consent                            was obtained. Prior Anticoagulants: The patient has                            taken no previous anticoagulant or antiplatelet                            agents. ASA Grade Assessment: II - A patient with  mild systemic disease. After reviewing the risks                            and benefits, the patient was deemed in                            satisfactory condition to undergo the procedure.                           After obtaining informed consent, the endoscope was                            passed under direct vision. Throughout the                            procedure,  the patient's blood pressure, pulse, and                            oxygen saturations were monitored continuously. The                            GIF-H190 ([id number] Olympus gastroscope was                            introduced through the mouth, and advanced to the                            second part of duodenum. The upper GI endoscopy was                            accomplished without difficulty. The patient                            tolerated the procedure well. Scope In: Scope Out: Findings:      The esophagus was mildly tortuous. The Z line was regular with a single       healed distal esophageal erosion at 35 cm from the incisors. Multiple       biopsies were obtained from proximal, mid and distal esophagus and sent       for histology. It was decided, however, to proceed with dilation of the       entire esophagus. The scope was withdrawn. Dilation was performed with a       Maloney dilator with mild resistance at 50 Fr.      A small hiatal hernia was present. Best visualized, on the full       distention of the lower end of the esophagus.      A single 6 mm erosion was noted found in the gastric antrum with a       likely small 8 mm inflammatory polyp. There were no stigmata of recent       bleeding. Multiple biopsies were taken with a cold forceps for       histology. Estimated blood loss: none.      The examined duodenum was normal. Biopsies for histology were taken with       a cold forceps for  evaluation of celiac disease. Estimated blood loss:       none. Impression:               - Mild presbyesophagus s/p esophageal dilatation.                           - Small transient hiatal hernia.                           - Erosive gastritis with a small inflammatory polyp                            in the antrum. Moderate Sedation:      Not Applicable - Patient had care per Anesthesia. Recommendation:           - Patient has a contact number available for                             emergencies. The signs and symptoms of potential                            delayed complications were discussed with the                            patient. Return to normal activities tomorrow.                            Written discharge instructions were provided to the                            patient.                           - Post dilatation diet.                           - Continue Protonix 40 mg p.o. once a day.                           - Await pathology results.                           - Return patient to hospital ward for ongoing care.                           - If still with epigastric pain, consider lap chole                            (can be done as an outpatient). Procedure Code(s):        --- Professional ---                           [phone], Esophagogastroduodenoscopy, flexible,                            transoral; with biopsy, single or multiple  18984, Dilation of esophagus, by unguided sound or                            bougie, single or multiple passes Diagnosis Code(s):        --- Professional ---                           R13.10, Dysphagia, unspecified                           K44.9, Diaphragmatic hernia without obstruction or                            gangrene                           K31.89, Other diseases of stomach and duodenum                           R10.13, Epigastric pain CPT copyright 2019 American Medical Association. All rights reserved. The codes documented in this report are preliminary and upon coder review may  be revised to meet current compliance requirements. Jackquline Denmark, MD 06/13/2019 12:27:45 PM This report has been signed electronically. Number of Addenda: 0

## 2019-06-13 NOTE — Anesthesia Postprocedure Evaluation (Signed)
Anesthesia Post Note  Patient: Maria Robinson  Procedure(s) Performed: ESOPHAGOGASTRODUODENOSCOPY (EGD) WITH PROPOFOL (N/A ) BIOPSY [place]     Patient location during evaluation: PACU Anesthesia Type: MAC Level of consciousness: awake and alert Pain management: pain level controlled Vital Signs Assessment: post-procedure vital signs reviewed and stable Respiratory status: spontaneous breathing, nonlabored ventilation, respiratory function stable and patient connected to nasal cannula oxygen Cardiovascular status: stable and blood pressure returned to baseline Postop Assessment: no apparent nausea or vomiting Anesthetic complications: no    Last Vitals:  Vitals:   06/13/19 1110 06/13/19 1225  BP: (!) 179/74 119/72  Pulse: 86   Resp: 20 15  Temp: 37.1 C 37.3 C  SpO2: 97% 99%    Last Pain:  Vitals:   06/13/19 1225  TempSrc: Temporal  PainSc: 0-No pain                 Oziel Beitler

## 2019-06-13 NOTE — Plan of Care (Signed)

## 2019-06-14 ENCOUNTER — Telehealth: Payer: Self-pay | Admitting: Hematology and Oncology

## 2019-06-14 ENCOUNTER — Encounter (HOSPITAL_COMMUNITY): Payer: Self-pay | Admitting: Oncology

## 2019-06-14 DIAGNOSIS — R945 Abnormal results of liver function studies: Secondary | ICD-10-CM

## 2019-06-14 LAB — HEPATIC FUNCTION PANEL
ALT: 154 U/L — ABNORMAL HIGH (ref 0–44)
AST: 51 U/L — ABNORMAL HIGH (ref 15–41)
Albumin: 3.3 g/dL — ABNORMAL LOW (ref 3.5–5.0)
Alkaline Phosphatase: 94 U/L (ref 38–126)
Bilirubin, Direct: 0.3 mg/dL — ABNORMAL HIGH (ref 0.0–0.2)
Indirect Bilirubin: 0.5 mg/dL (ref 0.3–0.9)
Total Bilirubin: 0.8 mg/dL (ref 0.3–1.2)
Total Protein: 6 g/dL — ABNORMAL LOW (ref 6.5–8.1)

## 2019-06-14 LAB — BASIC METABOLIC PANEL
Anion gap: 9 (ref 5–15)
BUN: 6 mg/dL — ABNORMAL LOW (ref 8–23)
CO2: 21 mmol/L — ABNORMAL LOW (ref 22–32)
Calcium: 7.9 mg/dL — ABNORMAL LOW (ref 8.9–10.3)
Chloride: 112 mmol/L — ABNORMAL HIGH (ref 98–111)
Creatinine, Ser: 0.77 mg/dL (ref 0.44–1.00)
GFR calc Af Amer: >60 mL/min (ref >60)
GFR calc non Af Amer: >60 mL/min (ref >60)
Glucose, Bld: 178 mg/dL — ABNORMAL HIGH (ref 70–99)
Potassium: 3.5 mmol/L (ref 3.5–5.1)
Sodium: 142 mmol/L (ref 135–145)

## 2019-06-14 LAB — GLUCOSE, CAPILLARY
Glucose-Capillary: 187 mg/dL — ABNORMAL HIGH (ref 70–99)
Glucose-Capillary: 198 mg/dL — ABNORMAL HIGH (ref 70–99)

## 2019-06-14 LAB — CBC
HCT: 33.5 % — ABNORMAL LOW (ref 36.0–46.0)
Hemoglobin: 10.5 g/dL — ABNORMAL LOW (ref 12.0–15.0)
MCH: 24.2 pg — ABNORMAL LOW (ref 26.0–34.0)
MCHC: 31.3 g/dL (ref 30.0–36.0)
MCV: 77.4 fL — ABNORMAL LOW (ref 80.0–100.0)
Platelets: 293 10*3/uL (ref 150–400)
RBC: 4.33 MIL/uL (ref 3.87–5.11)
RDW: 15.9 % — ABNORMAL HIGH (ref 11.5–15.5)
WBC: 17.7 10*3/uL — ABNORMAL HIGH (ref 4.0–10.5)
nRBC: 0.2 % (ref 0.0–0.2)

## 2019-06-14 LAB — FLOW CYTOMETRY REQUEST - FLUID (INPATIENT)

## 2019-06-14 MED ORDER — DICYCLOMINE HCL 10 MG PO CAPS: 10.0000 mg | ORAL_CAPSULE | Freq: Three times a day (TID) | ORAL | 0 refills | Status: DC

## 2019-06-14 MED ORDER — DICYCLOMINE HCL 10 MG PO CAPS: 10.0000 mg | ORAL_CAPSULE | Freq: Three times a day (TID) | ORAL | Status: DC | PRN

## 2019-06-14 MED ORDER — "ANTI-STICK INSULIN SYRINGE 29G X 1/2"" 0.5 ML MISC": 1.0000 "application " | Freq: Once | 0 refills | Status: AC

## 2019-06-14 MED ORDER — BLOOD GLUCOSE METER KIT: PACK | 0 refills | Status: DC

## 2019-06-14 MED ORDER — PANTOPRAZOLE SODIUM 40 MG PO TBEC: 40.0000 mg | DELAYED_RELEASE_TABLET | Freq: Every day | ORAL | 2 refills | Status: DC

## 2019-06-14 MED ORDER — DICYCLOMINE HCL 10 MG PO CAPS: 10.0000 mg | ORAL_CAPSULE | Freq: Three times a day (TID) | ORAL | 0 refills | Status: DC | PRN

## 2019-06-14 MED ORDER — PANTOPRAZOLE SODIUM 40 MG PO TBEC: 40.0000 mg | DELAYED_RELEASE_TABLET | Freq: Every day | ORAL | Status: DC

## 2019-06-14 MED ORDER — INSULIN GLARGINE 100 UNIT/ML ~~LOC~~ SOLN: 8.0000 [IU] | Freq: Every day | SUBCUTANEOUS | 11 refills | Status: DC

## 2019-06-14 MED ORDER — INSULIN STARTER KIT- PEN NEEDLES (ENGLISH): 1.0000 | Freq: Once | 0 refills | Status: AC

## 2019-06-14 MED FILL — PANTOPRAZOLE SOD DR 40 MG T: 40 | 30 days supply | Qty: 30 | Fill #0

## 2019-06-14 MED FILL — !TRUE METRIX BLOOD GLUCOSE: 365 days supply | Qty: 1 | Fill #0

## 2019-06-14 MED FILL — TRUEPLUS PEN NDL 31G X 1/4": 31G X 6 MM | 25 days supply | Qty: 100 | Fill #0

## 2019-06-14 MED FILL — !LANTUS SOLOSTAR 100UNITS/M: 100 | 28 days supply | Qty: 10 | Fill #0

## 2019-06-14 MED FILL — TRUE METRIX TEST STRIP: 25 days supply | Qty: 100 | Fill #0

## 2019-06-14 MED FILL — TRUEplus LANCETS 28G MISC: 25 days supply | Qty: 100 | Fill #0

## 2019-06-14 MED FILL — DICYCLOMINE 10 MG CAPSULE: 10 | 30 days supply | Qty: 90 | Fill #0

## 2019-06-14 MED FILL — TRUEPLUS PEN NDL 31G X 1/4: 31G X 6 MM | 25 days supply | Qty: 100 | Fill #0

## 2019-06-14 NOTE — Progress Notes (Signed)
Brief hematology note:  Consult received for leukocytosis/lymphocytosis.  Peripheral flow cytometry has been drawn and is pending.  The patient is discharging and we have set her up for an outpatient new patient appointment at the [facility].  She will be seen by Dr. Lindi Adie on June 28, 2019 at 2:30 PM.  Of note today, her white blood cell count is down slightly to 17.7 and hemoglobin has improved to 10.5.  Mikey Bussing, DNP, AGPCNP-BC, AOCNP

## 2019-06-14 NOTE — TOC Transition Note (Signed)
Transition of Care [facility]) - CM/SW Discharge Note   Patient Details  Name: Maria Robinson MRN: [id number] Date of Birth: Jan 11, 1956  [facility] of [facility]) CM/SW Contact:  Dessa Phi, RN Phone Number: 06/14/2019, 11:17 AM   Clinical Narrative:  Patient set up w/[facility] for pcp, & pharmacy(must go directly after d/c for a 1 time fill of meds-spoke to [facility] pharmacy-they have meds available.D/c home. Patient has own transp home. No further CM needs.    Final next level of care: Home/Self Care Barriers to Discharge: No Barriers Identified   Patient Goals and CMS Choice Patient states their goals for this hospitalization and ongoing recovery are:: go home      Discharge Placement                       Discharge Plan and Services                                     Social Determinants of Health (SDOH) Interventions     Readmission Risk Interventions No flowsheet data found.

## 2019-06-14 NOTE — Discharge Summary (Signed)
Physician Discharge Summary  [facility]:[id number] DOB: 10-30-1956 DOA: 06/09/2019  PCP: Patient, No Pcp Per  Admit date: 06/09/2019 Discharge date: 06/14/2019  Admitted From: Home  Disposition:  Home   Recommendations for Outpatient Follow-up:  1. Follow up with PCP in 1-2 weeks 2. Please obtain BMP/CBC in one week 3. Please follow up on the following pending results: Flow Cytometry, Hepatitis C RNA.  4. Needs to follow up with Dr Lindi Adie for further evaluation of leukocytosis.  5. Follow up with GI for further evaluation of Transaminases.  6. Further titration of insulin regimen.   Home Health: none  Discharge Condition; stable.  CODE STATUS: full code.  Diet recommendation: Heart Healthy  Brief/Interim Summary: 63 year old with past medical history significant for hypertension who present with multiple complaints.  Patient reports feeling weak, poor appetite.  Report of nausea and vomiting.  She has also been complaining of abdominal pain radiating to her back.  She also report increase thirsty and dry mouth.  She denies bowel movement since 3 days prior to admission.  Evaluation in the ED patient was found to have acute kidney injury, hyponatremia, hyperglycemia with DKA, elevated  liver enzymes   1-DKA: New diagnosis for diabetes. Hemoglobin A1c:12 Patient was initially treated with IV fluids, insulin drip.  Her gap is close blood sugar has decreased.  She was transitioned to Lantus 10 units.  Continue with a sliding scale insulin. Diabetic  educator will discuss diet with patient Improved. Discharge on lantus.   2-Transaminases; Abdominal pain, nausea vomiting.  Nausea vomiting could have be related to DKA.  But patient presents with mildly increased lipase and transaminases.  Gallbladder with cholelithiasis. Hepatitis C antibody positive.   quantitative hepatitis C level RNA pending.  GI has been consulted. For abdominal pain started on Bentyl PRN and miralax.   S/P endoscopy; showed presbyesophagus status post esophageal dilation, is more transient he had double hernia, erosive gastritis with a small inflammatory polyp in the antrum. -Per GI if patient develops fever abdominal pain and vomiting she will require evaluation for cholecystectomy. patinet aware.  -Liver function tests trending down  3-Dysphagia to liquids: GI consulted.  Started  PPI. S/P endoscopy; showed presbyesophagus status post esophageal dilation, is more transient he had double hernia, erosive gastritis with a small inflammatory polyp in the antrum.  4-Hypertension: hold medication at discharge. SBP has been 120 range.   5-Leukocytosis: Chest x-ray negative.  UA negative for infection.  Right upper quadrant ultrasound negative for cholecystitis.   Hold on antibiotics remain afebrile.  Denies cough, dysuria.  Had loose stool last night, check GI pathogen.  CT abdomen on admission; gallstone, degenerative changes lumbar spine. No evidence of infection.  Lymphocytosis: We will check flow cytometry.  We will follow-up with Dr. Lindi Adie  as an outpatient WBC stable.   6-hypokalemia: Replete orally.     Discharge Diagnoses:  Principal Problem:   DKA (diabetic ketoacidoses) ([place]) Active Problems:   DM (diabetes mellitus) ([place])   AKI (acute kidney injury) ([place])   Elevated liver enzymes   Hyponatremia   Muscle weakness (generalized)   Elevated LFTs   Lower abdominal pain   Esophageal dysphagia    Discharge Instructions  Discharge Instructions    Ambulatory referral to Nutrition and Diabetic Education   Complete by: As directed    Diet Carb Modified   Complete by: As directed    Increase activity slowly   Complete by: As directed      Allergies as of 06/14/2019  Reactions   Penicillins Swelling, Rash   Did it involve swelling of the face/tongue/throat, SOB, or low BP? y Did it involve sudden or severe rash/hives, skin peeling, or any reaction on the inside  of your mouth or nose? y Did you need to seek medical attention at a hospital or doctor's office? y When did it last happen? 2026 If all above answers are "NO", may proceed with cephalosporin use.      Medication List    STOP taking these medications   amLODipine 10 MG tablet Commonly known as: NORVASC   hydrochlorothiazide 25 MG tablet Commonly known as: HYDRODIURIL   Klor-Con M10 10 MEQ tablet Generic drug: potassium chloride   ondansetron 4 MG tablet Commonly known as: ZOFRAN     TAKE these medications   ANTI-STICK INS SYR .5CC/29G 29G X 1/2" 0.5 ML Misc 1 application by Does not apply route once for 1 dose.   blood glucose meter kit and supplies Dispense based on patient and insurance preference. Use up to four times daily as directed. (FOR ICD-10 E10.9, E11.9).   cetirizine 5 MG tablet Commonly known as: ZYRTEC Take 5 mg by mouth daily.   dicyclomine 10 MG capsule Commonly known as: BENTYL Take 1 capsule (10 mg total) by mouth 3 (three) times daily between meals as needed for spasms.   insulin glargine 100 UNIT/ML injection Commonly known as: Lantus Inject 0.08 mLs (8 Units total) into the skin daily.   insulin starter kit- pen needles Misc 1 kit by Other route once for 1 dose.   pantoprazole 40 MG tablet Commonly known as: Protonix Take 1 tablet (40 mg total) by mouth daily.   polyethylene glycol powder 17 GM/SCOOP powder Commonly known as: GLYCOLAX/MIRALAX Take 17 g by mouth 2 (two) times daily as needed for constipation.      Follow-up Information    Nicholas Lose, MD Follow up in 1 week(s).   Specialty: Hematology and Oncology Why: please call office to arrange appointment for follow up for Leukocytosis.  Contact information: [place] [id number] [place] Follow up on 06/21/2019.   Why: @ 2:30p pcp appt. -go directly to the pharmacy @ d/c for a 1 time  fill of meds. Contact information: [place] [phone]         Allergies  Allergen Reactions  . Penicillins Swelling and Rash    Did it involve swelling of the face/tongue/throat, SOB, or low BP? y Did it involve sudden or severe rash/hives, skin peeling, or any reaction on the inside of your mouth or nose? y Did you need to seek medical attention at a hospital or doctor's office? y When did it last happen? 2026 If all above answers are "NO", may proceed with cephalosporin use.    Consultations:  GI  Oncology   Procedures/Studies: Ct Abdomen Pelvis Wo Contrast  Result Date: 06/09/2019 CLINICAL DATA:  Abdominal pain and low back pain for 2 months. EXAM: CT ABDOMEN AND PELVIS WITHOUT CONTRAST TECHNIQUE: Multidetector CT imaging of the abdomen and pelvis was performed following the standard protocol without IV contrast. COMPARISON:  None. FINDINGS: Lower chest: Clear lung bases.  Heart normal in size Hepatobiliary: Normal liver. Few small stones in the gallbladder fundus. No gallbladder wall thickening or inflammation. No bile duct dilation Pancreas: Unremarkable. No pancreatic ductal dilatation or surrounding inflammatory changes. Spleen: Normal in size without focal  abnormality. Adrenals/Urinary Tract: Adrenal glands are unremarkable. Kidneys are normal, without renal calculi, focal lesion, or hydronephrosis. Bladder is unremarkable. Stomach/Bowel: Stomach is within normal limits. Appendix appears normal. No evidence of bowel wall thickening, distention, or inflammatory changes. Vascular/Lymphatic: Aortic atherosclerotic calcifications. No aneurysm. No enlarged lymph nodes. Reproductive: Uterus and bilateral adnexa are unremarkable. Other: No abdominal wall hernia or abnormality. No abdominopelvic ascites. Musculoskeletal: No fracture or acute finding. No osteoblastic or osteolytic lesions. Degenerative changes noted of the  visualized spine. IMPRESSION: 1. No acute findings. 2. Degenerative changes of the lumbar spine may account for patient's low back pain 3. Small gallstones.  No acute cholecystitis. 4. Aortic atherosclerosis. Electronically Signed   By: Lajean Manes M.D.   On: 06/09/2019 15:03   Dg Chest 2 View  Result Date: 06/09/2019 CLINICAL DATA:  Nausea, vomiting and abdominal pain. EXAM: CHEST - 2 VIEW COMPARISON:  None. FINDINGS: Cardiac silhouette is normal in size. Normal mediastinal and hilar contours. Clear lungs.  No pleural effusion or pneumothorax. Skeletal structures are intact. IMPRESSION: No active cardiopulmonary disease. Electronically Signed   By: Lajean Manes M.D.   On: 06/09/2019 15:04   US Abdomen Limited Ruq  Result Date: 06/09/2019 CLINICAL DATA:  Elevated LFT EXAM: ULTRASOUND ABDOMEN LIMITED RIGHT UPPER QUADRANT COMPARISON:  CT abdomen pelvis 06/09/2019 FINDINGS: Gallbladder: Small gallstones. Gallbladder sludge. Probable 4 mm gallbladder polyp. Gallbladder wall non thickened. Negative sonographic Murphy sign. Common bile duct: Diameter: 2.9 mm Liver: No focal lesion identified. Within normal limits in parenchymal echogenicity. Portal vein is patent on color Doppler imaging with normal direction of blood flow towards the liver. IMPRESSION: Cholelithiasis and gallbladder sludge.  Negative for cholecystitis. Electronically Signed   By: Franchot Gallo M.D.   On: 06/09/2019 18:32    Subjective: Feeling well. Tolerating regular diet. No abdominal pain   Discharge Exam: Vitals:   06/14/19 1006 06/14/19 1417  BP: 106/63 120/77  Pulse:  85  Resp:  14  Temp:  98.2 F (36.8 C)  SpO2:  100%     General: Pt is alert, awake, not in acute distress Cardiovascular: RRR, S1/S2 +, no rubs, no gallops Respiratory: CTA bilaterally, no wheezing, no rhonchi Abdominal: Soft, NT, ND, bowel sounds + Extremities: no edema, no cyanosis    The results of significant diagnostics from this  hospitalization (including imaging, microbiology, ancillary and laboratory) are listed below for reference.     Microbiology: Recent Results (from the past 240 hour(s))  SARS Coronavirus 2 (CEPHEID - Performed in [place] hospital lab), Hosp Order     Status: None   Collection Time: 06/09/19  2:53 PM   Specimen: Nasopharyngeal Swab  Result Value Ref Range Status   SARS Coronavirus 2 NEGATIVE NEGATIVE Final    Comment: (NOTE) If result is NEGATIVE SARS-CoV-2 target nucleic acids are NOT DETECTED. The SARS-CoV-2 RNA is generally detectable in upper and lower  respiratory specimens during the acute phase of infection. The lowest  concentration of SARS-CoV-2 viral copies this assay can detect is 250  copies / mL. A negative result does not preclude SARS-CoV-2 infection  and should not be used as the sole basis for treatment or other  patient management decisions.  A negative result may occur with  improper specimen collection / handling, submission of specimen other  than nasopharyngeal swab, presence of viral mutation(s) within the  areas targeted by this assay, and inadequate number of viral copies  (<250 copies / mL). A negative result must be combined with clinical  observations, patient history, and epidemiological information. If result is POSITIVE SARS-CoV-2 target nucleic acids are DETECTED. The SARS-CoV-2 RNA is generally detectable in upper and lower  respiratory specimens dur ing the acute phase of infection.  Positive  results are indicative of active infection with SARS-CoV-2.  Clinical  correlation with patient history and other diagnostic information is  necessary to determine patient infection status.  Positive results do  not rule out bacterial infection or co-infection with other viruses. If result is PRESUMPTIVE POSTIVE SARS-CoV-2 nucleic acids MAY BE PRESENT.   A presumptive positive result was obtained on the submitted specimen  and confirmed on repeat testing.   While 2019 novel coronavirus  (SARS-CoV-2) nucleic acids may be present in the submitted sample  additional confirmatory testing may be necessary for epidemiological  and / or clinical management purposes  to differentiate between  SARS-CoV-2 and other Sarbecovirus currently known to infect humans.  If clinically indicated additional testing with an alternate test  methodology [phone]) is advised. The SARS-CoV-2 RNA is generally  detectable in upper and lower respiratory sp ecimens during the acute  phase of infection. The expected result is Negative. Fact Sheet for Patients:  StrictlyIdeas.no Fact Sheet for Healthcare Providers: BankingDealers.co.za This test is not yet approved or cleared by the [place] FDA and has been authorized for detection and/or diagnosis of SARS-CoV-2 by FDA under an Emergency Use Authorization (EUA).  This EUA will remain in effect (meaning this test can be used) for the duration of the COVID-19 declaration under Section 564(b)(1) of the Act, 21 U.S.C. section 360bbb-3(b)(1), unless the authorization is terminated or revoked sooner. Performed at [facility], [place] [address], [place], [place] [address]   MRSA PCR Screening     Status: None   Collection Time: 06/09/19  6:40 PM   Specimen: Nasal Mucosa; Nasopharyngeal  Result Value Ref Range Status   MRSA by PCR NEGATIVE NEGATIVE Final    Comment:        The GeneXpert MRSA Assay (FDA approved for NASAL specimens only), is one component of a comprehensive MRSA colonization surveillance program. It is not intended to diagnose MRSA infection nor to guide or monitor treatment for MRSA infections. Performed at [facility], [place] [address] [place], [place] [address]   Gastrointestinal Panel by PCR , Stool     Status: None   Collection Time: 06/12/19 11:17 AM   Specimen: Rectum; Stool  Result Value Ref Range Status    Campylobacter species NOT DETECTED NOT DETECTED Final   Plesimonas shigelloides NOT DETECTED NOT DETECTED Final   Salmonella species NOT DETECTED NOT DETECTED Final   Yersinia enterocolitica NOT DETECTED NOT DETECTED Final   Vibrio species NOT DETECTED NOT DETECTED Final   Vibrio cholerae NOT DETECTED NOT DETECTED Final   Enteroaggregative E coli (EAEC) NOT DETECTED NOT DETECTED Final   Enteropathogenic E coli (EPEC) NOT DETECTED NOT DETECTED Final   Enterotoxigenic E coli (ETEC) NOT DETECTED NOT DETECTED Final   Shiga like toxin producing E coli (STEC) NOT DETECTED NOT DETECTED Final   Shigella/Enteroinvasive E coli (EIEC) NOT DETECTED NOT DETECTED Final   Cryptosporidium NOT DETECTED NOT DETECTED Final   Cyclospora cayetanensis NOT DETECTED NOT DETECTED Final   Entamoeba histolytica NOT DETECTED NOT DETECTED Final   Giardia lamblia NOT DETECTED NOT DETECTED Final   Adenovirus F40/41 NOT DETECTED NOT DETECTED Final   Astrovirus NOT DETECTED NOT DETECTED Final   Norovirus GI/GII NOT DETECTED NOT DETECTED Final  Rotavirus A NOT DETECTED NOT DETECTED Final   Sapovirus (I, II, IV, and V) NOT DETECTED NOT DETECTED Final    Comment: Performed at [facility], [place] [place], [place] [address]     Labs: BNP (last 3 results) No results for input(s): BNP in the last 8760 hours. Basic Metabolic Panel: Recent Labs  Lab 06/10/19 0036 06/10/19 0845 06/11/19 0756 06/12/19 0742 06/13/19 0521 06/14/19 0807  NA 135  --  138 141 143 142  K 3.3*  --  4.7 3.4* 3.9 3.5  CL 103  --  110 111 114* 112*  CO2 23  --  19* 21* 21* 21*  GLUCOSE 152*  --  130* 122* 111* 178*  BUN 19  --  8 <5* <5* 6*  CREATININE 0.73  --  0.77 0.64 0.66 0.77  CALCIUM 7.9*  --  8.0* 8.0* 7.8* 7.9*  MG  --  2.1  --   --   --   --    Liver Function Tests: Recent Labs  Lab 06/10/19 0036 06/11/19 0756 06/12/19 0742 06/13/19 0521 06/14/19 0807  AST 353* 347* 166* 85* 51*  ALT 337* 362* 261*  193* 154*  ALKPHOS 108 99 98 88 94  BILITOT 1.5* 1.2 0.6 0.5 0.8  PROT 6.3* 5.4* 5.4* 5.1* 6.0*  ALBUMIN 3.3* 2.9* 2.9* 2.8* 3.3*   Recent Labs  Lab 06/09/19 1234  LIPASE 156*   No results for input(s): AMMONIA in the last 168 hours. CBC: Recent Labs  Lab 06/09/19 1234 06/10/19 0036 06/11/19 0756 06/12/19 0742 06/13/19 0521 06/14/19 0807  WBC 13.1* 13.1* 13.6* 17.9* 18.8* 17.7*  NEUTROABS 8.0*  --  2.8  --  1.8  --   HGB 13.3 10.2* 9.7* 9.7* 9.6* 10.5*  HCT 41.3 31.9* 30.3* 31.3* 30.5* 33.5*  MCV 74.3* 73.8* 73.2* 77.5* 76.4* 77.4*  PLT 271 208 200 217 248 293   Cardiac Enzymes: Recent Labs  Lab 06/09/19 1234  TROPONINI <0.03   BNP: Invalid input(s): POCBNP CBG: Recent Labs  Lab 06/13/19 1425 06/13/19 1714 06/13/19 2137 06/14/19 0739 06/14/19 1145  GLUCAP 107* 145* 131* 187* 198*   D-Dimer No results for input(s): DDIMER in the last 72 hours. Hgb A1c No results for input(s): HGBA1C in the last 72 hours. Lipid Profile No results for input(s): CHOL, HDL, LDLCALC, TRIG, CHOLHDL, LDLDIRECT in the last 72 hours. Thyroid function studies No results for input(s): TSH, T4TOTAL, T3FREE, THYROIDAB in the last 72 hours.  Invalid input(s): FREET3 Anemia work up No results for input(s): VITAMINB12, FOLATE, FERRITIN, TIBC, IRON, RETICCTPCT in the last 72 hours. Urinalysis    Component Value Date/Time   COLORURINE YELLOW 06/09/2019 1328   APPEARANCEUR CLEAR 06/09/2019 1328   LABSPEC 1.022 06/09/2019 1328   PHURINE 5.0 06/09/2019 1328   GLUCOSEU >=500 (A) 06/09/2019 1328   HGBUR NEGATIVE 06/09/2019 1328   BILIRUBINUR NEGATIVE 06/09/2019 1328   KETONESUR 20 (A) 06/09/2019 1328   PROTEINUR NEGATIVE 06/09/2019 1328   NITRITE NEGATIVE 06/09/2019 1328   LEUKOCYTESUR NEGATIVE 06/09/2019 1328   Sepsis Labs Invalid input(s): PROCALCITONIN,  WBC,  LACTICIDVEN Microbiology Recent Results (from the past 240 hour(s))  SARS Coronavirus 2 (CEPHEID - Performed in [place] hospital lab), Hosp Order     Status: None   Collection Time: 06/09/19  2:53 PM   Specimen: Nasopharyngeal Swab  Result Value Ref Range Status   SARS Coronavirus 2 NEGATIVE NEGATIVE Final    Comment: (NOTE) If result is NEGATIVE  SARS-CoV-2 target nucleic acids are NOT DETECTED. The SARS-CoV-2 RNA is generally detectable in upper and lower  respiratory specimens during the acute phase of infection. The lowest  concentration of SARS-CoV-2 viral copies this assay can detect is 250  copies / mL. A negative result does not preclude SARS-CoV-2 infection  and should not be used as the sole basis for treatment or other  patient management decisions.  A negative result may occur with  improper specimen collection / handling, submission of specimen other  than nasopharyngeal swab, presence of viral mutation(s) within the  areas targeted by this assay, and inadequate number of viral copies  (<250 copies / mL). A negative result must be combined with clinical  observations, patient history, and epidemiological information. If result is POSITIVE SARS-CoV-2 target nucleic acids are DETECTED. The SARS-CoV-2 RNA is generally detectable in upper and lower  respiratory specimens dur ing the acute phase of infection.  Positive  results are indicative of active infection with SARS-CoV-2.  Clinical  correlation with patient history and other diagnostic information is  necessary to determine patient infection status.  Positive results do  not rule out bacterial infection or co-infection with other viruses. If result is PRESUMPTIVE POSTIVE SARS-CoV-2 nucleic acids MAY BE PRESENT.   A presumptive positive result was obtained on the submitted specimen  and confirmed on repeat testing.  While 2019 novel coronavirus  (SARS-CoV-2) nucleic acids may be present in the submitted sample  additional confirmatory testing may be necessary for epidemiological  and / or clinical management purposes  to  differentiate between  SARS-CoV-2 and other Sarbecovirus currently known to infect humans.  If clinically indicated additional testing with an alternate test  methodology [phone]) is advised. The SARS-CoV-2 RNA is generally  detectable in upper and lower respiratory sp ecimens during the acute  phase of infection. The expected result is Negative. Fact Sheet for Patients:  StrictlyIdeas.no Fact Sheet for Healthcare Providers: BankingDealers.co.za This test is not yet approved or cleared by the [place] FDA and has been authorized for detection and/or diagnosis of SARS-CoV-2 by FDA under an Emergency Use Authorization (EUA).  This EUA will remain in effect (meaning this test can be used) for the duration of the COVID-19 declaration under Section 564(b)(1) of the Act, 21 U.S.C. section 360bbb-3(b)(1), unless the authorization is terminated or revoked sooner. Performed at [facility], [place] [address], [place], [place] [address]   MRSA PCR Screening     Status: None   Collection Time: 06/09/19  6:40 PM   Specimen: Nasal Mucosa; Nasopharyngeal  Result Value Ref Range Status   MRSA by PCR NEGATIVE NEGATIVE Final    Comment:        The GeneXpert MRSA Assay (FDA approved for NASAL specimens only), is one component of a comprehensive MRSA colonization surveillance program. It is not intended to diagnose MRSA infection nor to guide or monitor treatment for MRSA infections. Performed at [facility], [place] [address], [place], [place] [address]   Gastrointestinal Panel by PCR , Stool     Status: None   Collection Time: 06/12/19 11:17 AM   Specimen: Rectum; Stool  Result Value Ref Range Status   Campylobacter species NOT DETECTED NOT DETECTED Final   Plesimonas shigelloides NOT DETECTED NOT DETECTED Final   Salmonella species NOT DETECTED NOT DETECTED Final   Yersinia enterocolitica NOT DETECTED  NOT DETECTED Final   Vibrio species NOT DETECTED NOT DETECTED Final   Vibrio cholerae NOT DETECTED NOT  DETECTED Final   Enteroaggregative E coli (EAEC) NOT DETECTED NOT DETECTED Final   Enteropathogenic E coli (EPEC) NOT DETECTED NOT DETECTED Final   Enterotoxigenic E coli (ETEC) NOT DETECTED NOT DETECTED Final   Shiga like toxin producing E coli (STEC) NOT DETECTED NOT DETECTED Final   Shigella/Enteroinvasive E coli (EIEC) NOT DETECTED NOT DETECTED Final   Cryptosporidium NOT DETECTED NOT DETECTED Final   Cyclospora cayetanensis NOT DETECTED NOT DETECTED Final   Entamoeba histolytica NOT DETECTED NOT DETECTED Final   Giardia lamblia NOT DETECTED NOT DETECTED Final   Adenovirus F40/41 NOT DETECTED NOT DETECTED Final   Astrovirus NOT DETECTED NOT DETECTED Final   Norovirus GI/GII NOT DETECTED NOT DETECTED Final   Rotavirus A NOT DETECTED NOT DETECTED Final   Sapovirus (I, II, IV, and V) NOT DETECTED NOT DETECTED Final    Comment: Performed at [facility], [address], [place], [place] [address]     Time coordinating discharge: 40 minutes  SIGNED:   Elmarie Shiley, MD  [facility]

## 2019-06-14 NOTE — Progress Notes (Signed)
Inpatient Diabetes Program Recommendations  AACE/ADA: New Consensus Statement on Inpatient Glycemic Control (2015)  Target Ranges:  Prepandial:   less than 140 mg/dL      Peak postprandial:   less than 180 mg/dL (1-2 hours)      Critically ill patients:  140 - 180 mg/dL   Lab Results  Component Value Date   GLUCAP 187 (H) 06/14/2019   HGBA1C 12.1 (H) 06/09/2019    Review of Glycemic Control - LATE ENTRY  Educated patient on insulin pen use at home. Reviewed contents of insulin flexpen starter kit. Reviewed all steps if insulin pen including attachment of needle, 2-unit air shot, dialing up dose, giving injection, removing needle, disposal of sharps, storage of unused insulin, disposal of insulin etc. Patient able to provide successful return demonstration. Also reviewed troubleshooting with insulin pen. MD to give patient Rxs for insulin pens and insulin pen needles.  Long conversation regarding new diagnosis of DM. Pt's main concern is obtaining a PCP. Will order care management consult for same. Pt very motivated to make dietary changes and willing to check blood sugars 3-4x/day.   Inpatient Diabetes Program Recommendations:     Agree with discharging on Lantus 8 units QD. F/U with logbook of blood sugars to your PCP for diabetes management.  Will need prescription for insulin pen, pen needles and blood glucose monitoring kit. Ordered OP Diabetes Education for new-onset DM.   Thank you. Lorenda Peck, RD, LDN, CDE Inpatient Diabetes Coordinator [phone]

## 2019-06-14 NOTE — Progress Notes (Signed)
Physical Therapy Treatment Patient Details Name: Maria Robinson MRN: [id number] DOB: Jul 13, 1956 Today's Date: 06/14/2019    History of Present Illness 63 yo female adm with  Abdominal pain, lower back pain, generalized weakness, Found to have acute kidney injury, hyponatremia, hyperglycemia with DKA, elevated liver enzymes.    PT Comments    Pt able to ambulate holding onto IV x 250', good mechanics, no LOB episodes.  Slightly limited by fatigue at EOS.  Pt left in chair, RN aware of status.    Follow Up Recommendations  No PT follow up     Equipment Recommendations  None recommended by PT    Recommendations for Other Services       Precautions / Restrictions Precautions Precautions: None Restrictions Weight Bearing Restrictions: No    Mobility  Bed Mobility               General bed mobility comments: Pt standing in room upon entrance  Transfers                    Ambulation/Gait Ambulation/Gait assistance: Min guard Gait Distance (Feet): 250 Feet Assistive device: None;IV Pole Gait Pattern/deviations: Step-through pattern     General Gait Details: for safety. fair gait speed. No LOB.   Stairs             Wheelchair Mobility    Modified Rankin (Stroke Patients Only)       Balance                                            Cognition Arousal/Alertness: Awake/alert Behavior During Therapy: WFL for tasks assessed/performed Overall Cognitive Status: Within Functional Limits for tasks assessed                                        Exercises      General Comments        Pertinent Vitals/Pain Pain Assessment: No/denies pain    Home Living                      Prior Function            PT Goals (current goals can now be found in the care plan section)      Frequency    Min 3X/week      PT Plan Current plan remains appropriate    Co-evaluation               AM-PAC PT "6 Clicks" Mobility   Outcome Measure  Help needed turning from your back to your side while in a flat bed without using bedrails?: A Little Help needed moving from lying on your back to sitting on the side of a flat bed without using bedrails?: A Little Help needed moving to and from a bed to a chair (including a wheelchair)?: A Little Help needed standing up from a chair using your arms (e.g., wheelchair or bedside chair)?: A Little Help needed to walk in hospital room?: A Little Help needed climbing 3-5 steps with a railing? : A Little 6 Click Score: 18    End of Session Equipment Utilized During Treatment: Gait belt Activity Tolerance: Patient tolerated treatment well Patient left: in chair;with call bell/phone within reach Nurse Communication: Mobility status  PT Visit Diagnosis: Unsteadiness on feet (R26.81)     Time: 3212-2482 PT Time Calculation (min) (ACUTE ONLY): 13 min  Charges:  $Gait Training: 8-22 mins                     [address], [place]; [place]  Aldona Lento 06/14/2019, 12:52 PM

## 2019-06-14 NOTE — Plan of Care (Signed)

## 2019-06-14 NOTE — Progress Notes (Addendum)
Patient ID: Maria Robinson, female   DOB: 04/07/1956, 63 y.o.   MRN: [id number]    Progress Note   Subjective  Day #5  EGD yesterday -small Hiatal hernia, healed esophageal erosion, mild presbyesophagus-[facility] dilated, and a small inflammatory appearing polyp/gastric biopsied  Hepatic panel today-bili 0.8/AST 51/ALT 154 ANA negative Ferritin 1046 Hep C RNA quant pending, hep C RNA qualitative positive  Abdominal ultrasound 06/09/2019 with small gallstones, gallbladder sludge probable 4 mm gallbladder polyp no gallbladder wall thickening, liver appears normal   Objective   Vital signs in last 24 hours: Temp:  [98 F (36.7 C)-99.1 F (37.3 C)] 98 F (36.7 C) (06/23 0410) Pulse Rate:  [76-86] 84 (06/23 0410) Resp:  [15-20] 20 (06/23 0410) BP: (90-179)/(53-81) 106/63 (06/23 1006) SpO2:  [97 %-99 %] 97 % (06/23 0410) Weight:  [86.2 kg] 86.2 kg (06/22 1106) Last BM Date: 06/12/19 General:    AA female in NAD Heart:  Regular rate and rhythm; no murmurs Lungs: Respirations even and unlabored, lungs CTA bilaterally Abdomen:  Soft, nontender and nondistended. Normal bowel sounds. Extremities:  Without edema. Neurologic:  Alert and oriented,  grossly normal neurologically. Psych:  Cooperative. Normal mood and affect.  Intake/Output from previous day: 06/22 0701 - 06/23 0700 In: 2167.2 [P.O.:240; I.V.:1927.2] Out: -  Intake/Output this shift: Total I/O In: 150 [P.O.:150] Out: -   Lab Results: Recent Labs    06/12/19 0742 06/13/19 0521 06/14/19 0807  WBC 17.9* 18.8* 17.7*  HGB 9.7* 9.6* 10.5*  HCT 31.3* 30.5* 33.5*  PLT 217 248 293   BMET Recent Labs    06/12/19 0742 06/13/19 0521 06/14/19 0807  NA 141 143 142  K 3.4* 3.9 3.5  CL 111 114* 112*  CO2 21* 21* 21*  GLUCOSE 122* 111* 178*  BUN <5* <5* 6*  CREATININE 0.64 0.66 0.77  CALCIUM 8.0* 7.8* 7.9*   LFT Recent Labs    06/14/19 0807  PROT 6.0*  ALBUMIN 3.3*  AST 51*  ALT 154*  ALKPHOS 94  BILITOT  0.8  BILIDIR 0.3*  IBILI 0.5   PT/INR No results for input(s): LABPROT, INR in the last 72 hours.      Assessment / Plan:    #74 63 year old female with complaint of dysphasia-found to have mild presbyesophagus at EGD yesterday and was empirically [facility] dilated.  #2 erosive gastropathy-biopsies negative for H. Pylori  #3 mild transaminitis-significantly improved since admission--she does have gallstones and bladder sludge, with complaints of epigastric pain prior to admission times may be consistent with biliary colic.  No ductal dilation on CT or ultrasound  Patient also has positive hep C qualitative-hep C quant and hep C genotype pending ANA negative Ferritin greater than 1000-we will need to repeat in nonacute setting  #4 anemia stable #5 constipation-improved on MiraLAX  Plan; low-fat diet Patient continues to have episodic epigastric pain she will require cholecystectomy Please discharge on Protonix 40 mg p.o. every morning/refills Patient will need follow-up office visit with Dr. Lyndel Safe in 2 to 3 months.  Our office will arrange appointment. We will follow-up on pending labs, and will also send autoimmune hepatic markers to complete work-up. GI will sign off, available for problems.           Principal Problem:   DKA (diabetic ketoacidoses) (HCC) Active Problems:   DM (diabetes mellitus) ([place])   AKI (acute kidney injury) ([place])   Elevated liver enzymes   Hyponatremia   Muscle weakness (generalized)   Elevated LFTs   Lower  abdominal pain   Esophageal dysphagia     LOS: 5 days   Amy EsterwoodPA-C  06/14/2019, 10:52 AM      Attending physician's note   I have taken an interval history, reviewed the chart and examined the patient. I agree with the Advanced Practitioner's note, impression and recommendations.   No further abdominal pain or dysphagia. Bx- neg for HP. LFTs improving. [place] did show gallstones.  Plan: -Advance diet. -Continue Protonix 40  mg p.o. once a day. -Follow hepatitis C RNA viral load/genotype/autoimmune work-up. -She is to let us know if she has any further epigastric pain.  If any further epigastric pain, would recommend lap chole. -Follow-up in the GI clinic in 2-3 months.  Earlier, if still with any new problems. -We will sign off for now.  Carmell Austria, MD [facility] [phone].

## 2019-06-14 NOTE — Telephone Encounter (Signed)
Received a staff msg from [employer] to sch a hosp fu appt w/Dr. Lindi Adie for leukocytosis. Pt has been scheduled to see Dr. Lindi Adie on 7/7 at 230pm. Letter mailed.

## 2019-06-15 LAB — MITOCHONDRIAL ANTIBODIES: Mitochondrial M2 Ab, IgG: <20 Units (ref 0.0–20.0)

## 2019-06-15 LAB — HCV RNA QUANT
HCV Quantitative Log: 5.326 log10 IU/mL (ref >1.70)
HCV Quantitative: 212000 IU/mL (ref >50)

## 2019-06-15 LAB — ANTI-SMOOTH MUSCLE ANTIBODY, IGG: F-Actin IgG: 4 Units (ref 0–19)

## 2019-06-15 SURGERY — ENTEROSCOPY
Anesthesia: Monitor Anesthesia Care

## 2019-06-21 ENCOUNTER — Encounter: Payer: Self-pay | Admitting: Nurse Practitioner

## 2019-06-21 ENCOUNTER — Encounter: Payer: Self-pay | Admitting: Hematology and Oncology

## 2019-06-21 ENCOUNTER — Ambulatory Visit: Payer: Self-pay | Attending: Nurse Practitioner | Admitting: Nurse Practitioner

## 2019-06-21 ENCOUNTER — Other Ambulatory Visit: Payer: Self-pay

## 2019-06-21 DIAGNOSIS — E1165 Type 2 diabetes mellitus with hyperglycemia: Secondary | ICD-10-CM

## 2019-06-21 NOTE — Progress Notes (Signed)
Virtual Visit via Telephone Note Due to national recommendations of social distancing due to [place] 19, telehealth visit is felt to be most appropriate for this patient at this time.  I discussed the limitations, risks, security and privacy concerns of performing an evaluation and management service by telephone and the availability of in person appointments. I also discussed with the patient that there may be a patient responsible charge related to this service. The patient expressed understanding and agreed to proceed.    I connected with Joneisha Reisig on 06/21/19  at   2:30 PM EDT  EDT by telephone and verified that I am speaking with the correct person using two identifiers.   Consent I discussed the limitations, risks, security and privacy concerns of performing an evaluation and management service by telephone and the availability of in person appointments. I also discussed with the patient that there may be a patient responsible charge related to this service. The patient expressed understanding and agreed to proceed.   Location of Patient: Private  Residence   Location of Provider: [facility] [employer] Office    Persons participating in Telemedicine visit: Lalena Salas FNP-BC [place]    History of Present Illness: Telemedicine visit for: Establish Care  has a past medical history of Diabetes mellitus without complication ([place]), Hypertension, and Low blood potassium.   Hospital F/U 06-09-2019 through 06-14-2019   DKA Discharged home on 10 units of Lantus however currently taking 8 units daily  Elevated LFTS S/P endoscopy; showedpresbyesophagus status post esophageal dilation, is more transient he had double hernia, erosive gastritis with a small inflammatory polyp in the antrum. Noted for dysphagia to liquids.  She was started on PPI on discharge.  She has follow up with GI on 06-28-2019  Today she denies any abdominal pain, nausea,  vomiting, or diarrhea. Taking bentyl, protonix and miralax as prescribed.   DM TYPE 2 Newly diagnosed. She has never been under the direct care of a PCP. Used urgent care and ED in the past. Currently monitoring her blood glucose levels fasting and postprandial for each meal. Fasting average 138-140. Postprandial 140-160. She has a diabetic nutrition consult on  07-14-2019. She has made significant changes in her diet since being diagnosed with DM TYPE 2: Grills most of her meats, avoids excessive intake of high carb foods and sugary drinks.  Lab Results  Component Value Date   HGBA1C 12.1 (H) 06/09/2019  Blood pressure is well controlled. She has a blood pressure monitor at home and checks her blood pressure daily. Last reading 125/78. Denies chest pain, shortness of breath, palpitations, lightheadedness, dizziness, headaches or BLE edema. She does not take any antihypertensive medication. States she was taken off amlodipine 10 mg prior to hospital dc.  BP Readings from Last 3 Encounters:  06/14/19 120/77  10/10/14 113/74     Past Medical History:  Diagnosis Date  . Diabetes mellitus without complication ([place])   . Hypertension   . Low blood potassium     Past Surgical History:  Procedure Laterality Date  . BIOPSY  06/13/2019   Procedure: BIOPSY;  Surgeon: Jackquline Denmark, MD;  Location: [facility];  Service: Endoscopy;;  . CESAREAN SECTION     x 3  . ESOPHAGOGASTRODUODENOSCOPY (EGD) WITH PROPOFOL N/A 06/13/2019   Procedure: ESOPHAGOGASTRODUODENOSCOPY (EGD) WITH PROPOFOL;  Surgeon: Jackquline Denmark, MD;  Location: [facility];  Service: Endoscopy;  Laterality: N/A;  . HAND SURGERY Right   . MALONEY DILATION  06/13/2019  Procedure: MALONEY DILATION;  Surgeon: Jackquline Denmark, MD;  Location: [facility];  Service: Endoscopy;;    Family History  Problem Relation Age of Onset  . Diabetes Father     Social History   Socioeconomic History  . Marital status: Single    Spouse name: Not on  file  . Number of children: Not on file  . Years of education: Not on file  . Highest education level: Not on file  Occupational History  . Not on file  Social Needs  . Financial resource strain: Not on file  . Food insecurity    Worry: Not on file    Inability: Not on file  . Transportation needs    Medical: Not on file    Non-medical: Not on file  Tobacco Use  . Smoking status: Never Smoker  . Smokeless tobacco: Never Used  Substance and Sexual Activity  . Alcohol use: No  . Drug use: No  . Sexual activity: Not Currently  Lifestyle  . Physical activity    Days per week: Not on file    Minutes per session: Not on file  . Stress: Not on file  Relationships  . Social Herbalist on phone: Not on file    Gets together: Not on file    Attends religious service: Not on file    Active member of club or organization: Not on file    Attends meetings of clubs or organizations: Not on file    Relationship status: Not on file  Other Topics Concern  . Not on file  Social History Narrative  . Not on file     Observations/Objective: Awake, alert and oriented x 3   Review of Systems  Constitutional: Negative for fever, malaise/fatigue and weight loss.  HENT: Negative.  Negative for nosebleeds.   Eyes: Negative.  Negative for blurred vision, double vision and photophobia.  Respiratory: Negative.  Negative for cough and shortness of breath.   Cardiovascular: Negative.  Negative for chest pain, palpitations and leg swelling.  Gastrointestinal: Negative.  Negative for heartburn, nausea and vomiting.  Musculoskeletal: Negative.  Negative for myalgias.  Neurological: Negative.  Negative for dizziness, focal weakness, seizures and headaches.  Psychiatric/Behavioral: Negative.  Negative for suicidal ideas.    Assessment and Plan: Louise was evaluated today for hospitalization follow-up.  Diagnoses and all orders for this visit:  Poorly controlled type 2 diabetes mellitus  ([place]) -     Basic metabolic panel; Future -     CBC; Future -     Lipid panel; Future -     TSH; Future -     Microalbumin / creatinine urine ratio; Future Continue blood sugar control as discussed in office today, low carbohydrate diet, and regular physical exercise as tolerated, 150 minutes per week (30 min each day, 5 days per week, or 50 min 3 days per week). Keep blood sugar logs with fasting goal of 90-130 mg/dl, post prandial (after you eat) less than 180.  For Hypoglycemia: BS <60 and Hyperglycemia BS >400; contact the clinic ASAP. Annual eye exams and foot exams are recommended.   Diabetes is poorly controlled. Advised patient to keep a fasting blood sugar log fast, 2 hours post lunch and bedtime which will be reviewed at the next office visit.   Follow Up Instructions Return for meter check with luke 4 weeks. Then see me in 3 months.     I discussed the assessment and treatment plan with the patient. The  patient was provided an opportunity to ask questions and all were answered. The patient agreed with the plan and demonstrated an understanding of the instructions.   The patient was advised to call back or seek an in-person evaluation if the symptoms worsen or if the condition fails to improve as anticipated.  I provided 21 minutes of non-face-to-face time during this encounter including median intraservice time, reviewing previous notes, labs, imaging, medications and explaining diagnosis and management.  Gildardo Pounds, FNP-BC

## 2019-06-22 ENCOUNTER — Other Ambulatory Visit: Payer: Self-pay | Admitting: Hematology and Oncology

## 2019-06-22 ENCOUNTER — Encounter: Payer: Self-pay | Admitting: Gastroenterology

## 2019-06-22 DIAGNOSIS — D509 Iron deficiency anemia, unspecified: Secondary | ICD-10-CM | POA: Insufficient documentation

## 2019-06-22 DIAGNOSIS — D72829 Elevated white blood cell count, unspecified: Secondary | ICD-10-CM | POA: Insufficient documentation

## 2019-06-22 NOTE — Assessment & Plan Note (Signed)
With iron saturation 45% and ferritin greater than thousand, iron deficiency has been ruled out. Her anemia could be due to her anemia of chronic disease. She may have underlying beta thalassemia.. We can send for hemoglobin electrophoresis.

## 2019-06-22 NOTE — Assessment & Plan Note (Signed)
Primarily lymphocytosis Peripheral blood flow cytometry 06/13/2019: No monoclonal B-cell population identified.  Predominance of T cells with relative abundance of CD8 positive cells.  Nondiagnostic for any lymphoproliferative process.  Differential diagnosis: Secondary to current illness/infection Plan: Watchful monitoring.  No indications to treat.

## 2019-06-27 NOTE — Progress Notes (Signed)
[facility] NOTE  Patient Care Team: Gildardo Pounds, NP as PCP - General (Nurse Practitioner)  CHIEF COMPLAINTS/PURPOSE OF CONSULTATION:  Newly diagnosed leukocytosis  HISTORY OF PRESENTING ILLNESS:  Maria Robinson 63 y.o. female is here because of recent diagnosis of leukocytosis. She was hospitalized from 06/09/19-06/14/19 for diabetic ketoacidosis and was newly diagnosed with diabetes. WBC ranged between 13.1-18.8. CT abdomen showed no evidence of infection. She presents to the clinic today for initial consultation and evaluation.   I reviewed her records extensively and collaborated the history with the patient.  MEDICAL HISTORY:  Past Medical History:  Diagnosis Date  . Diabetes mellitus without complication ([place])   . Hypertension   . Low blood potassium     SURGICAL HISTORY: Past Surgical History:  Procedure Laterality Date  . BIOPSY  06/13/2019   Procedure: BIOPSY;  Surgeon: Jackquline Denmark, MD;  Location: [facility];  Service: Endoscopy;;  . CESAREAN SECTION     x 3  . ESOPHAGOGASTRODUODENOSCOPY (EGD) WITH PROPOFOL N/A 06/13/2019   Procedure: ESOPHAGOGASTRODUODENOSCOPY (EGD) WITH PROPOFOL;  Surgeon: Jackquline Denmark, MD;  Location: [facility];  Service: Endoscopy;  Laterality: N/A;  . HAND SURGERY Right   . MALONEY DILATION  06/13/2019   Procedure: Venia Minks DILATION;  Surgeon: Jackquline Denmark, MD;  Location: [facility];  Service: Endoscopy;;    SOCIAL HISTORY: Social History   Socioeconomic History  . Marital status: Single    Spouse name: Not on file  . Number of children: Not on file  . Years of education: Not on file  . Highest education level: Not on file  Occupational History  . Not on file  Social Needs  . Financial resource strain: Not on file  . Food insecurity    Worry: Not on file    Inability: Not on file  . Transportation needs    Medical: Not on file    Non-medical: Not on file  Tobacco Use  . Smoking status: Never  Smoker  . Smokeless tobacco: Never Used  Substance and Sexual Activity  . Alcohol use: No  . Drug use: No  . Sexual activity: Not Currently  Lifestyle  . Physical activity    Days per week: Not on file    Minutes per session: Not on file  . Stress: Not on file  Relationships  . Social Herbalist on phone: Not on file    Gets together: Not on file    Attends religious service: Not on file    Active member of club or organization: Not on file    Attends meetings of clubs or organizations: Not on file    Relationship status: Not on file  . Intimate partner violence    Fear of current or ex partner: Not on file    Emotionally abused: Not on file    Physically abused: Not on file    Forced sexual activity: Not on file  Other Topics Concern  . Not on file  Social History Narrative  . Not on file    FAMILY HISTORY: Family History  Problem Relation Age of Onset  . Diabetes Father     ALLERGIES:  is allergic to penicillins.  MEDICATIONS:  Current Outpatient Medications  Medication Sig Dispense Refill  . blood glucose meter kit and supplies Dispense based on patient and insurance preference. Use up to four times daily as directed. (FOR ICD-10 E10.9, E11.9). 1 each 0  . cetirizine (ZYRTEC) 5 MG tablet Take 5 mg  by mouth daily.    Marland Kitchen dicyclomine (BENTYL) 10 MG capsule Take 1 capsule (10 mg total) by mouth 3 (three) times daily between meals as needed for spasms. 90 capsule 0  . insulin glargine (LANTUS) 100 UNIT/ML injection Inject 0.08 mLs (8 Units total) into the skin daily. 10 mL 11  . pantoprazole (PROTONIX) 40 MG tablet Take 1 tablet (40 mg total) by mouth daily. 30 tablet 2  . polyethylene glycol powder (GLYCOLAX/MIRALAX) 17 GM/SCOOP powder Take 17 g by mouth 2 (two) times daily as needed for constipation.     No current facility-administered medications for this visit.     REVIEW OF SYSTEMS:   Constitutional: Denies fevers, chills or abnormal night sweats  Eyes: Denies blurriness of vision, double vision or watery eyes Ears, nose, mouth, throat, and face: Denies mucositis or sore throat Respiratory: Denies cough, dyspnea or wheezes Cardiovascular: Denies palpitation, chest discomfort or lower extremity swelling Gastrointestinal:  Denies nausea, heartburn or change in bowel habits Skin: Denies abnormal skin rashes Lymphatics: Denies new lymphadenopathy or easy bruising Neurological:Denies numbness, tingling or new weaknesses Behavioral/Psych: Mood is stable, no new changes  All other systems were reviewed with the patient and are negative.  PHYSICAL EXAMINATION: ECOG PERFORMANCE STATUS: 1 - Symptomatic but completely ambulatory  Vitals:   06/28/19 1435 06/28/19 1436  BP: (!) 189/99 (!) 189/85  Pulse: 89   Resp: 17   Temp: 98.5 F (36.9 C)   SpO2: 100%    Filed Weights   06/28/19 1435  Weight: 145 lb 12.8 oz (66.1 kg)    Physical exam not done due to COVID-19 precautions  LABORATORY DATA:  I have reviewed the data as listed Lab Results  Component Value Date   WBC 17.7 (H) 06/14/2019   HGB 10.5 (L) 06/14/2019   HCT 33.5 (L) 06/14/2019   MCV 77.4 (L) 06/14/2019   PLT 293 06/14/2019   Lab Results  Component Value Date   NA 142 06/14/2019   K 3.5 06/14/2019   CL 112 (H) 06/14/2019   CO2 21 (L) 06/14/2019    RADIOGRAPHIC STUDIES: I have personally reviewed the radiological reports and agreed with the findings in the report.  ASSESSMENT AND PLAN:  Leukocytosis Primarily lymphocytosis Peripheral blood flow cytometry 06/13/2019: No monoclonal B-cell population identified.  Predominance of T cells with relative abundance of CD8 positive cells.  Nondiagnostic for any lymphoproliferative process.  Differential diagnosis: Secondary to current illness/infection Lab review: WBC 14, improved from 17.7, hemoglobin 11.3 improved from 10.5, absolute lymphocyte count 9.1 improved from 15.7  Plan: Watchful monitoring.  No  indications to treat.   Microcytic anemia With iron saturation 45% and ferritin greater than thousand, iron deficiency has been ruled out. Her anemia could be due to her anemia of chronic disease. She may have underlying beta thalassemia.. Patient informed me that she had a electrophoresis testing before and that she has sickle cell trait. We will not do any further work-up regarding this.  All questions were answered. The patient knows to call the clinic with any problems, questions or concerns.   Rulon Eisenmenger, MD 06/28/2019    I, Molly Dorshimer, am acting as scribe for Nicholas Lose, MD.  I have reviewed the above documentation for accuracy and completeness, and I agree with the above.

## 2019-06-28 ENCOUNTER — Inpatient Hospital Stay: Payer: Self-pay | Attending: Hematology and Oncology | Admitting: Hematology and Oncology

## 2019-06-28 ENCOUNTER — Other Ambulatory Visit: Payer: Self-pay

## 2019-06-28 ENCOUNTER — Telehealth: Payer: Self-pay

## 2019-06-28 ENCOUNTER — Inpatient Hospital Stay: Payer: Self-pay

## 2019-06-28 DIAGNOSIS — Z794 Long term (current) use of insulin: Secondary | ICD-10-CM | POA: Insufficient documentation

## 2019-06-28 DIAGNOSIS — D509 Iron deficiency anemia, unspecified: Secondary | ICD-10-CM

## 2019-06-28 DIAGNOSIS — E119 Type 2 diabetes mellitus without complications: Secondary | ICD-10-CM | POA: Insufficient documentation

## 2019-06-28 DIAGNOSIS — K219 Gastro-esophageal reflux disease without esophagitis: Secondary | ICD-10-CM | POA: Insufficient documentation

## 2019-06-28 DIAGNOSIS — D72829 Elevated white blood cell count, unspecified: Secondary | ICD-10-CM | POA: Insufficient documentation

## 2019-06-28 LAB — CBC WITH DIFFERENTIAL (CANCER CENTER ONLY)
Abs Immature Granulocytes: 0.02 10*3/uL (ref 0.00–0.07)
Basophils Absolute: 0.1 10*3/uL (ref 0.0–0.1)
Basophils Relative: 1 %
Eosinophils Absolute: 0.2 10*3/uL (ref 0.0–0.5)
Eosinophils Relative: 1 %
HCT: 35.5 % — ABNORMAL LOW (ref 36.0–46.0)
Hemoglobin: 11.3 g/dL — ABNORMAL LOW (ref 12.0–15.0)
Immature Granulocytes: 0 %
Lymphocytes Relative: 65 %
Lymphs Abs: 9.1 10*3/uL — ABNORMAL HIGH (ref 0.7–4.0)
MCH: 24.6 pg — ABNORMAL LOW (ref 26.0–34.0)
MCHC: 31.8 g/dL (ref 30.0–36.0)
MCV: 77.2 fL — ABNORMAL LOW (ref 80.0–100.0)
Monocytes Absolute: 0.8 10*3/uL (ref 0.1–1.0)
Monocytes Relative: 6 %
Neutro Abs: 3.8 10*3/uL (ref 1.7–7.7)
Neutrophils Relative %: 27 %
Platelet Count: 371 10*3/uL (ref 150–400)
RBC: 4.6 MIL/uL (ref 3.87–5.11)
RDW: 15.9 % — ABNORMAL HIGH (ref 11.5–15.5)
WBC Count: 14 10*3/uL — ABNORMAL HIGH (ref 4.0–10.5)
nRBC: 0 % (ref 0.0–0.2)

## 2019-06-28 NOTE — Telephone Encounter (Signed)
Called and spoke with patient-Maria Robinson was informed the schedule for August is not available at this time; instructed patient to call back to the office in 3-4 weeks to schedule an appt with Dr. Lyndel Safe; recall also placed in Epic for appt to be made;

## 2019-06-28 NOTE — Telephone Encounter (Signed)
-----   Message from Timothy Lasso, RN sent at 06/15/2019 10:39 AM EDT ----- Regarding: FW: [facility] FOLLOW UP APPT  ----- Message ----- From: Alfredia Ferguson, PA-C Sent: 06/14/2019  11:12 AM EDT To: Greggory Keen, LPN Subject: We[facility] Rodney FOLLOW UP APPT                Beth- you can forward this to Dr Steve Rattler nurse - couldn't remember Bre's last name   Pt need follow up office visit with DR Lyndel Safe in 2 months  for epigastric pain, gastritis, gallstones , elevated LFT"s ?hep C  Please call pt tomorrow with appt - she lives in [place] , be sure sh knows F/U is in [place] ;)  thanks

## 2019-06-29 ENCOUNTER — Telehealth: Payer: Self-pay | Admitting: Hematology and Oncology

## 2019-06-29 LAB — HEMOGLOBINOPATHY EVALUATION
Hgb A2 Quant: 4.5 % — ABNORMAL HIGH (ref 1.8–3.2)
Hgb A: 69.8 % — ABNORMAL LOW (ref 96.4–98.8)
Hgb C: 0 %
Hgb F Quant: 0 % (ref 0.0–2.0)
Hgb S Quant: 25.7 % — ABNORMAL HIGH
Hgb Variant: 0 %

## 2019-06-29 LAB — HEPATITIS C GENOTYPE

## 2019-06-29 NOTE — Telephone Encounter (Signed)
I talk with patient regarding schedule  

## 2019-07-08 ENCOUNTER — Other Ambulatory Visit: Payer: Self-pay

## 2019-07-08 DIAGNOSIS — E119 Type 2 diabetes mellitus without complications: Secondary | ICD-10-CM

## 2019-07-08 MED ORDER — TRUE METRIX BLOOD GLUCOSE TEST VI STRP: ORAL_STRIP | 12 refills | Status: DC

## 2019-07-08 MED ORDER — TRUEPLUS LANCETS 28G MISC: 12 refills | Status: DC

## 2019-07-08 MED FILL — TRUEplus LANCETS 28G MISC: 25 days supply | Qty: 100 | Fill #0

## 2019-07-08 MED FILL — TRUE METRIX TEST STRIP: 25 days supply | Qty: 100 | Fill #0

## 2019-07-11 MED FILL — !LANTUS SOLOSTAR 100UNITS/M: 100 | 28 days supply | Qty: 10 | Fill #1

## 2019-07-14 ENCOUNTER — Ambulatory Visit: Payer: Self-pay | Admitting: *Deleted

## 2019-07-19 ENCOUNTER — Ambulatory Visit: Payer: Self-pay | Attending: Family Medicine | Admitting: Pharmacist

## 2019-07-19 ENCOUNTER — Other Ambulatory Visit: Payer: Self-pay

## 2019-07-19 DIAGNOSIS — E1165 Type 2 diabetes mellitus with hyperglycemia: Secondary | ICD-10-CM

## 2019-07-19 MED FILL — DICYCLOMINE 10 MG CAPSULE: 10 | 22 days supply | Qty: 90 | Fill #0

## 2019-07-19 NOTE — Progress Notes (Signed)
Meter check      S:    PCP: Sonja   No chief complaint on file.  Patient arrives in good spirits.  Presents for diabetes evaluation, education, and management Patient was referred and last seen by Primary Care Provider on 06/21/19.   Patient reports diabetes was diagnosed in June of this yr. PT was hospitalized from 6/18-6/23/20 for DKA. Discharged home with Lantus 8 units daily.   Family/Social History:  - DM (father) - Tobacco: never smoker  - Alcohol: denies use  Insurance coverage/medication affordability: self pay  Patient reports adherence with medications.  Current diabetes medications include: Lantus 8 units  Current hypertension medications include: none Current hyperlipidemia medications include: none  Patient denies hypoglycemic events.  Patient reported dietary habits:  - Reports eliminating carbs from diet - Denies intake of sweets, sweetened beverages; does drink diet Dr. Malachi Bonds or [employer] occasionally   Patient-reported exercise habits:  -30-45 minutes walking every morning   Patient denies nocturia.  Patient denies neuropathy. Patient denies visual changes. Patient reports self foot exams.    O:  Home fasting CBG: 136- 174 2 hour post-prandial/random CBG: 110 - 130.  Lab Results  Component Value Date   HGBA1C 12.1 (H) 06/09/2019   There were no vitals filed for this visit.  Lipid Panel     Component Value Date/Time   CHOL 293 (H) 07/19/2019 1041   TRIG 160 (H) 07/19/2019 1041   HDL 73 07/19/2019 1041   CHOLHDL 4.0 07/19/2019 1041   LDLCALC 188 (H) 07/19/2019 1041   Clinical ASCVD: No  The 10-year ASCVD risk score Mikey Bussing DC Jr., et al., 2013) is: 50.6%   Values used to calculate the score:     Age: 63 years     Sex: Female     Is Non-Hispanic African American: Yes     Diabetic: Yes     Tobacco smoker: No     Systolic Blood Pressure: 778 mmHg     Is BP treated: Yes     HDL Cholesterol: 73 mg/dL     Total Cholesterol: 293 mg/dL   A/P:  Diabetes longstanding currently uncontrolled but much improved since starting Lantus. Patient is able to verbalize appropriate hypoglycemia management plan. Patient is adherent with medication. Patient's fasting CBGs are above goal. Recommend she increase her Lantus and start taking before bedtime. At follow-up, if pt continues to have improvement, we can decide on changing from insulin to oral medication.  -Increase Lantus to 10 units. Take before bedtime.  -Extensively discussed pathophysiology of DM, recommended lifestyle interventions, dietary effects on glycemic control -Counseled on s/sx of and management of hypoglycemia -Next A1C anticipated 9/20.   ASCVD risk - primary prevention in patient with DM. Last LDL is not controlled. ASCVD risk score is >20%  - high intensity statin indicated.  -Continued atorvastatin 40 mg.   HM: Pt due for PNA and tetanus vaccines. Will address at f/u appt.   Written patient instructions provided. Total time in face to face counseling 30 minutes.   Follow up Pharmacist Clinic Visit in 3 weeks.   Benard Halsted, PharmD, [place] [phone]

## 2019-07-19 NOTE — Patient Instructions (Signed)
Thank you for coming to see me today. Please do the following:  1. Increase Lantus to 10 units and take before bedtime.  2. Continue checking blood sugars at home.  3. Continue making the lifestyle changes we've discussed together during our visit. Diet and exercise play a significant role in improving your blood sugars.  4. Follow-up with me in 1 month.   Hypoglycemia or low blood sugar:   Low blood sugar can happen quickly and may become an emergency if not treated right away.   While this shouldn't happen often, it can be brought upon if you skip a meal or do not eat enough. Also, if your insulin or other diabetes medications are dosed too high, this can cause your blood sugar to go to low.   Warning signs of low blood sugar include: 1. Feeling shaky or dizzy 2. Feeling weak or tired  3. Excessive hunger 4. Feeling anxious or upset  5. Sweating even when you aren't exercising  What to do if I experience low blood sugar? 1. Check your blood sugar with your meter. If lower than 70, proceed to step 2.  2. Treat with 3-4 glucose tablets or 3 packets of regular sugar. If these aren't around, you can try hard candy. Yet another option would be to drink 4 ounces of fruit juice or 6 ounces of REGULAR soda.  3. Re-check your sugar in 15 minutes. If it is still below 70, do what you did in step 2 again. If has come back up, go ahead and eat a snack or small meal at this time.

## 2019-07-20 ENCOUNTER — Other Ambulatory Visit: Payer: Self-pay | Admitting: Nurse Practitioner

## 2019-07-20 ENCOUNTER — Encounter: Payer: Self-pay | Admitting: Pharmacist

## 2019-07-20 MED ORDER — AMLODIPINE BESYLATE 10 MG PO TABS: 10.0000 mg | ORAL_TABLET | Freq: Every day | ORAL | 3 refills | Status: DC

## 2019-07-20 MED ORDER — ATORVASTATIN CALCIUM 40 MG PO TABS: 40.0000 mg | ORAL_TABLET | Freq: Every day | ORAL | 3 refills | Status: DC

## 2019-07-20 MED FILL — ATORVASTATIN CALCIUM 40 MG: 40 | 30 days supply | Qty: 30 | Fill #0

## 2019-07-20 MED FILL — AMLODIPINE BESYLATE 10 MG T: 10 | 30 days supply | Qty: 30 | Fill #0

## 2019-07-21 LAB — LIPID PANEL
Chol/HDL Ratio: 4 ratio (ref 0.0–4.4)
Cholesterol, Total: 293 mg/dL — ABNORMAL HIGH (ref 100–199)
HDL: 73 mg/dL (ref >39)
LDL Calculated: 188 mg/dL — ABNORMAL HIGH (ref 0–99)
Triglycerides: 160 mg/dL — ABNORMAL HIGH (ref 0–149)
VLDL Cholesterol Cal: 32 mg/dL (ref 5–40)

## 2019-07-21 LAB — BASIC METABOLIC PANEL
BUN/Creatinine Ratio: 23 (ref 12–28)
BUN: 19 mg/dL (ref 8–27)
CO2: 22 mmol/L (ref 20–29)
Calcium: 10.3 mg/dL (ref 8.7–10.3)
Chloride: 98 mmol/L (ref 96–106)
Creatinine, Ser: 0.84 mg/dL (ref 0.57–1.00)
GFR calc Af Amer: 86 mL/min/{1.73_m2} (ref >59)
GFR calc non Af Amer: 75 mL/min/{1.73_m2} (ref >59)
Glucose: 106 mg/dL — ABNORMAL HIGH (ref 65–99)
Potassium: 3.8 mmol/L (ref 3.5–5.2)
Sodium: 140 mmol/L (ref 134–144)

## 2019-07-21 LAB — CBC
Hematocrit: 40.2 % (ref 34.0–46.6)
Hemoglobin: 12.6 g/dL (ref 11.1–15.9)
MCH: 24.5 pg — ABNORMAL LOW (ref 26.6–33.0)
MCHC: 31.3 g/dL — ABNORMAL LOW (ref 31.5–35.7)
MCV: 78 fL — ABNORMAL LOW (ref 79–97)
Platelets: 446 10*3/uL (ref 150–450)
RBC: 5.15 x10E6/uL (ref 3.77–5.28)
RDW: 15.6 % — ABNORMAL HIGH (ref 11.7–15.4)
WBC: 11 10*3/uL — ABNORMAL HIGH (ref 3.4–10.8)

## 2019-07-21 LAB — MICROALBUMIN / CREATININE URINE RATIO
Creatinine, Urine: 27.6 mg/dL
Microalb/Creat Ratio: 40 mg/g creat — ABNORMAL HIGH (ref 0–29)
Microalbumin, Urine: 11 ug/mL

## 2019-07-21 LAB — TSH: TSH: 1.18 u[IU]/mL (ref 0.450–4.500)

## 2019-08-09 ENCOUNTER — Other Ambulatory Visit: Payer: Self-pay

## 2019-08-09 ENCOUNTER — Ambulatory Visit: Payer: Self-pay | Attending: Nurse Practitioner | Admitting: Pharmacist

## 2019-08-09 ENCOUNTER — Encounter: Payer: Self-pay | Admitting: Pharmacist

## 2019-08-09 DIAGNOSIS — E1165 Type 2 diabetes mellitus with hyperglycemia: Secondary | ICD-10-CM

## 2019-08-09 LAB — GLUCOSE, POCT (MANUAL RESULT ENTRY): POC Glucose: 100 mg/dl — AB (ref 70–99)

## 2019-08-09 MED FILL — TRUEPLUS PEN NDL 31G X 1/4: 31G X 6 MM | 25 days supply | Qty: 100 | Fill #0

## 2019-08-09 MED FILL — !LANTUS 100 UNITS/ML VIAL: 100 | 28 days supply | Qty: 10 | Fill #0

## 2019-08-09 NOTE — Patient Instructions (Signed)
Thank you for coming to see me today. Please do the following:  1. Continue Lantus 10 units daily.  2. Continue checking blood sugars at home. 3. Continue making the lifestyle changes we've discussed together during our visit. Diet and exercise play a significant role in improving your blood sugars.  4. Follow-up with your PCP next month.    Hypoglycemia or low blood sugar:   Low blood sugar can happen quickly and may become an emergency if not treated right away.   While this shouldn't happen often, it can be brought upon if you skip a meal or do not eat enough. Also, if your insulin or other diabetes medications are dosed too high, this can cause your blood sugar to go to low.   Warning signs of low blood sugar include: 1. Feeling shaky or dizzy 2. Feeling weak or tired  3. Excessive hunger 4. Feeling anxious or upset  5. Sweating even when you aren't exercising  What to do if I experience low blood sugar? 1. Check your blood sugar with your meter. If lower than 70, proceed to step 2.  2. Treat with 3-4 glucose tablets or 3 packets of regular sugar. If these aren't around, you can try hard candy. Yet another option would be to drink 4 ounces of fruit juice or 6 ounces of REGULAR soda.  3. Re-check your sugar in 15 minutes. If it is still below 70, do what you did in step 2 again. If has come back up, go ahead and eat a snack or small meal at this time.

## 2019-08-09 NOTE — Progress Notes (Addendum)
     S:    PCP: Cindee   No chief complaint on file.  Patient arrives in good spirits.  Presents for diabetes follow-up. Patient was referred and last seen by Primary Care Provider on 06/21/19. I saw her on 07/19/19.   Family/Social History:  - DM (father) - Tobacco: never smoker  - Alcohol: denies use  Insurance coverage/medication affordability: self pay  Patient reports adherence with medications.  Current diabetes medications include: Lantus 10 units  Current hypertension medications include: amlodipine 10 mg daily Current hyperlipidemia medications include: none  Patient denies hypoglycemic events.  Patient reported dietary habits:  - no changes  Patient-reported exercise habits:  - no changes    Patient denies  nocturia.  Patient denies  neuropathy. Patient denies  visual changes. Patient reports self foot exams.    O:  POCT glucose: 100 Home fasting CBG: 94 - 120. 2 hour post-prandial/random CBG: 90 - 133.  Lab Results  Component Value Date   HGBA1C 12.1 (H) 06/09/2019   There were no vitals filed for this visit.   Lipid Panel     Component Value Date/Time   CHOL 293 (H) 07/19/2019 1041   TRIG 160 (H) 07/19/2019 1041   HDL 73 07/19/2019 1041   CHOLHDL 4.0 07/19/2019 1041   LDLCALC 188 (H) 07/19/2019 1041   Clinical ASCVD: No  The 10-year ASCVD risk score Mikey Bussing DC Jr., et al., 2013) is: 50.6%   Values used to calculate the score:     Age: 9 years     Sex: Female     Is Non-Hispanic African American: Yes     Diabetic: Yes     Tobacco smoker: No     Systolic Blood Pressure: 518 mmHg     Is BP treated: Yes     HDL Cholesterol: 73 mg/dL     Total Cholesterol: 293 mg/dL   A/P: Diabetes longstanding currently uncontrolled with above goal A1c but home sugars are at goal. Patient is able to verbalize appropriate hypoglycemia management plan. Patient is adherent with medication.   Recommend to obtain updated A1c at f/u PCP appt. Patient is doing well  with glycemic control and her renal function is wnl. May consider deescalating therapy to oral metformin. Recommend updated liver function before initiating therapy. Of note, her microalbumin:SCr was >30; I recommend RAAS therapy with an ACE or ARB. Will send information to PCP.  -Continue Lantus 10 units daily at bedtime. -Extensively discussed pathophysiology of DM, recommended lifestyle interventions, dietary effects on glycemic control -Counseled on s/sx of and management of hypoglycemia -Next A1C anticipated 9/20.   ASCVD risk - primary prevention in patient with DM. Last LDL is not controlled. ASCVD risk score is >20%  - high intensity statin indicated.  -Continued atorvastatin 40 mg.   HM:  - Pneumovax given - Adacel given  Written patient instructions provided. Total time in face to face counseling 30 minutes.   Follow up Pharmacist Clinic Visit in 3 weeks.   Benard Halsted, PharmD, [place] [phone]

## 2019-08-10 ENCOUNTER — Other Ambulatory Visit: Payer: Self-pay | Admitting: Pharmacist

## 2019-08-10 MED ORDER — INSULIN GLARGINE 100 UNIT/ML ~~LOC~~ SOLN: 10.0000 [IU] | Freq: Every day | SUBCUTANEOUS | 2 refills | Status: DC

## 2019-08-10 MED ORDER — "INSULIN SYRINGE-NEEDLE U-100 31G X 5/16"" 0.3 ML MISC": 4 refills | Status: DC

## 2019-08-11 MED FILL — TRUEPLUS SYR 0.3ML 31GX5/16: 31G X 5/16" | 30 days supply | Qty: 100 | Fill #0

## 2019-08-18 MED FILL — ATORVASTATIN CALCIUM 40 MG: 40 | 30 days supply | Qty: 30 | Fill #1

## 2019-09-09 ENCOUNTER — Encounter: Payer: Self-pay | Admitting: Nurse Practitioner

## 2019-09-09 ENCOUNTER — Ambulatory Visit (HOSPITAL_BASED_OUTPATIENT_CLINIC_OR_DEPARTMENT_OTHER): Payer: BLUE CROSS/BLUE SHIELD | Admitting: Pharmacist

## 2019-09-09 ENCOUNTER — Other Ambulatory Visit: Payer: Self-pay

## 2019-09-09 ENCOUNTER — Ambulatory Visit: Payer: BLUE CROSS/BLUE SHIELD | Attending: Nurse Practitioner | Admitting: Nurse Practitioner

## 2019-09-09 VITALS — BP 120/78 | HR 89 | Ht <= 58 in | Wt 149.2 lb

## 2019-09-09 DIAGNOSIS — E119 Type 2 diabetes mellitus without complications: Secondary | ICD-10-CM

## 2019-09-09 DIAGNOSIS — I10 Essential (primary) hypertension: Secondary | ICD-10-CM | POA: Diagnosis not present

## 2019-09-09 DIAGNOSIS — E1165 Type 2 diabetes mellitus with hyperglycemia: Secondary | ICD-10-CM | POA: Diagnosis not present

## 2019-09-09 DIAGNOSIS — E782 Mixed hyperlipidemia: Secondary | ICD-10-CM

## 2019-09-09 DIAGNOSIS — Z23 Encounter for immunization: Secondary | ICD-10-CM | POA: Diagnosis not present

## 2019-09-09 DIAGNOSIS — Z1211 Encounter for screening for malignant neoplasm of colon: Secondary | ICD-10-CM | POA: Diagnosis not present

## 2019-09-09 DIAGNOSIS — Z1231 Encounter for screening mammogram for malignant neoplasm of breast: Secondary | ICD-10-CM

## 2019-09-09 LAB — POCT GLYCOSYLATED HEMOGLOBIN (HGB A1C): Hemoglobin A1C: 6.5 % — AB (ref 4.0–5.6)

## 2019-09-09 LAB — GLUCOSE, POCT (MANUAL RESULT ENTRY): POC Glucose: 120 mg/dl — AB (ref 70–99)

## 2019-09-09 MED ORDER — CLONIDINE HCL 0.1 MG PO TABS
0.2000 mg | ORAL_TABLET | Freq: Once | ORAL | Status: AC
  Administered 2019-09-09: 11:00:00 0.2 mg via ORAL

## 2019-09-09 MED ORDER — LISINOPRIL 5 MG PO TABS: 2.5000 mg | ORAL_TABLET | Freq: Every day | ORAL | 3 refills | Status: DC

## 2019-09-09 MED ORDER — TRUE METRIX BLOOD GLUCOSE TEST VI STRP: ORAL_STRIP | 12 refills | Status: DC

## 2019-09-09 MED ORDER — LOSARTAN POTASSIUM-HCTZ 50-12.5 MG PO TABS: 1.0000 | ORAL_TABLET | Freq: Every day | ORAL | 3 refills | Status: DC

## 2019-09-09 MED FILL — TRUE METRIX TEST STRIP: 25 days supply | Qty: 100 | Fill #0

## 2019-09-09 MED FILL — LOSARTAN-HCTZ 50-12.5 MG TA: 50-12.5 | 30 days supply | Qty: 30 | Fill #0

## 2019-09-09 MED FILL — LISINOPRIL 5 MG TABLET: 5 | 30 days supply | Qty: 15 | Fill #0

## 2019-09-09 NOTE — Progress Notes (Signed)
Patient presents for vaccination against influenza per orders of Jennalyn. Consent given. Counseling provided. No contraindications exists. Vaccine administered without incident.   

## 2019-09-09 NOTE — Progress Notes (Signed)
Assessment & Plan:  Maria Robinson was seen today for follow-up.  Diagnoses and all orders for this visit:  Controlled type 2 diabetes mellitus without complication, without long-term current use of insulin (HCC) -     Glucose (CBG) -     HgB A1c -     Cancel: Ambulatory referral to Ophthalmology -     CMP14+EGFR -     glucose blood (TRUE METRIX BLOOD GLUCOSE TEST) test strip; Use as instructed once daily Continue blood sugar control as discussed in office today, low carbohydrate diet, and regular physical exercise as tolerated, 150 minutes per week (30 min each day, 5 days per week, or 50 min 3 days per week). Keep blood sugar logs with fasting goal of 90-130 mg/dl, post prandial (after you eat) less than 180.  For Hypoglycemia: BS <60 and Hyperglycemia BS >400; contact the clinic ASAP. Annual eye exams and foot exams are recommended.   Essential hypertension -     cloNIDine (CATAPRES) tablet 0.2 mg -     CMP14+EGFR -     lisinopril (ZESTRIL) 5 MG tablet; Take 0.5 tablets (2.5 mg total) by mouth daily. For kidney protection with diabetes Continue all antihypertensives as prescribed.  Remember to bring in your blood pressure log with you for your follow up appointment.  DASH/Mediterranean Diets are healthier choices for HTN.    Colon cancer screening -     Ambulatory referral to Gastroenterology  Mixed hyperlipidemia INSTRUCTIONS: Work on a low fat, heart healthy diet and participate in regular aerobic exercise program by working out at least 150 minutes per week; 5 days a week-30 minutes per day. Avoid red meat, fried foods. junk foods, sodas, sugary drinks, unhealthy snacking, alcohol and smoking.  Drink at least 48oz of water per day and monitor your carbohydrate intake daily.    Breast cancer screening by mammogram -     MM 3D SCREEN BREAST BILATERAL; Future      Patient has been counseled on age-appropriate routine health concerns for screening and prevention. These are  reviewed and up-to-date. Referrals have been placed accordingly. Immunizations are up-to-date or declined.    Subjective:   Chief Complaint  Patient presents with   Follow-up    Pt. is here for diabetes follow up.    HPI Maria Robinson 63 y.o. female presents to office today for follow up to DM and HTN.   Essential Hypertension Currently taking amlodipine 72m daily. Her blood pressure was significantly elevated today. She required 0.229mof clonidine today in the office. Will follow up with Blood pressure at next office visit for PAP. Denies chest pain, shortness of breath, palpitations, lightheadedness, dizziness, headaches or BLE edema.  BP Readings from Last 3 Encounters:  09/09/19 120/78  06/28/19 (!) 189/85  06/14/19 120/77    DM TYPE 2 Well controlled. Will start low dose ACE today. She is already taking Statin. Will check liver function and if WNL she will be started on metformin and taken off Lantus 10 units. She denies any hypo glycemic symptoms. UTD for eye exam.  Lab Results  Component Value Date   HGBA1C 6.5 (A) 09/09/2019   Lab Results  Component Value Date   HGBA1C 12.1 (H) 06/09/2019    DYSLIPIDEMIA LDL not at goal. She was started on atorvastatin 40 mg in July. Will repeat lipid panel at next office visit. She denies any statin intolerance.  Lab Results  Component Value Date   LDLCALC 188 (H) 07/19/2019   ROS  Past  Medical History:  Diagnosis Date   Diabetes mellitus without complication ([place])    Hypertension    Low blood potassium     Past Surgical History:  Procedure Laterality Date   BIOPSY  06/13/2019   Procedure: BIOPSY;  Surgeon: Jackquline Denmark, MD;  Location: [facility];  Service: Endoscopy;;   CESAREAN SECTION     x 3   ESOPHAGOGASTRODUODENOSCOPY (EGD) WITH PROPOFOL N/A 06/13/2019   Procedure: ESOPHAGOGASTRODUODENOSCOPY (EGD) WITH PROPOFOL;  Surgeon: Jackquline Denmark, MD;  Location: [facility];  Service: Endoscopy;  Laterality: N/A;     HAND SURGERY Right    MALONEY DILATION  06/13/2019   Procedure: MALONEY DILATION;  Surgeon: Jackquline Denmark, MD;  Location: [facility];  Service: Endoscopy;;    Family History  Problem Relation Age of Onset   Diabetes Father     Social History Reviewed with no changes to be made today.   Outpatient Medications Prior to Visit  Medication Sig Dispense Refill   amLODipine (NORVASC) 10 MG tablet Take 1 tablet (10 mg total) by mouth daily. 90 tablet 3   atorvastatin (LIPITOR) 40 MG tablet Take 1 tablet (40 mg total) by mouth daily. 90 tablet 3   cetirizine (ZYRTEC) 5 MG tablet Take 5 mg by mouth daily.     pantoprazole (PROTONIX) 40 MG tablet Take 1 tablet (40 mg total) by mouth daily. 30 tablet 2   polyethylene glycol powder (GLYCOLAX/MIRALAX) 17 GM/SCOOP powder Take 17 g by mouth 2 (two) times daily as needed for constipation.     TRUEplus Lancets 28G MISC Use as instructed once daily 100 each 12   glucose blood (TRUE METRIX BLOOD GLUCOSE TEST) test strip Use as instructed once daily 100 each 12   insulin glargine (LANTUS) 100 UNIT/ML injection Inject 0.1 mLs (10 Units total) into the skin daily. 10 mL 2   Insulin Syringe-Needle U-100 (TRUEPLUS INSULIN SYRINGE) 31G X 5/16" 0.3 ML MISC Use daily to inject insulin. 100 each 4   dicyclomine (BENTYL) 10 MG capsule Take 1 capsule (10 mg total) by mouth 3 (three) times daily between meals as needed for spasms. (Patient not taking: Reported on 09/09/2019) 90 capsule 0   No facility-administered medications prior to visit.     Allergies  Allergen Reactions   Penicillins Swelling and Rash    Did it involve swelling of the face/tongue/throat, SOB, or low BP? y Did it involve sudden or severe rash/hives, skin peeling, or any reaction on the inside of your mouth or nose? y Did you need to seek medical attention at a hospital or doctor's office? y When did it last happen? 2026 If all above answers are NO, may proceed with  cephalosporin use.       Objective:    BP 120/78 (BP Location: Left Arm, Patient Position: Sitting, Cuff Size: Normal)    Pulse 89    Ht '4\' 10"'  (1.473 m)    Wt 149 lb 3.2 oz (67.7 kg)    SpO2 100%    BMI 31.18 kg/m  Wt Readings from Last 3 Encounters:  09/09/19 149 lb 3.2 oz (67.7 kg)  06/28/19 145 lb 12.8 oz (66.1 kg)  06/13/19 190 lb (86.2 kg)    Physical Exam Vitals signs and nursing note reviewed.  Constitutional:      Appearance: She is well-developed.  HENT:     Head: Normocephalic and atraumatic.  Neck:     Musculoskeletal: Normal range of motion.  Cardiovascular:     Rate and Rhythm: Normal  rate and regular rhythm.     Heart sounds: Normal heart sounds. No murmur. No friction rub. No gallop.   Pulmonary:     Effort: Pulmonary effort is normal. No tachypnea or respiratory distress.     Breath sounds: Normal breath sounds. No decreased breath sounds, wheezing, rhonchi or rales.  Chest:     Chest wall: No tenderness.  Abdominal:     General: Bowel sounds are normal.     Palpations: Abdomen is soft.  Musculoskeletal: Normal range of motion.  Skin:    General: Skin is warm and dry.  Neurological:     Mental Status: She is alert and oriented to person, place, and time.     Coordination: Coordination normal.  Psychiatric:        Behavior: Behavior normal. Behavior is cooperative.        Thought Content: Thought content normal.        Judgment: Judgment normal.          Patient has been counseled extensively about nutrition and exercise as well as the importance of adherence with medications and regular follow-up. The patient was given clear instructions to go to ER or return to medical center if symptoms don't improve, worsen or new problems develop. The patient verbalized understanding.   Follow-up: Return for  PAP SMEAR.   Gildardo Pounds, FNP-BC [facility] and [place], [place]   09/09/2019, 7:44 PM

## 2019-09-10 ENCOUNTER — Other Ambulatory Visit: Payer: Self-pay | Admitting: Nurse Practitioner

## 2019-09-10 LAB — CMP14+EGFR
ALT: 206 IU/L — ABNORMAL HIGH (ref 0–32)
AST: 105 IU/L — ABNORMAL HIGH (ref 0–40)
Albumin/Globulin Ratio: 1.7 (ref 1.2–2.2)
Albumin: 4.7 g/dL (ref 3.8–4.8)
Alkaline Phosphatase: 125 IU/L — ABNORMAL HIGH (ref 39–117)
BUN/Creatinine Ratio: 22 (ref 12–28)
BUN: 17 mg/dL (ref 8–27)
Bilirubin Total: 0.3 mg/dL (ref 0.0–1.2)
CO2: 20 mmol/L (ref 20–29)
Calcium: 9.3 mg/dL (ref 8.7–10.3)
Chloride: 103 mmol/L (ref 96–106)
Creatinine, Ser: 0.78 mg/dL (ref 0.57–1.00)
GFR calc Af Amer: 94 mL/min/{1.73_m2} (ref >59)
GFR calc non Af Amer: 82 mL/min/{1.73_m2} (ref >59)
Globulin, Total: 2.8 g/dL (ref 1.5–4.5)
Glucose: 97 mg/dL (ref 65–99)
Potassium: 4.6 mmol/L (ref 3.5–5.2)
Sodium: 140 mmol/L (ref 134–144)
Total Protein: 7.5 g/dL (ref 6.0–8.5)

## 2019-09-10 MED ORDER — METFORMIN HCL 500 MG PO TABS: 500.0000 mg | ORAL_TABLET | Freq: Two times a day (BID) | ORAL | 1 refills | Status: DC

## 2019-09-12 MED FILL — metFORMIN HCL 500 MG TABS: 500 | 30 days supply | Qty: 60 | Fill #0

## 2019-09-16 MED FILL — ATORVASTATIN CALCIUM 40 MG: 40 | 30 days supply | Qty: 30 | Fill #2

## 2019-10-05 MED FILL — LISINOPRIL 5 MG TABLET: 5 | 30 days supply | Qty: 15 | Fill #1

## 2019-10-05 MED FILL — LOSARTAN-HCTZ 50-12.5 MG TA: 50-12.5 | 30 days supply | Qty: 30 | Fill #1

## 2019-10-07 ENCOUNTER — Other Ambulatory Visit: Payer: BLUE CROSS/BLUE SHIELD | Admitting: Nurse Practitioner

## 2019-10-13 MED FILL — metFORMIN HCL 500 MG TABS: 500 | 30 days supply | Qty: 60 | Fill #1

## 2019-10-13 MED FILL — ATORVASTATIN CALCIUM 40 MG: 40 | 30 days supply | Qty: 30 | Fill #3

## 2019-11-03 MED FILL — TRUE METRIX TEST STRIP: 25 days supply | Qty: 100 | Fill #1

## 2019-11-03 MED FILL — LISINOPRIL 5 MG TABLET: 5 | 30 days supply | Qty: 15 | Fill #2

## 2019-11-09 MED FILL — LOSARTAN-HCTZ 50-12.5 MG TA: 50-12.5 | 30 days supply | Qty: 30 | Fill #2

## 2019-11-11 MED FILL — metFORMIN HCL 500 MG TABS: 500 | 30 days supply | Qty: 60 | Fill #2

## 2019-11-11 MED FILL — ATORVASTATIN CALCIUM 40 MG: 40 | 30 days supply | Qty: 30 | Fill #4

## 2019-11-25 ENCOUNTER — Ambulatory Visit: Payer: BLUE CROSS/BLUE SHIELD | Attending: Nurse Practitioner | Admitting: Nurse Practitioner

## 2019-11-25 ENCOUNTER — Other Ambulatory Visit: Payer: Self-pay

## 2019-11-25 ENCOUNTER — Encounter: Payer: Self-pay | Admitting: Nurse Practitioner

## 2019-11-25 VITALS — BP 140/81 | HR 100 | Temp 98.9°F | Ht <= 58 in | Wt 148.6 lb

## 2019-11-25 DIAGNOSIS — D72829 Elevated white blood cell count, unspecified: Secondary | ICD-10-CM

## 2019-11-25 DIAGNOSIS — R7989 Other specified abnormal findings of blood chemistry: Secondary | ICD-10-CM

## 2019-11-25 DIAGNOSIS — E782 Mixed hyperlipidemia: Secondary | ICD-10-CM

## 2019-11-25 DIAGNOSIS — Z124 Encounter for screening for malignant neoplasm of cervix: Secondary | ICD-10-CM

## 2019-11-25 DIAGNOSIS — E119 Type 2 diabetes mellitus without complications: Secondary | ICD-10-CM

## 2019-11-25 DIAGNOSIS — E1165 Type 2 diabetes mellitus with hyperglycemia: Secondary | ICD-10-CM

## 2019-11-25 LAB — GLUCOSE, POCT (MANUAL RESULT ENTRY): POC Glucose: 92 mg/dl (ref 70–99)

## 2019-11-25 NOTE — Progress Notes (Signed)
Assessment & Plan:  Maria Robinson was seen today for gynecologic exam.  Diagnoses and all orders for this visit:  Encounter for Papanicolaou smear for cervical cancer screening -     PAP WITH EVERYTHING -     WET PREP  Type 2 diabetes mellitus with hyperglycemia, without long-term current use of insulin (HCC) -     Glucose (CBG) -     A1c Continue blood sugar control as discussed in office today, low carbohydrate diet, and regular physical exercise as tolerated, 150 minutes per week (30 min each day, 5 days per week, or 50 min 3 days per week). Keep blood sugar logs with fasting goal of 90-130 mg/dl, post prandial (after you eat) less than 180.  For Hypoglycemia: BS <60 and Hyperglycemia BS >400; contact the clinic ASAP. Annual eye exams and foot exams are recommended.   Elevated LFTs -     CMP14+EGFR  Leukocytosis, unspecified type -     CBC with Differential  Mixed hyperlipidemia -     Lipid Panel INSTRUCTIONS: Work on a low fat, heart healthy diet and participate in regular aerobic exercise program by working out at least 150 minutes per week; 5 days a week-30 minutes per day. Avoid red meat/beef/steak,  fried foods. junk foods, sodas, sugary drinks, unhealthy snacking, alcohol and smoking.  Drink at least 80 oz of water per day and monitor your carbohydrate intake daily.    Patient has been counseled on age-appropriate routine health concerns for screening and prevention. These are reviewed and up-to-date. Referrals have been placed accordingly. Immunizations are up-to-date or declined.    Subjective:   Chief Complaint  Patient presents with  . Gynecologic Exam    Pt. is here for pap smear.    HPI Maria Robinson 63 y.o. female presents to office today for PAP smear. Blood pressure is slightly elevated however she endorses medication compliance taking amlodipine 10 mg daily and lisinopril 2.5 mg daily. Denies chest pain, shortness of breath, palpitations, lightheadedness,  dizziness, headaches or BLE edema.     Review of Systems  Constitutional: Negative.  Negative for chills, fever, malaise/fatigue and weight loss.  Respiratory: Negative.  Negative for cough, shortness of breath and wheezing.   Cardiovascular: Negative.  Negative for chest pain, orthopnea and leg swelling.  Gastrointestinal: Negative for abdominal pain.  Genitourinary: Negative.  Negative for flank pain.  Skin: Negative.  Negative for rash.  Psychiatric/Behavioral: Negative for suicidal ideas.    Past Medical History:  Diagnosis Date  . Diabetes mellitus without complication ([place])   . Hypertension   . Low blood potassium     Past Surgical History:  Procedure Laterality Date  . BIOPSY  06/13/2019   Procedure: BIOPSY;  Surgeon: Jackquline Denmark, MD;  Location: [facility];  Service: Endoscopy;;  . CESAREAN SECTION     x 3  . ESOPHAGOGASTRODUODENOSCOPY (EGD) WITH PROPOFOL N/A 06/13/2019   Procedure: ESOPHAGOGASTRODUODENOSCOPY (EGD) WITH PROPOFOL;  Surgeon: Jackquline Denmark, MD;  Location: [facility];  Service: Endoscopy;  Laterality: N/A;  . HAND SURGERY Right   . MALONEY DILATION  06/13/2019   Procedure: Venia Minks DILATION;  Surgeon: Jackquline Denmark, MD;  Location: [facility];  Service: Endoscopy;;    Family History  Problem Relation Age of Onset  . Diabetes Father     Social History Reviewed with no changes to be made today.   Outpatient Medications Prior to Visit  Medication Sig Dispense Refill  . amLODipine (NORVASC) 10 MG tablet Take 1 tablet (10 mg  total) by mouth daily. 90 tablet 3  . atorvastatin (LIPITOR) 40 MG tablet Take 1 tablet (40 mg total) by mouth daily. 90 tablet 3  . cetirizine (ZYRTEC) 5 MG tablet Take 5 mg by mouth daily.    Marland Kitchen glucose blood (TRUE METRIX BLOOD GLUCOSE TEST) test strip Use as instructed once daily 100 each 12  . lisinopril (ZESTRIL) 5 MG tablet Take 0.5 tablets (2.5 mg total) by mouth daily. For kidney protection with diabetes 90 tablet 3  .  metFORMIN (GLUCOPHAGE) 500 MG tablet Take 1 tablet (500 mg total) by mouth 2 (two) times daily with a meal. 180 tablet 1  . pantoprazole (PROTONIX) 40 MG tablet Take 1 tablet (40 mg total) by mouth daily. 30 tablet 2  . polyethylene glycol powder (GLYCOLAX/MIRALAX) 17 GM/SCOOP powder Take 17 g by mouth 2 (two) times daily as needed for constipation.    . TRUEplus Lancets 28G MISC Use as instructed once daily 100 each 12  . dicyclomine (BENTYL) 10 MG capsule Take 1 capsule (10 mg total) by mouth 3 (three) times daily between meals as needed for spasms. (Patient not taking: Reported on 09/09/2019) 90 capsule 0   No facility-administered medications prior to visit.     Allergies  Allergen Reactions  . Penicillins Swelling and Rash    Did it involve swelling of the face/tongue/throat, SOB, or low BP? y Did it involve sudden or severe rash/hives, skin peeling, or any reaction on the inside of your mouth or nose? y Did you need to seek medical attention at a hospital or doctor's office? y When did it last happen? 2026 If all above answers are "NO", may proceed with cephalosporin use.       Objective:    BP 140/81 (BP Location: Right Arm, Patient Position: Sitting, Cuff Size: Normal)   Pulse 100   Temp 98.9 F (37.2 C) (Oral)   Ht '4\' 10"'  (1.473 m)   Wt 148 lb 9.6 oz (67.4 kg)   SpO2 97%   BMI 31.06 kg/m  Wt Readings from Last 3 Encounters:  11/25/19 148 lb 9.6 oz (67.4 kg)  09/09/19 149 lb 3.2 oz (67.7 kg)  06/28/19 145 lb 12.8 oz (66.1 kg)    Physical Exam Exam conducted with a chaperone present.  Constitutional:      Appearance: She is well-developed.  HENT:     Head: Normocephalic.  Cardiovascular:     Rate and Rhythm: Normal rate and regular rhythm.     Heart sounds: Normal heart sounds.  Pulmonary:     Effort: Pulmonary effort is normal.     Breath sounds: Normal breath sounds.  Abdominal:     General: Bowel sounds are normal.     Palpations: Abdomen is soft.      Hernia: There is no hernia in the left inguinal area.  Genitourinary:    Labia:        Right: No rash, tenderness, lesion or injury.        Left: No rash, tenderness, lesion or injury.      Vagina: Normal. No signs of injury and foreign body. No vaginal discharge, erythema, tenderness or bleeding.     Cervix: No cervical motion tenderness or friability.     Uterus: Not deviated and not enlarged.      Adnexa:        Right: No mass, tenderness or fullness.         Left: No mass, tenderness or fullness.  Rectum: Normal. No external hemorrhoid.  Lymphadenopathy:     Lower Body: No right inguinal adenopathy. No left inguinal adenopathy.  Skin:    General: Skin is warm and dry.  Neurological:     Mental Status: She is alert and oriented to person, place, and time.  Psychiatric:        Behavior: Behavior normal.        Thought Content: Thought content normal.        Judgment: Judgment normal.          Patient has been counseled extensively about nutrition and exercise as well as the importance of adherence with medications and regular follow-up. The patient was given clear instructions to go to ER or return to medical center if symptoms don't improve, worsen or new problems develop. The patient verbalized understanding.   Follow-up: Return in about 3 months (around 02/23/2020).   Gildardo Pounds, FNP-BC [facility] and [place], [place]   11/25/2019, 11:44 AM

## 2019-11-26 LAB — CMP14+EGFR
ALT: 227 IU/L — ABNORMAL HIGH (ref 0–32)
AST: 103 IU/L — ABNORMAL HIGH (ref 0–40)
Albumin/Globulin Ratio: 1.6 (ref 1.2–2.2)
Albumin: 4.9 g/dL — ABNORMAL HIGH (ref 3.8–4.8)
Alkaline Phosphatase: 100 IU/L (ref 39–117)
BUN/Creatinine Ratio: 27 (ref 12–28)
BUN: 24 mg/dL (ref 8–27)
Bilirubin Total: 0.7 mg/dL (ref 0.0–1.2)
CO2: 19 mmol/L — ABNORMAL LOW (ref 20–29)
Calcium: 10.1 mg/dL (ref 8.7–10.3)
Chloride: 101 mmol/L (ref 96–106)
Creatinine, Ser: 0.88 mg/dL (ref 0.57–1.00)
GFR calc Af Amer: 81 mL/min/{1.73_m2} (ref >59)
GFR calc non Af Amer: 71 mL/min/{1.73_m2} (ref >59)
Globulin, Total: 3.1 g/dL (ref 1.5–4.5)
Glucose: 88 mg/dL (ref 65–99)
Potassium: 4.3 mmol/L (ref 3.5–5.2)
Sodium: 139 mmol/L (ref 134–144)
Total Protein: 8 g/dL (ref 6.0–8.5)

## 2019-11-26 LAB — CBC WITH DIFFERENTIAL/PLATELET
Basophils Absolute: 0.1 10*3/uL (ref 0.0–0.2)
Basos: 1 %
EOS (ABSOLUTE): 0.1 10*3/uL (ref 0.0–0.4)
Eos: 1 %
Hematocrit: 38.7 % (ref 34.0–46.6)
Hemoglobin: 12.1 g/dL (ref 11.1–15.9)
Immature Grans (Abs): 0 10*3/uL (ref 0.0–0.1)
Immature Granulocytes: 0 %
Lymphocytes Absolute: 6.7 10*3/uL — ABNORMAL HIGH (ref 0.7–3.1)
Lymphs: 47 %
MCH: 23 pg — ABNORMAL LOW (ref 26.6–33.0)
MCHC: 31.3 g/dL — ABNORMAL LOW (ref 31.5–35.7)
MCV: 73 fL — ABNORMAL LOW (ref 79–97)
Monocytes Absolute: 1.1 10*3/uL — ABNORMAL HIGH (ref 0.1–0.9)
Monocytes: 8 %
Neutrophils Absolute: 6.1 10*3/uL (ref 1.4–7.0)
Neutrophils: 43 %
Platelets: 354 10*3/uL (ref 150–450)
RBC: 5.27 x10E6/uL (ref 3.77–5.28)
RDW: 16.3 % — ABNORMAL HIGH (ref 11.7–15.4)
WBC: 14.2 10*3/uL — ABNORMAL HIGH (ref 3.4–10.8)

## 2019-11-26 LAB — LIPID PANEL
Chol/HDL Ratio: 2.3 ratio (ref 0.0–4.4)
Cholesterol, Total: 183 mg/dL (ref 100–199)
HDL: 81 mg/dL (ref >39)
LDL Chol Calc (NIH): 78 mg/dL (ref 0–99)
Triglycerides: 140 mg/dL (ref 0–149)
VLDL Cholesterol Cal: 24 mg/dL (ref 5–40)

## 2019-11-26 LAB — HEMOGLOBIN A1C
Est. average glucose Bld gHb Est-mCnc: 146 mg/dL
Hgb A1c MFr Bld: 6.7 % — ABNORMAL HIGH (ref 4.8–5.6)

## 2019-11-28 LAB — CERVICOVAGINAL ANCILLARY ONLY
Candida Glabrata: NEGATIVE
Candida Vaginitis: NEGATIVE
Chlamydia: NEGATIVE
Comment: NEGATIVE
Comment: NEGATIVE
Comment: NEGATIVE
Comment: NEGATIVE
Comment: NORMAL
Neisseria Gonorrhea: NEGATIVE
Trichomonas: NEGATIVE

## 2019-11-29 LAB — CYTOLOGY - PAP
Comment: NEGATIVE
Comment: NEGATIVE
Comment: NEGATIVE
Diagnosis: NEGATIVE
HPV 16: NEGATIVE
HPV 18 / 45: NEGATIVE
High risk HPV: POSITIVE — AB

## 2019-12-05 MED FILL — LISINOPRIL 5 MG TABLET: 5 | 30 days supply | Qty: 15 | Fill #3

## 2019-12-06 ENCOUNTER — Telehealth: Payer: Self-pay

## 2019-12-06 NOTE — Telephone Encounter (Signed)
Will route to PCP 

## 2019-12-06 NOTE — Telephone Encounter (Signed)
Patient states that she is having congestion and she thinks that its her sinus. Patient states that she is having swelling around her eyes.  Patient is requesting medication be sent to [facility].

## 2019-12-07 ENCOUNTER — Other Ambulatory Visit: Payer: Self-pay | Admitting: Nurse Practitioner

## 2019-12-07 MED ORDER — FLUTICASONE PROPIONATE 50 MCG/ACT NA SUSP: 2.0000 | Freq: Every day | NASAL | 6 refills | Status: DC

## 2019-12-07 MED ORDER — CETIRIZINE HCL 10 MG PO TABS: 10.0000 mg | ORAL_TABLET | Freq: Every day | ORAL | 0 refills | Status: DC

## 2019-12-07 NOTE — Telephone Encounter (Signed)
Meds have been sent.

## 2019-12-08 MED FILL — FLUTICASONE PROP 50 MCG SPR: 50 | 30 days supply | Qty: 16 | Fill #0

## 2019-12-08 NOTE — Telephone Encounter (Signed)
Spoke to patient and inform medication has been sent.  Pt. Stated she's been using nasal spray and it does not help.

## 2019-12-11 NOTE — Telephone Encounter (Signed)
Would like for her to try the antihistamine and humidifier for congestion.

## 2019-12-14 NOTE — Telephone Encounter (Signed)
Attempt to reach patient to inform on PCP advising. No answer and Left a VM.

## 2019-12-19 ENCOUNTER — Other Ambulatory Visit: Payer: Self-pay

## 2019-12-19 ENCOUNTER — Ambulatory Visit: Payer: BLUE CROSS/BLUE SHIELD | Attending: Nurse Practitioner

## 2019-12-19 DIAGNOSIS — R7989 Other specified abnormal findings of blood chemistry: Secondary | ICD-10-CM

## 2019-12-20 ENCOUNTER — Telehealth: Payer: Self-pay | Admitting: Hematology and Oncology

## 2019-12-20 LAB — HEPATIC FUNCTION PANEL
ALT: 62 IU/L — ABNORMAL HIGH (ref 0–32)
AST: 46 IU/L — ABNORMAL HIGH (ref 0–40)
Albumin: 3.9 g/dL (ref 3.8–4.8)
Alkaline Phosphatase: 75 IU/L (ref 39–117)
Bilirubin Total: 0.4 mg/dL (ref 0.0–1.2)
Bilirubin, Direct: 0.07 mg/dL (ref 0.00–0.40)
Total Protein: 7.7 g/dL (ref 6.0–8.5)

## 2019-12-20 NOTE — Telephone Encounter (Signed)
Rescheduled per MD, Called and left VM. Mailed printout

## 2019-12-26 ENCOUNTER — Other Ambulatory Visit: Payer: Self-pay | Admitting: Nurse Practitioner

## 2019-12-26 MED ORDER — GLIMEPIRIDE 2 MG PO TABS: 2.0000 mg | ORAL_TABLET | Freq: Every day | ORAL | 3 refills | Status: DC

## 2019-12-26 MED FILL — GLIMEPIRIDE 2 MG TABS: 2 | 30 days supply | Qty: 30 | Fill #0

## 2019-12-29 ENCOUNTER — Ambulatory Visit: Payer: Self-pay | Admitting: Hematology and Oncology

## 2019-12-29 ENCOUNTER — Other Ambulatory Visit: Payer: Self-pay

## 2020-01-03 ENCOUNTER — Other Ambulatory Visit: Payer: Self-pay | Admitting: *Deleted

## 2020-01-03 NOTE — Progress Notes (Signed)
Patient Care Team: Gildardo Pounds, NP as PCP - General (Nurse Practitioner)  DIAGNOSIS:    ICD-10-CM   1. Leukocytosis, unspecified type  D72.829   2. Microcytic anemia  D50.9     CHIEF COMPLIANT: Follow-up of leukocytosis and microcytic anemia  INTERVAL HISTORY: Maria Robinson is a 64 y.o. with above-mentioned history of leukocytosis and microcytic anemia. She presents to the clinic today for follow-up.  Today her blood pressure is extremely elevated.  She however denies any headaches or lightheadedness or blurred vision. She has had no recent infections or illnesses. She tells me that she has been exercising 200 miles every day and watching her diet and because of that she is now coming off insulin as well as Metformin.  ALLERGIES:  is allergic to penicillins.  MEDICATIONS:  Current Outpatient Medications  Medication Sig Dispense Refill  . amLODipine (NORVASC) 10 MG tablet Take 1 tablet (10 mg total) by mouth daily. 90 tablet 3  . atorvastatin (LIPITOR) 40 MG tablet Take 1 tablet (40 mg total) by mouth daily. 90 tablet 3  . cetirizine (ZYRTEC) 10 MG tablet Take 1 tablet (10 mg total) by mouth daily. 90 tablet 0  . dicyclomine (BENTYL) 10 MG capsule Take 1 capsule (10 mg total) by mouth 3 (three) times daily between meals as needed for spasms. (Patient not taking: Reported on 09/09/2019) 90 capsule 0  . fluticasone (FLONASE) 50 MCG/ACT nasal spray Place 2 sprays into both nostrils daily. 16 g 6  . glimepiride (AMARYL) 2 MG tablet Take 1 tablet (2 mg total) by mouth daily before breakfast. 30 tablet 3  . glucose blood (TRUE METRIX BLOOD GLUCOSE TEST) test strip Use as instructed once daily 100 each 12  . lisinopril (ZESTRIL) 5 MG tablet Take 0.5 tablets (2.5 mg total) by mouth daily. For kidney protection with diabetes 90 tablet 3  . metFORMIN (GLUCOPHAGE) 500 MG tablet Take 1 tablet (500 mg total) by mouth 2 (two) times daily with a meal. 180 tablet 1  . pantoprazole (PROTONIX)  40 MG tablet Take 1 tablet (40 mg total) by mouth daily. 30 tablet 2  . polyethylene glycol powder (GLYCOLAX/MIRALAX) 17 GM/SCOOP powder Take 17 g by mouth 2 (two) times daily as needed for constipation.    . TRUEplus Lancets 28G MISC Use as instructed once daily 100 each 12   No current facility-administered medications for this visit.    PHYSICAL EXAMINATION: ECOG PERFORMANCE STATUS: 1 - Symptomatic but completely ambulatory  There were no vitals filed for this visit. There were no vitals filed for this visit.   LABORATORY DATA:  I have reviewed the data as listed CMP Latest Ref Rng & Units 12/19/2019 11/25/2019 09/09/2019  Glucose 65 - 99 mg/dL - 88 97  BUN 8 - 27 mg/dL - 24 17  Creatinine 0.57 - 1.00 mg/dL - 0.88 0.78  Sodium 134 - 144 mmol/L - 139 140  Potassium 3.5 - 5.2 mmol/L - 4.3 4.6  Chloride 96 - 106 mmol/L - 101 103  CO2 20 - 29 mmol/L - 19(L) 20  Calcium 8.7 - 10.3 mg/dL - 10.1 9.3  Total Protein 6.0 - 8.5 g/dL 7.7 8.0 7.5  Total Bilirubin 0.0 - 1.2 mg/dL 0.4 0.7 0.3  Alkaline Phos 39 - 117 IU/L 75 100 125(H)  AST 0 - 40 IU/L 46(H) 103(H) 105(H)  ALT 0 - 32 IU/L 62(H) 227(H) 206(H)    Lab Results  Component Value Date   WBC 9.4 01/04/2020  HGB 12.5 01/04/2020   HCT 39.0 01/04/2020   MCV 73.3 (L) 01/04/2020   PLT 329 01/04/2020   NEUTROABS 3.2 01/04/2020    ASSESSMENT & PLAN:  Leukocytosis Primarily lymphocytosis Peripheral blood flow cytometry 06/13/2019: No monoclonal B-cell population identified.  Predominance of T cells with relative abundance of CD8 positive cells.  Nondiagnostic for any lymphoproliferative process.  Differential diagnosis: Secondary to current illness/infection Lab review: WBC count is normal today Current treatment: Watchful monitoring I believe that her healthy lifestyle with exercising and eating better has enabled the white blood cell count to come down because there is decreased body inflammation.  Microcytic anemia With a  prior history of sickle cell trait. There is no current evidence of anemia. She may also have beta thalassemia.  She does feel that she has had a previous electrophoresis and therefore we did not repeat it.  Even if she was diagnosed with beta thalassemia we would not be doing anything different  Based on normal results, she can be seen on an as-needed basis.   No orders of the defined types were placed in this encounter.  The patient has a good understanding of the overall plan. she agrees with it. she will call with any problems that may develop before the next visit here.  Total time spent: 15 mins including face to face time and time spent for planning, charting and coordination of care  Nicholas Lose, MD 01/04/2020  I, Cloyde Reams Dorshimer, am acting as scribe for Dr. Nicholas Lose.  I have reviewed the above documentation for accuracy and completeness, and I agree with the above.

## 2020-01-04 ENCOUNTER — Inpatient Hospital Stay: Payer: 59 | Attending: Hematology and Oncology

## 2020-01-04 ENCOUNTER — Inpatient Hospital Stay (HOSPITAL_BASED_OUTPATIENT_CLINIC_OR_DEPARTMENT_OTHER): Payer: 59 | Admitting: Hematology and Oncology

## 2020-01-04 ENCOUNTER — Other Ambulatory Visit: Payer: Self-pay

## 2020-01-04 DIAGNOSIS — D509 Iron deficiency anemia, unspecified: Secondary | ICD-10-CM | POA: Insufficient documentation

## 2020-01-04 DIAGNOSIS — D72829 Elevated white blood cell count, unspecified: Secondary | ICD-10-CM

## 2020-01-04 DIAGNOSIS — Z880 Allergy status to penicillin: Secondary | ICD-10-CM | POA: Diagnosis not present

## 2020-01-04 DIAGNOSIS — D72820 Lymphocytosis (symptomatic): Secondary | ICD-10-CM | POA: Insufficient documentation

## 2020-01-04 DIAGNOSIS — Z79899 Other long term (current) drug therapy: Secondary | ICD-10-CM | POA: Diagnosis not present

## 2020-01-04 LAB — CBC WITH DIFFERENTIAL (CANCER CENTER ONLY)
Abs Immature Granulocytes: 0.02 10*3/uL (ref 0.00–0.07)
Basophils Absolute: 0.1 10*3/uL (ref 0.0–0.1)
Basophils Relative: 1 %
Eosinophils Absolute: 0.2 10*3/uL (ref 0.0–0.5)
Eosinophils Relative: 2 %
HCT: 39 % (ref 36.0–46.0)
Hemoglobin: 12.5 g/dL (ref 12.0–15.0)
Immature Granulocytes: 0 %
Lymphocytes Relative: 52 %
Lymphs Abs: 5 10*3/uL — ABNORMAL HIGH (ref 0.7–4.0)
MCH: 23.5 pg — ABNORMAL LOW (ref 26.0–34.0)
MCHC: 32.1 g/dL (ref 30.0–36.0)
MCV: 73.3 fL — ABNORMAL LOW (ref 80.0–100.0)
Monocytes Absolute: 0.9 10*3/uL (ref 0.1–1.0)
Monocytes Relative: 10 %
Neutro Abs: 3.2 10*3/uL (ref 1.7–7.7)
Neutrophils Relative %: 35 %
Platelet Count: 329 10*3/uL (ref 150–400)
RBC: 5.32 MIL/uL — ABNORMAL HIGH (ref 3.87–5.11)
RDW: 15 % (ref 11.5–15.5)
WBC Count: 9.4 10*3/uL (ref 4.0–10.5)
nRBC: 0 % (ref 0.0–0.2)

## 2020-01-04 NOTE — Assessment & Plan Note (Signed)
With a prior history of sickle cell trait. Anemia could be related to anemia of chronic disease/inflammation She may also have beta thalassemia.  She does feel that she has had a previous electrophoresis and therefore we did not repeat it.  Even if she was diagnosed with beta thalassemia we would not be doing anything different

## 2020-01-04 NOTE — Assessment & Plan Note (Signed)
Primarily lymphocytosis Peripheral blood flow cytometry 06/13/2019: No monoclonal B-cell population identified.  Predominance of T cells with relative abundance of CD8 positive cells.  Nondiagnostic for any lymphoproliferative process.  Differential diagnosis: Secondary to current illness/infection Lab review:  Current treatment: Watchful monitoring

## 2020-01-12 ENCOUNTER — Ambulatory Visit: Payer: 59 | Attending: Family | Admitting: Family

## 2020-01-12 ENCOUNTER — Encounter: Payer: Self-pay | Admitting: Family

## 2020-01-12 ENCOUNTER — Other Ambulatory Visit: Payer: Self-pay

## 2020-01-12 VITALS — BP 172/94 | HR 113 | Ht <= 58 in | Wt 145.0 lb

## 2020-01-12 DIAGNOSIS — R252 Cramp and spasm: Secondary | ICD-10-CM

## 2020-01-12 DIAGNOSIS — M79604 Pain in right leg: Secondary | ICD-10-CM | POA: Diagnosis not present

## 2020-01-12 NOTE — Patient Instructions (Addendum)
Continue Tylenol. Take multivitamin daily. Follow up with NP, Raul Del 02/27/2020 (appointment already scheduled) or sooner if symptoms worsen. Leg Cramps Leg cramps occur when one or more muscles tighten and you have no control over this tightening (involuntary muscle contraction). Muscle cramps can develop in any muscle, but the most common place is in the calf muscles of the leg. Those cramps can occur during exercise or when you are at rest. Leg cramps are painful, and they may last for a few seconds to a few minutes. Cramps may return several times before they finally stop. Usually, leg cramps are not caused by a serious medical problem. In many cases, the cause is not known. Some common causes include:  Excessive physical effort (overexertion), such as during intense exercise.  Overuse from repetitive motions, or doing the same thing over and over.  Staying in a certain position for a long period of time.  Improper preparation, form, or technique while performing a sport or an activity.  Dehydration.  Injury.  Side effects of certain medicines.  Abnormally low levels of minerals in your blood (electrolytes), especially potassium and calcium. This could result from: ? Pregnancy. ? Taking diuretic medicines. Follow these instructions at home: Eating and drinking  Drink enough fluid to keep your urine pale yellow. Staying hydrated may help prevent cramps.  Eat a healthy diet that includes plenty of nutrients to help your muscles function. A healthy diet includes fruits and vegetables, lean protein, whole grains, and low-fat or nonfat dairy products. Managing pain, stiffness, and swelling      Try massaging, stretching, and relaxing the affected muscle. Do this for several minutes at a time.  If directed, put ice on areas that are sore or painful after a cramp: ? Put ice in a plastic bag. ? Place a towel between your skin and the bag. ? Leave the ice on for 20 minutes, 2-3 times  a day.  If directed, apply heat to muscles that are tense or tight. Do this before you exercise, or as often as told by your health care provider. Use the heat source that your health care provider recommends, such as a moist heat pack or a heating pad. ? Place a towel between your skin and the heat source. ? Leave the heat on for 20-30 minutes. ? Remove the heat if your skin turns bright red. This is especially important if you are unable to feel pain, heat, or cold. You may have a greater risk of getting burned.  Try taking hot showers or baths to help relax tight muscles. General instructions  If you are having frequent leg cramps, avoid intense exercise for several days.  Take over-the-counter and prescription medicines only as told by your health care provider.  Keep all follow-up visits as told by your health care provider. This is important. Contact a health care provider if:  Your leg cramps get more severe or more frequent, or they do not improve over time.  Your foot becomes cold, numb, or blue. Summary  Muscle cramps can develop in any muscle, but the most common place is in the calf muscles of the leg.  Leg cramps are painful, and they may last for a few seconds to a few minutes.  Usually, leg cramps are not caused by a serious medical problem. Often, the cause is not known.  Stay hydrated and take over-the-counter and prescription medicines only as told by your health care provider. This information is not intended to replace advice given  to you by your health care provider. Make sure you discuss any questions you have with your health care provider. Document Revised: 11/20/2017 Document Reviewed: 09/17/2017 Elsevier Patient Education  2020 [employer].

## 2020-01-12 NOTE — Progress Notes (Signed)
Patient is having cramps in legs.  The pain switches from side to side.

## 2020-01-12 NOTE — Progress Notes (Signed)
Patient ID: Maria Robinson, female    DOB: 02/11/1956  MRN: [id number]  CC: Right Leg Cramps   Subjective: Maria Robinson is a 64 y.o. female who presents for concerns of right leg cramps.  1. RIGHT LEG CRAMPS: Started 2 weeks ago on the back side of right thigh radiating to right buttocks. Pain described as sharp and 10/10. Reports back side of right thigh sore as well. Soreness lasts all day. Began using Tylenol 1 week ago which has relieved right thigh cramps, pain, and soreness. Reports was  shaking when standing 2 weeks ago related to right leg cramps, pain, and soreness. Had to hold on to furniture for ambulation. Denies shaking of legs since 2 weeks ago. Reports when she is resting in bed the front part of right thigh muscle would jump causing right foot to kick up towards the air. Denies muscle jumping since beginning Tylenol. Heating pad made better. Walking makes it worse. Reports walk for exercise daily 1 mile used to walk 2 miles before trouble with right leg. Has been able to sleep. Taking extra-strength 2 capsules in morning, evening, and at night. Denies bowel and bladder incontinence. Denies chest pain. Denies shortness of breath. Denies fever. Admits chills 1 week ago. Denies cough.   Patient Active Problem List   Diagnosis Date Noted  . Leukocytosis 06/22/2019  . Microcytic anemia 06/22/2019  . Esophageal dysphagia   . Elevated LFTs   . Lower abdominal pain   . DKA (diabetic ketoacidoses) ([place]) 06/09/2019  . DM (diabetes mellitus) ([place]) 06/09/2019  . AKI (acute kidney injury) ([place]) 06/09/2019  . Elevated liver enzymes 06/09/2019  . Hyponatremia 06/09/2019  . Muscle weakness (generalized) 06/09/2019     Current Outpatient Medications on File Prior to Visit  Medication Sig Dispense Refill  . amLODipine (NORVASC) 10 MG tablet Take 1 tablet (10 mg total) by mouth daily. 90 tablet 3  . atorvastatin (LIPITOR) 40 MG tablet Take 1 tablet (40 mg total) by mouth daily. 90  tablet 3  . cetirizine (ZYRTEC) 10 MG tablet Take 1 tablet (10 mg total) by mouth daily. 90 tablet 0  . fluticasone (FLONASE) 50 MCG/ACT nasal spray Place 2 sprays into both nostrils daily. 16 g 6  . glimepiride (AMARYL) 2 MG tablet Take 1 tablet (2 mg total) by mouth daily before breakfast. 30 tablet 3  . glucose blood (TRUE METRIX BLOOD GLUCOSE TEST) test strip Use as instructed once daily 100 each 12  . pantoprazole (PROTONIX) 40 MG tablet Take 1 tablet (40 mg total) by mouth daily. 30 tablet 2  . polyethylene glycol powder (GLYCOLAX/MIRALAX) 17 GM/SCOOP powder Take 17 g by mouth 2 (two) times daily as needed for constipation.    . TRUEplus Lancets 28G MISC Use as instructed once daily 100 each 12  . dicyclomine (BENTYL) 10 MG capsule Take 1 capsule (10 mg total) by mouth 3 (three) times daily between meals as needed for spasms. (Patient not taking: Reported on 09/09/2019) 90 capsule 0  . lisinopril (ZESTRIL) 5 MG tablet Take 0.5 tablets (2.5 mg total) by mouth daily. For kidney protection with diabetes 90 tablet 3   No current facility-administered medications on file prior to visit.    Allergies  Allergen Reactions  . Penicillins Swelling and Rash    Did it involve swelling of the face/tongue/throat, SOB, or low BP? y Did it involve sudden or severe rash/hives, skin peeling, or any reaction on the inside of your mouth or nose? y Did  you need to seek medical attention at a hospital or doctor's office? y When did it last happen? 2026 If all above answers are "NO", may proceed with cephalosporin use.    Social History   Socioeconomic History  . Marital status: Single    Spouse name: Not on file  . Number of children: Not on file  . Years of education: Not on file  . Highest education level: Not on file  Occupational History  . Not on file  Tobacco Use  . Smoking status: Never Smoker  . Smokeless tobacco: Never Used  Substance and Sexual Activity  . Alcohol use: No  .  Drug use: No  . Sexual activity: Not Currently  Other Topics Concern  . Not on file  Social History Narrative  . Not on file   Social Determinants of Health   Financial Resource Strain:   . Difficulty of Paying Living Expenses: Not on file  Food Insecurity:   . Worried About Charity fundraiser in the Last Year: Not on file  . Ran Out of Food in the Last Year: Not on file  Transportation Needs:   . Lack of Transportation (Medical): Not on file  . Lack of Transportation (Non-Medical): Not on file  Physical Activity:   . Days of Exercise per Week: Not on file  . Minutes of Exercise per Session: Not on file  Stress:   . Feeling of Stress : Not on file  Social Connections:   . Frequency of Communication with Friends and Family: Not on file  . Frequency of Social Gatherings with Friends and Family: Not on file  . Attends Religious Services: Not on file  . Active Member of Clubs or Organizations: Not on file  . Attends Archivist Meetings: Not on file  . Marital Status: Not on file  Intimate Partner Violence:   . Fear of Current or Ex-Partner: Not on file  . Emotionally Abused: Not on file  . Physically Abused: Not on file  . Sexually Abused: Not on file    Family History  Problem Relation Age of Onset  . Diabetes Father     Past Surgical History:  Procedure Laterality Date  . BIOPSY  06/13/2019   Procedure: BIOPSY;  Surgeon: Jackquline Denmark, MD;  Location: [facility];  Service: Endoscopy;;  . CESAREAN SECTION     x 3  . ESOPHAGOGASTRODUODENOSCOPY (EGD) WITH PROPOFOL N/A 06/13/2019   Procedure: ESOPHAGOGASTRODUODENOSCOPY (EGD) WITH PROPOFOL;  Surgeon: Jackquline Denmark, MD;  Location: [facility];  Service: Endoscopy;  Laterality: N/A;  . HAND SURGERY Right   . MALONEY DILATION  06/13/2019   Procedure: Venia Minks DILATION;  Surgeon: Jackquline Denmark, MD;  Location: [facility];  Service: Endoscopy;;    ROS: Review of Systems  Constitutional: Negative for chills and  fever.  Respiratory: Negative for cough, sputum production, shortness of breath and wheezing.   Cardiovascular: Negative for chest pain, palpitations and orthopnea.  Musculoskeletal: Positive for myalgias. Negative for falls.  Neurological: Negative for dizziness, tingling, tremors, sensory change, speech change and headaches.  Negative except as stated above  PHYSICAL EXAM: BP (!) 172/94   Pulse (!) 113   Ht 4\' 10"  (1.473 m)   Wt 145 lb (65.8 kg)   SpO2 96%   BMI 30.31 kg/m   Physical Exam General appearance - alert, well appearing, and in no distress and oriented to person, place, and time Mental status - alert, oriented to person, place, and time, normal mood, behavior,  speech, dress, motor activity, and thought processes Chest - clear to auscultation, no wheezes, rales or rhonchi, symmetric air entry, no tachypnea, retractions or cyanosis Heart - normal rate, regular rhythm, normal S1, S2, no murmurs, rubs, clicks or gallops, normal rate and regular rhythm, S1 and S2 normal, no murmurs noted, no gallops noted Neurological - alert, oriented, normal speech, no focal findings or movement disorder noted, neck supple without rigidity, cranial nerves II through XII intact, DTR's normal and symmetric Musculoskeletal - no joint tenderness, deformity or swelling, full range of motion without pain, tenderness right posterior thigh  CMP Latest Ref Rng & Units 12/19/2019 11/25/2019 09/09/2019  Glucose 65 - 99 mg/dL - 88 97  BUN 8 - 27 mg/dL - 24 17  Creatinine 0.57 - 1.00 mg/dL - 0.88 0.78  Sodium 134 - 144 mmol/L - 139 140  Potassium 3.5 - 5.2 mmol/L - 4.3 4.6  Chloride 96 - 106 mmol/L - 101 103  CO2 20 - 29 mmol/L - 19(L) 20  Calcium 8.7 - 10.3 mg/dL - 10.1 9.3  Total Protein 6.0 - 8.5 g/dL 7.7 8.0 7.5  Total Bilirubin 0.0 - 1.2 mg/dL 0.4 0.7 0.3  Alkaline Phos 39 - 117 IU/L 75 100 125(H)  AST 0 - 40 IU/L 46(H) 103(H) 105(H)  ALT 0 - 32 IU/L 62(H) 227(H) 206(H)   Lipid Panel       Component Value Date/Time   CHOL 183 11/25/2019 1134   TRIG 140 11/25/2019 1134   HDL 81 11/25/2019 1134   CHOLHDL 2.3 11/25/2019 1134   LDLCALC 78 11/25/2019 1134    CBC    Component Value Date/Time   WBC 9.4 01/04/2020 0748   WBC 17.7 (H) 06/14/2019 0807   RBC 5.32 (H) 01/04/2020 0748   HGB 12.5 01/04/2020 0748   HGB 12.1 11/25/2019 1134   HCT 39.0 01/04/2020 0748   HCT 38.7 11/25/2019 1134   PLT 329 01/04/2020 0748   PLT 354 11/25/2019 1134   MCV 73.3 (L) 01/04/2020 0748   MCV 73 (L) 11/25/2019 1134   MCH 23.5 (L) 01/04/2020 0748   MCHC 32.1 01/04/2020 0748   RDW 15.0 01/04/2020 0748   RDW 16.3 (H) 11/25/2019 1134   LYMPHSABS 5.0 (H) 01/04/2020 0748   LYMPHSABS 6.7 (H) 11/25/2019 1134   MONOABS 0.9 01/04/2020 0748   EOSABS 0.2 01/04/2020 0748   EOSABS 0.1 11/25/2019 1134   BASOSABS 0.1 01/04/2020 0748   BASOSABS 0.1 11/25/2019 1134    ASSESSMENT AND PLAN: 1. RIGHT POSTERIOR THIGH LEG CRAMPS:  -Continue taking over-the-counter Tylenol for management of leg pain, cramps, and soreness. -Take multivitamin over-the-counter daily -Use heating pad or cold compress as needed for management of leg pain, cramps, and soreness -Follow-up with NP, Raul Del if symptoms worsen and or become severe -Keep next appointment with NP, Raul Del scheduled for March 2021 -Patient was given clear instructions to go to Emergency Department or return to medical center if symptoms don't improve, worsen, or new problems develop.The patient verbalized understanding.  Patient was given the opportunity to ask questions.  Patient verbalized understanding of the plan and was able to repeat key elements of the plan.    Requested Prescriptions    No prescriptions requested or ordered in this encounter    Lev Cervone Zachery Dauer, NP

## 2020-01-19 MED FILL — LISINOPRIL 5 MG TABLET: 5 | 30 days supply | Qty: 15 | Fill #4

## 2020-01-27 MED FILL — GLIMEPIRIDE 2 MG TABS: 2 | 90 days supply | Qty: 90 | Fill #1

## 2020-02-21 MED FILL — LISINOPRIL 5 MG TABLET: 5 | 30 days supply | Qty: 15 | Fill #5

## 2020-02-21 MED FILL — FLUTICASONE PROP 50 MCG SPR: 50 | 30 days supply | Qty: 16 | Fill #1

## 2020-02-27 ENCOUNTER — Encounter: Payer: Self-pay | Admitting: Gastroenterology

## 2020-02-27 ENCOUNTER — Ambulatory Visit: Payer: 59 | Attending: Nurse Practitioner | Admitting: Nurse Practitioner

## 2020-02-27 ENCOUNTER — Encounter: Payer: Self-pay | Admitting: Nurse Practitioner

## 2020-02-27 ENCOUNTER — Other Ambulatory Visit: Payer: Self-pay

## 2020-02-27 DIAGNOSIS — M79671 Pain in right foot: Secondary | ICD-10-CM | POA: Diagnosis not present

## 2020-02-27 DIAGNOSIS — R748 Abnormal levels of other serum enzymes: Secondary | ICD-10-CM

## 2020-02-27 DIAGNOSIS — K219 Gastro-esophageal reflux disease without esophagitis: Secondary | ICD-10-CM

## 2020-02-27 DIAGNOSIS — E1165 Type 2 diabetes mellitus with hyperglycemia: Secondary | ICD-10-CM

## 2020-02-27 DIAGNOSIS — I10 Essential (primary) hypertension: Secondary | ICD-10-CM

## 2020-02-27 DIAGNOSIS — M79672 Pain in left foot: Secondary | ICD-10-CM

## 2020-02-27 DIAGNOSIS — Z1211 Encounter for screening for malignant neoplasm of colon: Secondary | ICD-10-CM

## 2020-02-27 DIAGNOSIS — E785 Hyperlipidemia, unspecified: Secondary | ICD-10-CM

## 2020-02-27 NOTE — Progress Notes (Signed)
Virtual Visit via Telephone Note Due to national recommendations of social distancing due to [place] 19, telehealth visit is felt to be most appropriate for this patient at this time.  I discussed the limitations, risks, security and privacy concerns of performing an evaluation and management service by telephone and the availability of in person appointments. I also discussed with the patient that there may be a patient responsible charge related to this service. The patient expressed understanding and agreed to proceed.    I connected with Maria Robinson on 02/27/20  at  10:10 AM EST  EDT by telephone and verified that I am speaking with the correct person using two identifiers.   Consent I discussed the limitations, risks, security and privacy concerns of performing an evaluation and management service by telephone and the availability of in person appointments. I also discussed with the patient that there may be a patient responsible charge related to this service. The patient expressed understanding and agreed to proceed.   Location of Patient: Private Residence   Location of Provider: [place] participating in Telemedicine visit: Nyla Creason FNP-BC [place]    History of Present Illness: Telemedicine visit for: Follow Up   DM TYPE 2 Monitoring her blood glucose levels twice per day with averages: 120-130s. Denies any symptoms of hypo or hyperglycemia. Taking glimepiride 2 mg daily as prescribed. Currently up-to-date on eye exam. Denies any hypo or hyperglycemic symptoms. Taking renal dose ACE and STATIN.   Lab Results  Component Value Date   HGBA1C 5.8 (H) 02/29/2020    Bilateral Foot Pain Worse on the left side of her foot. Aggravating factors: Walking. Relieving factors: decreased pressure   Essential Hypertension Not well controlled. I have increased lisinopril to 5 mg from 2.5 and she is also taking  amlodipine 10 mg daily. Denies chest pain, shortness of breath, palpitations, lightheadedness, dizziness, headaches or BLE edema.  BP Readings from Last 3 Encounters:  01/12/20 (!) 172/94  01/04/20 (!) 202/102  11/25/19 140/81    Past Medical History:  Diagnosis Date  . Diabetes mellitus without complication ([place])   . Hypertension   . Low blood potassium     Past Surgical History:  Procedure Laterality Date  . BIOPSY  06/13/2019   Procedure: BIOPSY;  Surgeon: Jackquline Denmark, MD;  Location: [facility];  Service: Endoscopy;;  . CESAREAN SECTION     x 3  . ESOPHAGOGASTRODUODENOSCOPY (EGD) WITH PROPOFOL N/A 06/13/2019   Procedure: ESOPHAGOGASTRODUODENOSCOPY (EGD) WITH PROPOFOL;  Surgeon: Jackquline Denmark, MD;  Location: [facility];  Service: Endoscopy;  Laterality: N/A;  . HAND SURGERY Right   . MALONEY DILATION  06/13/2019   Procedure: Venia Minks DILATION;  Surgeon: Jackquline Denmark, MD;  Location: [facility];  Service: Endoscopy;;    Family History  Problem Relation Age of Onset  . Diabetes Father     Social History   Socioeconomic History  . Marital status: Single    Spouse name: Not on file  . Number of children: Not on file  . Years of education: Not on file  . Highest education level: Not on file  Occupational History  . Not on file  Tobacco Use  . Smoking status: Never Smoker  . Smokeless tobacco: Never Used  Substance and Sexual Activity  . Alcohol use: No  . Drug use: No  . Sexual activity: Not Currently  Other Topics Concern  . Not on file  Social History Narrative  .  Not on file   Social Determinants of Health   Financial Resource Strain:   . Difficulty of Paying Living Expenses:   Food Insecurity:   . Worried About Charity fundraiser in the Last Year:   . Arboriculturist in the Last Year:   Transportation Needs:   . Film/video editor (Medical):   Marland Kitchen Lack of Transportation (Non-Medical):   Physical Activity:   . Days of Exercise per Week:   .  Minutes of Exercise per Session:   Stress:   . Feeling of Stress :   Social Connections:   . Frequency of Communication with Friends and Family:   . Frequency of Social Gatherings with Friends and Family:   . Attends Religious Services:   . Active Member of Clubs or Organizations:   . Attends Archivist Meetings:   Marland Kitchen Marital Status:      Observations/Objective: Awake, alert and oriented x 3   Review of Systems  Constitutional: Negative for fever, malaise/fatigue and weight loss.  HENT: Negative.  Negative for nosebleeds.   Eyes: Negative.  Negative for blurred vision, double vision and photophobia.  Respiratory: Negative.  Negative for cough and shortness of breath.   Cardiovascular: Negative.  Negative for chest pain, palpitations and leg swelling.  Gastrointestinal: Negative.  Negative for heartburn, nausea and vomiting.  Musculoskeletal: Negative for myalgias.       FOOT PAIN; SEE HPI  Neurological: Negative.  Negative for dizziness, focal weakness, seizures and headaches.  Psychiatric/Behavioral: Negative.  Negative for suicidal ideas.    Assessment and Plan: [name] was seen today for follow-up.  Diagnoses and all orders for this visit:  Type 2 diabetes mellitus with hyperglycemia, without long-term current use of insulin (HCC) -     Ambulatory referral to Podiatry -     Hemoglobin A1c -     atorvastatin (LIPITOR) 40 MG tablet; Take 1 tablet (40 mg total) by mouth daily.  Essential hypertension -     lisinopril (ZESTRIL) 5 MG tablet; Take 1 tablet (5 mg total) by mouth daily. For kidney protection with diabetes  Colon cancer screening -     Ambulatory referral to Gastroenterology  Elevated liver enzymes -     CMP14+EGFR  Bilateral foot pain -     Ambulatory referral to Podiatry  GERD without esophagitis -     pantoprazole (PROTONIX) 40 MG tablet; Take 1 tablet (40 mg total) by mouth daily.  Dyslipidemia, goal LDL below 70 -     atorvastatin (LIPITOR)  40 MG tablet; Take 1 tablet (40 mg total) by mouth daily.     Follow Up Instructions Return in about 3 months (around 05/29/2020).     I discussed the assessment and treatment plan with the patient. The patient was provided an opportunity to ask questions and all were answered. The patient agreed with the plan and demonstrated an understanding of the instructions.   The patient was advised to call back or seek an in-person evaluation if the symptoms worsen or if the condition fails to improve as anticipated.  I provided 17 minutes of non-face-to-face time during this encounter including median intraservice time, reviewing previous notes, labs, imaging, medications and explaining diagnosis and management.  Gildardo Pounds, FNP-BC

## 2020-02-28 MED ORDER — LISINOPRIL 5 MG PO TABS: 5.0000 mg | ORAL_TABLET | Freq: Every day | ORAL | 3 refills | Status: DC

## 2020-02-29 ENCOUNTER — Other Ambulatory Visit: Payer: Self-pay

## 2020-02-29 ENCOUNTER — Ambulatory Visit: Payer: 59 | Attending: Nurse Practitioner

## 2020-02-29 MED FILL — TRUE METRIX TEST STRIP: 25 days supply | Qty: 100 | Fill #2

## 2020-03-01 ENCOUNTER — Other Ambulatory Visit: Payer: Self-pay | Admitting: Nurse Practitioner

## 2020-03-01 DIAGNOSIS — R7401 Elevation of levels of liver transaminase levels: Secondary | ICD-10-CM

## 2020-03-01 LAB — CMP14+EGFR
ALT: 263 IU/L — ABNORMAL HIGH (ref 0–32)
AST: 139 IU/L — ABNORMAL HIGH (ref 0–40)
Albumin/Globulin Ratio: 1.4 (ref 1.2–2.2)
Albumin: 4.6 g/dL (ref 3.8–4.8)
Alkaline Phosphatase: 112 IU/L (ref 39–117)
BUN/Creatinine Ratio: 18 (ref 12–28)
BUN: 14 mg/dL (ref 8–27)
Bilirubin Total: 0.4 mg/dL (ref 0.0–1.2)
CO2: 20 mmol/L (ref 20–29)
Calcium: 9.7 mg/dL (ref 8.7–10.3)
Chloride: 102 mmol/L (ref 96–106)
Creatinine, Ser: 0.78 mg/dL (ref 0.57–1.00)
GFR calc Af Amer: 94 mL/min/{1.73_m2} (ref >59)
GFR calc non Af Amer: 81 mL/min/{1.73_m2} (ref >59)
Globulin, Total: 3.3 g/dL (ref 1.5–4.5)
Glucose: 85 mg/dL (ref 65–99)
Potassium: 4.4 mmol/L (ref 3.5–5.2)
Sodium: 139 mmol/L (ref 134–144)
Total Protein: 7.9 g/dL (ref 6.0–8.5)

## 2020-03-01 LAB — HEMOGLOBIN A1C
Est. average glucose Bld gHb Est-mCnc: 120 mg/dL
Hgb A1c MFr Bld: 5.8 % — ABNORMAL HIGH (ref 4.8–5.6)

## 2020-03-05 ENCOUNTER — Ambulatory Visit: Payer: 59 | Attending: Nurse Practitioner

## 2020-03-05 ENCOUNTER — Other Ambulatory Visit: Payer: Self-pay

## 2020-03-05 DIAGNOSIS — R7401 Elevation of levels of liver transaminase levels: Secondary | ICD-10-CM

## 2020-03-07 LAB — HEPATITIS PANEL, ACUTE
Hep A IgM: NEGATIVE
Hep B C IgM: NEGATIVE
Hep C Virus Ab: >11 s/co ratio — ABNORMAL HIGH (ref 0.0–0.9)
Hepatitis B Surface Ag: NEGATIVE

## 2020-03-07 LAB — COMMENT

## 2020-03-07 LAB — CARBOHYDRATE DEFICIENT TRANSF.: CDT: 1 % (ref 0.0–1.3)

## 2020-03-08 ENCOUNTER — Other Ambulatory Visit: Payer: Self-pay | Admitting: Nurse Practitioner

## 2020-03-08 DIAGNOSIS — R768 Other specified abnormal immunological findings in serum: Secondary | ICD-10-CM

## 2020-03-13 ENCOUNTER — Other Ambulatory Visit: Payer: Self-pay

## 2020-03-13 ENCOUNTER — Ambulatory Visit: Payer: 59 | Attending: Nurse Practitioner

## 2020-03-13 ENCOUNTER — Other Ambulatory Visit: Payer: 59 | Admitting: Nurse Practitioner

## 2020-03-13 DIAGNOSIS — R768 Other specified abnormal immunological findings in serum: Secondary | ICD-10-CM

## 2020-03-15 LAB — HCV RNA QUANT
HCV log10: 5.561 log10 IU/mL
Hepatitis C Quantitation: 364000 IU/mL

## 2020-03-17 MED ORDER — PANTOPRAZOLE SODIUM 40 MG PO TBEC: 40.0000 mg | DELAYED_RELEASE_TABLET | Freq: Every day | ORAL | 1 refills | Status: DC

## 2020-03-18 ENCOUNTER — Encounter: Payer: Self-pay | Admitting: Nurse Practitioner

## 2020-03-18 MED ORDER — ATORVASTATIN CALCIUM 40 MG PO TABS: 40.0000 mg | ORAL_TABLET | Freq: Every day | ORAL | 3 refills | Status: DC

## 2020-03-19 ENCOUNTER — Telehealth: Payer: Self-pay | Admitting: *Deleted

## 2020-03-19 MED FILL — PANTOPRAZOLE SOD DR 40 MG T: 40 | 90 days supply | Qty: 90 | Fill #0

## 2020-03-19 MED FILL — ATORVASTATIN CALCIUM 40 MG: 40 | 90 days supply | Qty: 90 | Fill #0

## 2020-03-19 NOTE — Telephone Encounter (Signed)
Dr Lyndel Safe,  This pt was admitted 06-09-2019 for DKA- you performed an EGD on her 06-13-2019 at [facility] as she was inpatient- she had mild presbyesophagus S/p dilation, small transient HH and gastric polyps- there is a TE stating pt needed F/U with you in August- pt was to call and schedule OV and she never did-  She is now on the Schedule for a Direct Colon 4-16- DO you want her to have an OV prior to her Colon?  Or is she OK for this direct?  Lelan Pons PV

## 2020-03-20 NOTE — Telephone Encounter (Signed)
Noted- ok for direct

## 2020-03-20 NOTE — Telephone Encounter (Signed)
She should be okay for direct colonoscopy Thanks for being on top of this RG

## 2020-03-22 ENCOUNTER — Ambulatory Visit (AMBULATORY_SURGERY_CENTER): Payer: Self-pay | Admitting: *Deleted

## 2020-03-22 ENCOUNTER — Other Ambulatory Visit: Payer: Self-pay

## 2020-03-22 VITALS — Temp 97.3°F | Ht <= 58 in | Wt 156.0 lb

## 2020-03-22 DIAGNOSIS — Z1211 Encounter for screening for malignant neoplasm of colon: Secondary | ICD-10-CM

## 2020-03-22 DIAGNOSIS — Z01818 Encounter for other preprocedural examination: Secondary | ICD-10-CM

## 2020-03-22 MED ORDER — NA SULFATE-K SULFATE-MG SULF 17.5-3.13-1.6 GM/177ML PO SOLN: ORAL | 0 refills | Status: DC

## 2020-03-22 MED FILL — LISINOPRIL 5 MG TABLET: 5 | 30 days supply | Qty: 15 | Fill #6

## 2020-03-22 NOTE — Progress Notes (Signed)
Patient is here in-person for PV. Patient denies any allergies to eggs or soy. Patient denies any problems with anesthesia/sedation. Patient denies any oxygen use at home. Patient denies taking any diet/weight loss medications or blood thinners. Patient is not being treated for MRSA or C-diff. EMMI education assisgned to the patient for the procedure, this was explained and instructions given to patient. COVID-19 screening test is on 4/13, the pt is aware. Pt denies constipation.  Patient is aware of our care-partner policy and 0000000 safety protocol.

## 2020-04-03 ENCOUNTER — Other Ambulatory Visit: Payer: Self-pay | Admitting: Gastroenterology

## 2020-04-03 ENCOUNTER — Ambulatory Visit (INDEPENDENT_AMBULATORY_CARE_PROVIDER_SITE_OTHER): Payer: 59

## 2020-04-03 DIAGNOSIS — Z1159 Encounter for screening for other viral diseases: Secondary | ICD-10-CM

## 2020-04-03 LAB — SARS CORONAVIRUS 2 (TAT 6-24 HRS): SARS Coronavirus 2: NEGATIVE

## 2020-04-03 MED FILL — FLUTICASONE PROP 50 MCG SPR: 50 | 30 days supply | Qty: 16 | Fill #2

## 2020-04-03 MED FILL — SUPREP BOWEL PREP KIT: 17.5-3.13-1 | 1 days supply | Qty: 354 | Fill #0

## 2020-04-06 ENCOUNTER — Ambulatory Visit (AMBULATORY_SURGERY_CENTER): Payer: 59 | Admitting: Gastroenterology

## 2020-04-06 ENCOUNTER — Encounter: Payer: Self-pay | Admitting: Gastroenterology

## 2020-04-06 ENCOUNTER — Other Ambulatory Visit: Payer: Self-pay

## 2020-04-06 VITALS — BP 137/71 | HR 76 | Temp 96.6°F | Resp 19 | Ht <= 58 in | Wt 156.0 lb

## 2020-04-06 DIAGNOSIS — Z1211 Encounter for screening for malignant neoplasm of colon: Secondary | ICD-10-CM | POA: Diagnosis not present

## 2020-04-06 DIAGNOSIS — K635 Polyp of colon: Secondary | ICD-10-CM | POA: Diagnosis not present

## 2020-04-06 DIAGNOSIS — D125 Benign neoplasm of sigmoid colon: Secondary | ICD-10-CM

## 2020-04-06 MED ORDER — SODIUM CHLORIDE 0.9 % IV SOLN: 500.0000 mL | Freq: Once | INTRAVENOUS | Status: DC

## 2020-04-06 NOTE — Op Note (Signed)
[place] Patient Name: Maria Robinson Procedure Date: 04/06/2020 9:48 AM MRN: [id number] Endoscopist: Jackquline Denmark , MD Age: 64 Referring MD:  Date of Birth: November 12, 1956 Gender: Female Account #: [id number] Procedure:                Colonoscopy Indications:              Screening for colorectal malignant neoplasm Medicines:                Monitored Anesthesia Care Procedure:                Pre-Anesthesia Assessment:                           - Prior to the procedure, a History and Physical                            was performed, and patient medications and                            allergies were reviewed. The patient's tolerance of                            previous anesthesia was also reviewed. The risks                            and benefits of the procedure and the sedation                            options and risks were discussed with the patient.                            All questions were answered, and informed consent                            was obtained. Prior Anticoagulants: The patient has                            taken no previous anticoagulant or antiplatelet                            agents. ASA Grade Assessment: II - A patient with                            mild systemic disease. After reviewing the risks                            and benefits, the patient was deemed in                            satisfactory condition to undergo the procedure.                           After obtaining informed consent, the colonoscope  was passed under direct vision. Throughout the                            procedure, the patient's blood pressure, pulse, and                            oxygen saturations were monitored continuously. The                            Colonoscope was introduced through the anus and                            advanced to the 2 cm into the ileum. The                            colonoscopy was performed  without difficulty. The                            patient tolerated the procedure well. The quality                            of the bowel preparation was good. The terminal                            ileum, ileocecal valve, appendiceal orifice, and                            rectum were photographed. Scope In: 9:57:29 AM Scope Out: 10:13:08 AM Scope Withdrawal Time: 0 hours 12 minutes 5 seconds  Total Procedure Duration: 0 hours 15 minutes 39 seconds  Findings:                 A 6 mm polyp was found in the proximal sigmoid                            colon. The polyp was sessile. The polyp was removed                            with a cold snare. Resection and retrieval were                            complete.                           A few rare small-mouthed diverticula were found in                            the sigmoid colon.                           Non-bleeding internal hemorrhoids were found during                            retroflexion. The hemorrhoids were small.  The terminal ileum appeared normal.                           The exam was otherwise without abnormality on                            direct and retroflexion views. Complications:            No immediate complications. Estimated Blood Loss:     Estimated blood loss: none. Impression:               - One 6 mm polyp in the proximal sigmoid colon,                            removed with a cold snare. Resected and retrieved.                           - Minimal sigmoid diverticulosis.                           - Otherwise normal colonoscopy to TI. Recommendation:           - Patient has a contact number available for                            emergencies. The signs and symptoms of potential                            delayed complications were discussed with the                            patient. Return to normal activities tomorrow.                            Written discharge  instructions were provided to the                            patient.                           - Resume previous diet.                           - Continue present medications.                           - Await pathology results.                           - Repeat colonoscopy for surveillance based on                            pathology results.                           - Return to GI clinic in 12 weeks.                           -  The findings and recommendations were discussed                            with the patient's family (Maria Robinson). Jackquline Denmark, MD 04/06/2020 10:19:46 AM This report has been signed electronically.

## 2020-04-06 NOTE — Patient Instructions (Signed)
Handouts Provided:  Polyps, Diverticulosis and Hemorrhoids  YOU HAD AN ENDOSCOPIC PROCEDURE TODAY AT THE [facility]:   Refer to the procedure report that was given to you for any specific questions about what was found during the examination.  If the procedure report does not answer your questions, please call your gastroenterologist to clarify.  If you requested that your care partner not be given the details of your procedure findings, then the procedure report has been included in a sealed envelope for you to review at your convenience later.  YOU SHOULD EXPECT: Some feelings of bloating in the abdomen. Passage of more gas than usual.  Walking can help get rid of the air that was put into your GI tract during the procedure and reduce the bloating. If you had a lower endoscopy (such as a colonoscopy or flexible sigmoidoscopy) you may notice spotting of blood in your stool or on the toilet paper. If you underwent a bowel prep for your procedure, you may not have a normal bowel movement for a few days.  Please Note:  You might notice some irritation and congestion in your nose or some drainage.  This is from the oxygen used during your procedure.  There is no need for concern and it should clear up in a day or so.  SYMPTOMS TO REPORT IMMEDIATELY:  Following lower endoscopy (colonoscopy or flexible sigmoidoscopy):  Excessive amounts of blood in the stool  Significant tenderness or worsening of abdominal pains  Swelling of the abdomen that is new, acute  Fever of 100F or higher  For urgent or emergent issues, a gastroenterologist can be reached at any hour by calling [phone]. Do not use MyChart messaging for urgent concerns.    DIET:  We do recommend a small meal at first, but then you may proceed to your regular diet.  Drink plenty of fluids but you should avoid alcoholic beverages for 24 hours.  ACTIVITY:  You should plan to take it easy for the rest of today and you  should NOT DRIVE or use heavy machinery until tomorrow (because of the sedation medicines used during the test).    FOLLOW UP: Our staff will call the number listed on your records 48-72 hours following your procedure to check on you and address any questions or concerns that you may have regarding the information given to you following your procedure. If we do not reach you, we will leave a message.  We will attempt to reach you two times.  During this call, we will ask if you have developed any symptoms of COVID 19. If you develop any symptoms (ie: fever, flu-like symptoms, shortness of breath, cough etc.) before then, please call [phone].  If you test positive for Covid 19 in the 2 weeks post procedure, please call and report this information to us.    If any biopsies were taken you will be contacted by phone or by letter within the next 1-3 weeks.  Please call us at [phone] if you have not heard about the biopsies in 3 weeks.    SIGNATURES/CONFIDENTIALITY: You and/or your care partner have signed paperwork which will be entered into your electronic medical record.  These signatures attest to the fact that that the information above on your After Visit Summary has been reviewed and is understood.  Full responsibility of the confidentiality of this discharge information lies with you and/or your care-partner.  

## 2020-04-06 NOTE — Progress Notes (Signed)
A and O x3. Report to RN. Tolerated MAC anesthesia well.

## 2020-04-06 NOTE — Progress Notes (Signed)
Called to room to assist during endoscopic procedure.  Patient ID and intended procedure confirmed with present staff. Received instructions for my participation in the procedure from the performing physician.  

## 2020-04-10 ENCOUNTER — Telehealth: Payer: Self-pay | Admitting: *Deleted

## 2020-04-10 NOTE — Telephone Encounter (Signed)
  Follow up Call-  Call back number 04/06/2020  Post procedure Call Back phone  # [phone]  Permission to leave phone message Yes  Some recent data might be hidden     Patient questions:  Do you have a fever, pain , or abdominal swelling? No. Pain Score  0 *  Have you tolerated food without any problems? Yes.    Have you been able to return to your normal activities? Yes.    Do you have any questions about your discharge instructions: Diet   No. Medications  No. Follow up visit  No.  Do you have questions or concerns about your Care? No.  Actions: * If pain score is 4 or above: 1. No action needed, pain <4.Have you developed a fever since your procedure? no  2.   Have you had an respiratory symptoms (SOB or cough) since your procedure? no  3.   Have you tested positive for COVID 19 since your procedure no  4.   Have you had any family members/close contacts diagnosed with the COVID 19 since your procedure?  no   If yes to any of these questions please route to Joylene John, RN and Erenest Rasher, RN

## 2020-04-19 MED FILL — LISINOPRIL 5 MG TABLET: 5 | 30 days supply | Qty: 15 | Fill #7

## 2020-04-20 ENCOUNTER — Other Ambulatory Visit: Payer: Self-pay

## 2020-04-20 ENCOUNTER — Encounter: Payer: Self-pay | Admitting: Podiatry

## 2020-04-20 ENCOUNTER — Ambulatory Visit (INDEPENDENT_AMBULATORY_CARE_PROVIDER_SITE_OTHER): Payer: 59 | Admitting: Podiatry

## 2020-04-20 DIAGNOSIS — M2140 Flat foot [pes planus] (acquired), unspecified foot: Secondary | ICD-10-CM

## 2020-04-20 DIAGNOSIS — M76829 Posterior tibial tendinitis, unspecified leg: Secondary | ICD-10-CM | POA: Diagnosis not present

## 2020-04-20 NOTE — Progress Notes (Signed)
.    This patient presents the office stating that she has a painful knot on the inside of her left foot.  She says that the pain has been present for 2 months and previously it was very painful and swollen.  She says she thought it was caused by her [employer] sneakers and she has stopped wearing those sneakers.  Therefore her pain is still present but not the severe type that she initially dealt with.  Patient denies any history of trauma or injury to her left foot.  She  says she has had no self treatment nor sought any professional help.  This patient states that she is walking and walking with her newly bought Chauncy Lean has helped to relieve her pain.  She presents the office today for an evaluation and treatment of this condition.  This patient is diabetic.  Vascular  Dorsalis pedis and posterior tibial pulses are palpable  B/L.  Capillary return  WNL.  Temperature gradient is  WNL.  Skin turgor  WNL  Sensorium  Senn Weinstein monofilament wire  WNL. Normal tactile sensation.  Nail Exam  Patient has normal nails with no evidence of bacterial or fungal infection.  Orthopedic  Exam  Muscle tone and muscle strength  WNL.  No limitations of motion feet  B/L.  No crepitus or joint effusion noted.  Foot type is unremarkable and digits show no abnormalities.  Mild prominence noted navicular left foot medially.  Muscle power  WNL. Pes planus foot type.  Skin  No open lesions.  Normal skin texture and turgor.  PTT tendintis at insertion.  IE.   Added scaphoid pad to her shoes.  Prescribe Mobic. RTC prn.   Gardiner Barefoot DPM

## 2020-04-21 ENCOUNTER — Encounter: Payer: Self-pay | Admitting: Gastroenterology

## 2020-04-23 ENCOUNTER — Telehealth: Payer: Self-pay | Admitting: Nurse Practitioner

## 2020-04-23 ENCOUNTER — Telehealth: Payer: Self-pay | Admitting: *Deleted

## 2020-04-23 NOTE — Telephone Encounter (Signed)
Patient called in and requested for a refill on the listed medication. Patient stated that her blood pressure has been running a little high the past few days. Please follow up at your earliest convenience.

## 2020-04-23 NOTE — Telephone Encounter (Signed)
Pt states she was seen by Dr. Prudence Davidson 04/20/2020 8:30am and he was to send in a medication the the [place].

## 2020-04-24 ENCOUNTER — Telehealth: Payer: Self-pay | Admitting: *Deleted

## 2020-04-24 MED ORDER — MELOXICAM 15 MG PO TABS
15.0000 mg | ORAL_TABLET | Freq: Every day | ORAL | 0 refills | Status: DC
Start: 2020-04-24 — End: 2020-09-07

## 2020-04-24 MED FILL — MELOXICAM 15 MG TABLET: 15 | 30 days supply | Qty: 30 | Fill #0

## 2020-04-25 ENCOUNTER — Other Ambulatory Visit: Payer: Self-pay | Admitting: Nurse Practitioner

## 2020-04-25 DIAGNOSIS — B1710 Acute hepatitis C without hepatic coma: Secondary | ICD-10-CM

## 2020-04-25 NOTE — Telephone Encounter (Signed)
Pt. Stated her blood pressure has been high. Pt. Has not been taking her amlodipine 10mg  and Atorvastatin 40mg .  Pt. Is requesting if PCP can send it [facility].

## 2020-04-26 ENCOUNTER — Other Ambulatory Visit: Payer: Self-pay | Admitting: Nurse Practitioner

## 2020-04-26 DIAGNOSIS — I10 Essential (primary) hypertension: Secondary | ICD-10-CM

## 2020-04-26 MED ORDER — AMLODIPINE BESYLATE 10 MG PO TABS
10.0000 mg | ORAL_TABLET | Freq: Every day | ORAL | 3 refills | Status: DC
Start: 2020-04-26 — End: 2021-01-24

## 2020-04-26 MED ORDER — ATORVASTATIN CALCIUM 40 MG PO TABS
40.0000 mg | ORAL_TABLET | Freq: Every day | ORAL | 3 refills | Status: DC
Start: 2020-04-26 — End: 2020-05-03

## 2020-04-26 MED ORDER — LISINOPRIL 5 MG PO TABS: 5.0000 mg | ORAL_TABLET | Freq: Every day | ORAL | 3 refills | Status: DC

## 2020-04-26 MED FILL — AMLODIPINE BESYLATE 10 MG T: 10 | 30 days supply | Qty: 30 | Fill #0

## 2020-04-27 ENCOUNTER — Other Ambulatory Visit: Payer: Self-pay | Admitting: Nurse Practitioner

## 2020-04-27 MED FILL — GLIMEPIRIDE 2 MG TABS: 2 | 90 days supply | Qty: 90 | Fill #0

## 2020-05-02 ENCOUNTER — Telehealth: Payer: Self-pay | Admitting: Pharmacy Technician

## 2020-05-02 NOTE — Telephone Encounter (Signed)
[facility] Patient Teacher, English as a foreign language completed.    The patient is insured through [employer].  Plan does not cover [place] or [place]a, but will cover [place] with a prior authorization.  We will continue to follow to see if copay assistance is needed.  Venida Jarvis. Nadara Mustard [place] Patient [facility] Phone: [phone] Fax:  [phone]

## 2020-05-03 ENCOUNTER — Ambulatory Visit (INDEPENDENT_AMBULATORY_CARE_PROVIDER_SITE_OTHER): Payer: 59 | Admitting: Infectious Diseases

## 2020-05-03 ENCOUNTER — Encounter: Payer: Self-pay | Admitting: Infectious Diseases

## 2020-05-03 ENCOUNTER — Other Ambulatory Visit: Payer: Self-pay

## 2020-05-03 ENCOUNTER — Telehealth: Payer: Self-pay | Admitting: Pharmacy Technician

## 2020-05-03 VITALS — BP 158/89 | HR 96 | Temp 98.7°F | Ht 59.0 in | Wt 161.0 lb

## 2020-05-03 DIAGNOSIS — B182 Chronic viral hepatitis C: Secondary | ICD-10-CM | POA: Insufficient documentation

## 2020-05-03 DIAGNOSIS — R748 Abnormal levels of other serum enzymes: Secondary | ICD-10-CM

## 2020-05-03 MED ORDER — MAVYRET 100-40 MG PO TABS: 3.0000 | ORAL_TABLET | Freq: Every day | ORAL | 1 refills | Status: DC

## 2020-05-03 NOTE — Patient Instructions (Signed)
Nice to meet you today!    We need to get a little more information about your hepatitis c infection before we start your treatment. I anticipate that we can get you started in a few weeks after we submit approval to your insurance to ensure payment. We may need to place referral for an ultrasound and/or gastroenterology if your blood work indicates more damage to the liver than expected.     ABOUT HEPATITIS C VIRUS:   Chronic Hepatitis C is the most common blood-borne infection in the [place], affecting approximately 3 million people.   It is the leading cause of cirrhosis, liver cancer, and end stage liver disease requiring transplantation when this infection goes untreated for many years   The majority of people who are infected are unaware because there are not many early symptoms that are specific to this and often go undiagnosed until a specific blood test is drawn.    The hepatitis c virus is passed primarily through direct exposure of contaminated blood or body fluids. It is most efficiently transmitted through repeated exposure to infected blood.   Risk for sexual transmission is very low but is possible if there is high frequency of unprotected sexual activity with known hepatitis c partner or multiple partners of known status.   Over time, approximately 60-70% of people can develop some degree of liver disease. Cirrhosis occurs in 10-20% of those with chronic infection. 1-5% will get liver cancer, which has a very high rate of death.    Approximately 15-25% clear the infection without medication (usually in the first 6 months of becoming exposed to virus)   Newer medications provide over 95% cure rate when taken as prescribed    IN GENERAL ABOUT DIET  . Persons living with chronic hepatitis c infection should eat a diet to maintain a healthy weight and avoid nutritional deficiencies.   . Completely avoiding alcohol is the best decision for your liver health. If  unable to do so please limit alcohol to as little as possible to less than 1 standard drink a day - this is very irritating to your liver.  . Limit tylenol use to less than 2,000 mg daily (two extra strength tablets only twice a day)  . If you have cirrhosis of the liver please take no more than 1,000 mg tylenol a day  . Patients with cirrhosis should not have protein restriction; we recommend a protein intake of approximately 1.2-1.5 g/kg/day.   . For patients with cirrhosis and hepatic encephalopathy, the American Association for the Study of Liver Diseases (AASLD) recommended protein intake is 1.2-1.5 g/kg/day.  . If you experience ascites (fluid accumulation in the abdomen associated with severe liver damage / cirrhosis) please limit sodium intake to < 2000 mg a day    UNTIL YOU HAVE BEEN TREATED AND CURED:  . Use condoms with all sexual encounters or practice abstinence to avoid sexual transmission   . No sharing of razors, toothbrushes, nail clippers or anything that could potentially have blood on it.   . If you cut yourself please clean and cover any wounds or open sores to others do not come into contact with your blood.   . If blood spills onto item/surface please clean with 1:10 bleach solution and allow to dry, EVEN if it is dried blood.    GENERAL HELPFUL HINTS ON HCV THERAPY:  1. Stay well-hydrated.  2. Notify the [facility] Clinic of any changes in your other over-the-counter/herbal or prescription medications.    3. If you miss a dose of your medication, take the missed dose as soon as you remember. Return to your regular time/dose schedule the next day.   4.  Do not stop taking your medications without first talking with your healthcare provider.  5.  You will see our pharmacist-specialist within the first 2 weeks of starting your medication to monitor for any possible side effects.  6.  You will have blood work once during treatment 4 weeks after your first pill. Again  soon after treatment is completed and one final lab 3 months after your last pill to ensure cure!   TIPS TO BE SUCCESSFUL WITH DAILY MEDICATION USE:  1. Set a reminder on your phone  2. Try filling out a pill box for the week - pick a day and put one pill for every day during the week so you know right away if you missed a pill.   3. Have a trusted family member ask you about your medications.   4. Smartphone app    Medication we would like to use for you will be :   Mavyret Instructions:  1. Take Mavyret, three tablets (at the same time) daily with food. Please take ALL THREE PILLS AT ONCE. You should take it at approximately the same time every day. Treatment will be for 8 weeks. Do not miss a dose.    2. Do not run out of Mavyret! If you are down to one week of medication left and have not heard about your next shipment, please let us know as soon as possible. You will be given 28 days of treatment at a time and will receive one refill.   3. If you need to start a new medication, prescription from your doctor or over the counter medication, you need to contact us to make sure it does not interfere with Mavyret. There are several medications that can interfere with Mavyret and can make you sick or make the medication not work.  4. If you need to take a medication for acid reflux, you can take omeprazole 20mg daily.     5. Tylenol (acetominophen) and Advil (ibuprofen) are safe to take with [place] if needed for headache, fever, pain.   6. IF YOU ARE ON BIRTH CONTROL PILLS, YOU RECEIVE A SHOT FOR BIRTH CONTROL, OR YOU HAVE AN IUD, please notify your provider to make sure it is safe with Mavyret.   7. DO NOT stop Mavyret unless instructed to by your provider. If you are hospitalized while taking this medication please bring it with you to the hospital to avoid interruption of therapy. Every pill is important!  8. The most common side effects associated with Mavyret include:   o Fatigue o Headache o Nausea o Diarrhea o Insomnia       

## 2020-05-03 NOTE — Assessment & Plan Note (Signed)
New Patient with Chronic Hepatitis C genotype unknown, treatment naive. FIB4 1.64, APRI 1.03 --> statistically low risk for significant hepatic disease. Will check FibroTest and consider repeating RUQ ultrasound with elastography this time. Last US revealed normal appearing liver.   I discussed with the patient the lab findings that confirm chronic hepatitis C as well as the natural history and progression of disease including about 30% of people who develop cirrhosis of the liver if left untreated and once cirrhosis is established there is a 2-7% risk per year of liver cancer and liver failure.  I discussed the importance of treatment and benefits in reducing the risk, even if significant liver fibrosis exists. I also discussed risk for re-infection following treatment should he not continue to modify risk factors.    Patient counseled extensively on limiting acetaminophen to no more than 2 grams daily, avoidance of alcohol.  Transmission discussed with patient including sexual transmission, sharing razors and toothbrush.   Will prescribe appropriate medication based on genotype and coverage   Hepatitis A and B titers to be drawn today with appropriate vaccinations as needed   Pneumovax vaccine at upcoming visit if not previously given  Further work up to include liver staging through non-invasive serum analysis with APRI and FIB4 scores and Liver Fibrosis panel; U/S to follow if discordant or concerning results.  Will call [facility] back once all results are in and counsel on medication over the phone. She will return 4 weeks after starting to meet with pharmacy team and check RNA at that time.

## 2020-05-03 NOTE — Progress Notes (Signed)
Maria Robinson  DOB: 1955-12-28 MRN: [id number] PCP: Gildardo Pounds, NP     Patient Active Problem List   Diagnosis Date Noted  . Chronic hepatitis C with hepatic coma ([place]) 05/03/2020  . PTTD (posterior tibial tendon dysfunction) 04/20/2020  . Pes planus 04/20/2020  . Microcytic anemia 06/22/2019  . Esophageal dysphagia   . Lower abdominal pain   . DKA (diabetic ketoacidoses) ([place]) 06/09/2019  . DM (diabetes mellitus) ([place]) 06/09/2019  . AKI (acute kidney injury) ([place]) 06/09/2019  . Elevated liver enzymes 06/09/2019  . Hyponatremia 06/09/2019  . Muscle weakness (generalized) 06/09/2019  . Essential hypertension 05/09/2019  . Seasonal allergies 05/09/2019  . Uninsured 05/06/2019  . De Quervain's disease (tenosynovitis) 10/05/2017     Subjective:  Maria Robinson is a 64 y.o. female with chronic hepatitis C infection.   She states she had some tests in the past that indicated she may have been infected but it was unclear what the results showed at that time. She has since learned she is infected with Hepatitis C and has been doing some research on her own. She is ready to take treatment and cure this.  Based on discussion her most likely risk for exposure is sexual even as recent as 3 years ago. She is no longer with this partner and has abstained since. She has never injected or snorted drugs in her life. No tattoos or piercings. No history of blood transfusion or dialysis.    Review of Systems  Constitutional: Negative for appetite change, fatigue, fever and unexpected weight change.  Respiratory: Negative for shortness of breath.   Cardiovascular: Negative for chest pain and leg swelling.  Gastrointestinal: Negative for abdominal pain, blood in stool, nausea and vomiting.  Genitourinary: Negative for difficulty urinating and hematuria.  Musculoskeletal: Negative for arthralgias.  Skin: Negative for color change.  Neurological: Negative for dizziness,  tremors and headaches.  Hematological: Negative for adenopathy.  Psychiatric/Behavioral: Negative for confusion.     Past Medical History:  Diagnosis Date  . Allergy   . Cataract   . Diabetes mellitus without complication ([place])   . Hypertension   . Low blood potassium   . Sickle cell trait [facility])     Outpatient Medications Prior to Visit  Medication Sig Dispense Refill  . amLODipine (NORVASC) 10 MG tablet Take 1 tablet (10 mg total) by mouth daily. 90 tablet 3  . fluticasone (FLONASE) 50 MCG/ACT nasal spray Place 2 sprays into both nostrils daily. 16 g 6  . glimepiride (AMARYL) 2 MG tablet TAKE 1 TABLET (2 MG TOTAL) BY MOUTH DAILY BEFORE BREAKFAST. 90 tablet 1  . glucose blood (TRUE METRIX BLOOD GLUCOSE TEST) test strip Use as instructed once daily 100 each 12  . lisinopril (ZESTRIL) 5 MG tablet Take 1 tablet (5 mg total) by mouth daily. For kidney protection with diabetes 90 tablet 3  . meloxicam (MOBIC) 15 MG tablet Take 1 tablet (15 mg total) by mouth daily. 30 tablet 0  . pantoprazole (PROTONIX) 40 MG tablet Take 40 mg by mouth daily.    . TRUEplus Lancets 28G MISC Use as instructed once daily 100 each 12  . atorvastatin (LIPITOR) 40 MG tablet Take 1 tablet (40 mg total) by mouth daily. 90 tablet 3  . dicyclomine (BENTYL) 10 MG capsule Take 10 mg by mouth 3 (three) times daily as needed for spasms. PRN    . psyllium (METAMUCIL) 58.6 % powder Take 1 packet by mouth daily.  No facility-administered medications prior to visit.     Allergies  Allergen Reactions  . Penicillins Swelling and Rash    Did it involve swelling of the face/tongue/throat, SOB, or low BP? y Did it involve sudden or severe rash/hives, skin peeling, or any reaction on the inside of your mouth or nose? y Did you need to seek medical attention at a hospital or doctor's office? y When did it last happen? 2026 If all above answers are "NO", may proceed with cephalosporin use.    Social History    Tobacco Use  . Smoking status: Never Smoker  . Smokeless tobacco: Never Used  Substance Use Topics  . Alcohol use: No  . Drug use: No    Family History  Problem Relation Age of Onset  . Diabetes Father   . Colon cancer Neg Hx   . Colon polyps Neg Hx   . Esophageal cancer Neg Hx   . Rectal cancer Neg Hx   . Stomach cancer Neg Hx     Objective:   Vitals:   05/03/20 1017  BP: (!) 158/89  Pulse: 96  Temp: 98.7 F (37.1 C)  SpO2: 98%  Weight: 161 lb (73 kg)  Height: 4\' 11"  (1.499 m)   Body mass index is 32.52 kg/m.   Physical Exam HENT:     Mouth/Throat:     Mouth: No oral lesions.     Dentition: No dental abscesses.  Cardiovascular:     Rate and Rhythm: Normal rate and regular rhythm.     Heart sounds: Normal heart sounds.  Pulmonary:     Effort: Pulmonary effort is normal.     Breath sounds: Normal breath sounds.  Abdominal:     General: There is no distension.     Palpations: Abdomen is soft.     Tenderness: There is no abdominal tenderness.  Musculoskeletal:        General: No tenderness. Normal range of motion.  Lymphadenopathy:     Cervical: No cervical adenopathy.  Skin:    General: Skin is warm and dry.     Findings: No rash.  Neurological:     Mental Status: She is alert and oriented to person, place, and time.  Psychiatric:        Judgment: Judgment normal.     Lab Results: Lab Results  Component Value Date   WBC 9.4 01/04/2020   HGB 12.5 01/04/2020   HCT 39.0 01/04/2020   MCV 73.3 (L) 01/04/2020   PLT 329 01/04/2020    Lab Results  Component Value Date   CREATININE 0.78 02/29/2020   BUN 14 02/29/2020   NA 139 02/29/2020   K 4.4 02/29/2020   CL 102 02/29/2020   CO2 20 02/29/2020    Lab Results  Component Value Date   ALT 263 (H) 02/29/2020   AST 139 (H) 02/29/2020   ALKPHOS 112 02/29/2020   BILITOT 0.4 02/29/2020     Assessment & Plan:   Problem List Items Addressed This Visit      Unprioritized   Elevated liver  enzymes    Likely due to chronic hepatitis C infection - she does not drink alcohol or use drugs.       Chronic hepatitis C with hepatic coma ([place]) - Primary    New Patient with Chronic Hepatitis C genotype unknown, treatment naive. FIB4 1.64, APRI 1.03 --> statistically low risk for significant hepatic disease. Will check FibroTest and consider repeating RUQ ultrasound with elastography this time. Last  US revealed normal appearing liver.   I discussed with the patient the lab findings that confirm chronic hepatitis C as well as the natural history and progression of disease including about 30% of people who develop cirrhosis of the liver if left untreated and once cirrhosis is established there is a 2-7% risk per year of liver cancer and liver failure.  I discussed the importance of treatment and benefits in reducing the risk, even if significant liver fibrosis exists. I also discussed risk for re-infection following treatment should he not continue to modify risk factors.    Patient counseled extensively on limiting acetaminophen to no more than 2 grams daily, avoidance of alcohol.  Transmission discussed with patient including sexual transmission, sharing razors and toothbrush.   Will prescribe appropriate medication based on genotype and coverage   Hepatitis A and B titers to be drawn today with appropriate vaccinations as needed   Pneumovax vaccine at upcoming visit if not previously given  Further work up to include liver staging through non-invasive serum analysis with APRI and FIB4 scores and Liver Fibrosis panel; U/S to follow if discordant or concerning results.  Will call [facility] back once all results are in and counsel on medication over the phone. She will return 4 weeks after starting to meet with pharmacy team and check RNA at that time.        Relevant Medications   Glecaprevir-Pibrentasvir (MAVYRET) 100-40 MG TABS   Other Relevant Orders   Liver Fibrosis,  FibroTest-ActiTest   Protime-INR   Hepatitis B surface antigen   Hepatitis B surface antibody,qualitative   Hepatitis A Ab, Total   Hepatitis C genotype      Janene Madeira, MSN, NP-C [facility] for [facility] [place] Pager: [phone] Office: [phone]  05/03/20  10:55 AM

## 2020-05-03 NOTE — Assessment & Plan Note (Signed)
Likely due to chronic hepatitis C infection - she does not drink alcohol or use drugs.

## 2020-05-03 NOTE — Telephone Encounter (Signed)
[facility] Patient Advocate Encounter    Findings of the benefits investigation:   Insurance: [place]  Prior Authorization: will begin insurance process through promptpa once lab documentation returns. Any HepC medication can be prescribed per insurance. We will first attempt [place] because of her PPI  Patient would like to have their medication filled at [facility] and mailed to her home address. She normally fills at [facility] but for the two fills of this medication, she wants [employer]. [phone] best number to call before setting up first shipment.  Bartholomew Crews, CPhT Specialty Pharmacy Patient [facility] for Infectious Disease Phone: [phone] Fax: [phone] 05/03/2020 11:16 AM

## 2020-05-07 MED FILL — LISINOPRIL 5 MG TABLET: 5 | 90 days supply | Qty: 90 | Fill #0

## 2020-05-16 ENCOUNTER — Telehealth: Payer: Self-pay | Admitting: Pharmacy Technician

## 2020-05-16 NOTE — Telephone Encounter (Addendum)
[facility] Patient Advocate Encounter   Received notification from [employer] that prior authorization for [place] is required.   PA submitted on 05/16/2020 Key [id number] EOC ID Member ID: [id number] Status is pending on promptpa portal    [facility] Clinic will continue to follow. F-score lab is still pending as of 05/26  Bartholomew Crews, [place] Patient [facility] for Infectious Disease Phone: [phone] Fax: [phone] 05/16/2020 10:17 AM

## 2020-05-17 LAB — LIVER FIBROSIS, FIBROTEST-ACTITEST
ALT: 178 U/L — ABNORMAL HIGH (ref 6–29)
Alpha-2-Macroglobulin: 160 mg/dL (ref 106–279)
Apolipoprotein A1: 239 mg/dL — ABNORMAL HIGH (ref 101–198)
Bilirubin: 0.4 mg/dL (ref 0.2–1.2)
Fibrosis Score: 0.1
GGT: 154 U/L — ABNORMAL HIGH (ref 3–65)
Haptoglobin: 148 mg/dL (ref 43–212)
Necroinflammat ACT Score: 0.72
Reference ID: 3405254

## 2020-05-17 LAB — HEPATITIS A ANTIBODY, TOTAL: Hepatitis A AB,Total: NONREACTIVE

## 2020-05-17 LAB — HEPATITIS B SURFACE ANTIGEN: Hepatitis B Surface Ag: NONREACTIVE

## 2020-05-17 LAB — HEPATITIS C GENOTYPE: HCV Genotype: 1

## 2020-05-17 LAB — PROTIME-INR
INR: 0.9
Prothrombin Time: 10.1 s (ref 9.0–11.5)

## 2020-05-17 LAB — HEPATITIS B SURFACE ANTIBODY,QUALITATIVE: Hep B S Ab: REACTIVE — AB

## 2020-05-18 NOTE — Telephone Encounter (Addendum)
[facility] Patient Advocate Encounter  Prior Authorization for Mavyret has been approved.    Effective dates: 05/18/2020 through 07/13/2020 Insurance is requiring the medication to be shipped and processed through [employer]. This will also require a 24-hour override for cost exceeds max.   [facility] Clinic will continue to follow.  Bartholomew Crews, CPhT Specialty Pharmacy Patient [facility] for Infectious Disease Phone: [phone] Fax: [phone] 05/18/2020 10:03 AM

## 2020-05-22 ENCOUNTER — Telehealth: Payer: Self-pay | Admitting: Pharmacist

## 2020-05-22 ENCOUNTER — Encounter: Payer: Self-pay | Admitting: Pharmacy Technician

## 2020-05-22 DIAGNOSIS — B182 Chronic viral hepatitis C: Secondary | ICD-10-CM

## 2020-05-22 MED ORDER — MAVYRET 100-40 MG PO TABS: 3.0000 | ORAL_TABLET | Freq: Every day | ORAL | 1 refills | Status: DC

## 2020-05-22 NOTE — Telephone Encounter (Signed)
Patient is approved to receive Mavyret x 8 weeks for chronic Hepatitis C infection. Counseled patient to take all three tablets of Mavyret daily with food.  Counseled patient the need to take all three tablets together and to not separate them out during the day. Encouraged patient not to miss any doses and explained how their chance of cure could go down with each dose missed. Counseled patient on what to do if dose is missed - if it is closer to the missed dose take immediately; if closer to next dose then skip dose and take the next dose at the usual time.   Counseled patient on common side effects such as headache, fatigue, and nausea and that these normally decrease with time. I reviewed patient medications and found no drug interactions. Discussed with patient that there are several drug interactions with Mavyret and instructed patient to call the clinic if she wishes to start a new medication during course of therapy. Also advised patient to call if she experiences any side effects. Patient will follow-up with me in the pharmacy clinic on 06/28/20.

## 2020-05-22 NOTE — Progress Notes (Signed)
I called Ms. [place] to review her lab results - no findings concerning for fibrosis which is concordent with other non-invasive staging. She will begin [place] for genotype 1 (cannot exclude type 6).

## 2020-06-01 ENCOUNTER — Other Ambulatory Visit: Payer: Self-pay | Admitting: Pharmacist

## 2020-06-01 ENCOUNTER — Other Ambulatory Visit: Payer: Self-pay

## 2020-06-01 ENCOUNTER — Ambulatory Visit: Payer: 59 | Attending: Nurse Practitioner | Admitting: Nurse Practitioner

## 2020-06-01 ENCOUNTER — Encounter: Payer: Self-pay | Admitting: Nurse Practitioner

## 2020-06-01 ENCOUNTER — Telehealth: Payer: Self-pay | Admitting: Pharmacy Technician

## 2020-06-01 VITALS — BP 153/88 | HR 93 | Temp 97.2°F | Resp 16 | Wt 165.0 lb

## 2020-06-01 DIAGNOSIS — B182 Chronic viral hepatitis C: Secondary | ICD-10-CM

## 2020-06-01 DIAGNOSIS — I10 Essential (primary) hypertension: Secondary | ICD-10-CM | POA: Diagnosis not present

## 2020-06-01 DIAGNOSIS — E1165 Type 2 diabetes mellitus with hyperglycemia: Secondary | ICD-10-CM | POA: Diagnosis not present

## 2020-06-01 LAB — GLUCOSE, POCT (MANUAL RESULT ENTRY): POC Glucose: 213 mg/dl — AB (ref 70–99)

## 2020-06-01 MED ORDER — LISINOPRIL 10 MG PO TABS: 10.0000 mg | ORAL_TABLET | Freq: Every day | ORAL | 3 refills | Status: DC

## 2020-06-01 MED ORDER — MAVYRET 100-40 MG PO TABS: 3.0000 | ORAL_TABLET | Freq: Every day | ORAL | 1 refills | Status: DC

## 2020-06-01 MED ORDER — GLIMEPIRIDE 4 MG PO TABS: 4.0000 mg | ORAL_TABLET | Freq: Every day | ORAL | 1 refills | Status: DC

## 2020-06-01 NOTE — Progress Notes (Signed)
Assessment & Plan:  Maria Robinson was seen today for diabetes and hypertension.  Diagnoses and all orders for this visit:  Type 2 diabetes mellitus with hyperglycemia, without long-term current use of insulin (HCC) -     POCT glucose (manual entry) -     CMP14+EGFR -     Lipid panel Continue blood sugar control as discussed in office today, low carbohydrate diet, and regular physical exercise as tolerated, 150 minutes per week (30 min each day, 5 days per week, or 50 min 3 days per week). Keep blood sugar logs with fasting goal of 90-130 mg/dl, post prandial (after you eat) less than 180.  For Hypoglycemia: BS <60 and Hyperglycemia BS >400; contact the clinic ASAP. Annual eye exams and foot exams are recommended.   Essential hypertension -     lisinopril (ZESTRIL) 10 MG tablet; Take 1 tablet (10 mg total) by mouth daily. Continue all antihypertensives as prescribed.  Remember to bring in your blood pressure log with you for your follow up appointment.  DASH/Mediterranean Diets are healthier choices for HTN.      Patient has been counseled on age-appropriate routine health concerns for screening and prevention. These are reviewed and up-to-date. Referrals have been placed accordingly. Immunizations are up-to-date or declined.    Subjective:   Chief Complaint  Patient presents with  . Diabetes  . Hypertension   HPI Maria Robinson 64 y.o. female presents to office today for follow up.  She is seeing infectious disease for chronic hepatitis C.  Currently still waiting on her [place].  She states she received an approval letter from her insurance provider however has not received the medication yet.  I will try to reach out to her ID provider and pharmacist regarding this.  ESSENTIAL HYPERTENSION Blood pressure is not well controlled today.  Also her weight is up which may be contributing to the increased blood pressure.  She endorses medication adherence taking amlodipine 10 mg daily  and lisinopril 5 mg daily.  At this time I will increase her lisinopril from 5 mg to 10 mg today. Denies chest pain, shortness of breath, palpitations, lightheadedness, dizziness, headaches or BLE edema.  BP Readings from Last 3 Encounters:  06/01/20 (!) 153/88  05/03/20 (!) 158/89  04/06/20 137/71   DM TYPE 2 Blood glucose level is elevated today. She endorses high readings at home recently due to lack of dietary adherence, weight gain and decreased exercise. Will increase glimepiride 2 mg daily to 4 mg today.  She denies any symptoms of hypo or hyperglycemia.   Lab Results  Component Value Date   HGBA1C 5.8 (H) 02/29/2020    Review of Systems  Constitutional: Negative for fever, malaise/fatigue and weight loss.  HENT: Negative.  Negative for nosebleeds.   Eyes: Negative.  Negative for blurred vision, double vision and photophobia.  Respiratory: Negative.  Negative for cough and shortness of breath.   Cardiovascular: Negative.  Negative for chest pain, palpitations and leg swelling.  Gastrointestinal: Negative.  Negative for heartburn, nausea and vomiting.  Musculoskeletal: Negative.  Negative for myalgias.  Neurological: Negative.  Negative for dizziness, focal weakness, seizures and headaches.  Psychiatric/Behavioral: Negative.  Negative for suicidal ideas.    Past Medical History:  Diagnosis Date  . Allergy   . Cataract   . Diabetes mellitus without complication ([place])   . Hypertension   . Low blood potassium   . Sickle cell trait [facility])     Past Surgical History:  Procedure Laterality Date  .  BIOPSY  06/13/2019   Procedure: BIOPSY;  Surgeon: Jackquline Denmark, MD;  Location: [facility];  Service: Endoscopy;;  . CESAREAN SECTION     x 3  . ESOPHAGOGASTRODUODENOSCOPY (EGD) WITH PROPOFOL N/A 06/13/2019   Procedure: ESOPHAGOGASTRODUODENOSCOPY (EGD) WITH PROPOFOL;  Surgeon: Jackquline Denmark, MD;  Location: [facility] END[facility];  Service: Endoscopy;  Laterality: N/A;  . HAND SURGERY Right     . MALONEY DILATION  06/13/2019   Procedure: Venia Minks DILATION;  Surgeon: Jackquline Denmark, MD;  Location: [facility];  Service: Endoscopy;;    Family History  Problem Relation Age of Onset  . Diabetes Father   . Colon cancer Neg Hx   . Colon polyps Neg Hx   . Esophageal cancer Neg Hx   . Rectal cancer Neg Hx   . Stomach cancer Neg Hx     Social History Reviewed with no changes to be made today.   Outpatient Medications Prior to Visit  Medication Sig Dispense Refill  . amLODipine (NORVASC) 10 MG tablet Take 1 tablet (10 mg total) by mouth daily. 90 tablet 3  . fluticasone (FLONASE) 50 MCG/ACT nasal spray Place 2 sprays into both nostrils daily. 16 g 6  . Glecaprevir-Pibrentasvir (MAVYRET) 100-40 MG TABS Take 3 tablets by mouth daily with breakfast. 84 tablet 1  . glucose blood (TRUE METRIX BLOOD GLUCOSE TEST) test strip Use as instructed once daily 100 each 12  . meloxicam (MOBIC) 15 MG tablet Take 1 tablet (15 mg total) by mouth daily. 30 tablet 0  . pantoprazole (PROTONIX) 40 MG tablet Take 40 mg by mouth daily.    . TRUEplus Lancets 28G MISC Use as instructed once daily 100 each 12  . glimepiride (AMARYL) 2 MG tablet TAKE 1 TABLET (2 MG TOTAL) BY MOUTH DAILY BEFORE BREAKFAST. 90 tablet 1  . lisinopril (ZESTRIL) 5 MG tablet Take 1 tablet (5 mg total) by mouth daily. For kidney protection with diabetes 90 tablet 3   No facility-administered medications prior to visit.    Allergies  Allergen Reactions  . Penicillins Swelling and Rash    Did it involve swelling of the face/tongue/throat, SOB, or low BP? y Did it involve sudden or severe rash/hives, skin peeling, or any reaction on the inside of your mouth or nose? y Did you need to seek medical attention at a hospital or doctor's office? y When did it last happen? 2026 If all above answers are "NO", may proceed with cephalosporin use.       Objective:    BP (!) 153/88   Pulse 93   Temp (!) 97.2 F (36.2 C)   Resp 16    Wt 165 lb (74.8 kg)   SpO2 97%   BMI 33.33 kg/m  Wt Readings from Last 3 Encounters:  06/01/20 165 lb (74.8 kg)  05/03/20 161 lb (73 kg)  04/06/20 156 lb (70.8 kg)    Physical Exam Vitals and nursing note reviewed.  Constitutional:      Appearance: She is well-developed.  HENT:     Head: Normocephalic and atraumatic.  Cardiovascular:     Rate and Rhythm: Normal rate and regular rhythm.     Heart sounds: Normal heart sounds. No murmur heard.  No friction rub. No gallop.   Pulmonary:     Effort: Pulmonary effort is normal. No tachypnea or respiratory distress.     Breath sounds: Normal breath sounds. No decreased breath sounds, wheezing, rhonchi or rales.  Chest:     Chest wall: No tenderness.  Abdominal:     General: Bowel sounds are normal.     Palpations: Abdomen is soft.  Musculoskeletal:        General: Normal range of motion.     Cervical back: Normal range of motion.  Skin:    General: Skin is warm and dry.  Neurological:     Mental Status: She is alert and oriented to person, place, and time.     Coordination: Coordination normal.  Psychiatric:        Behavior: Behavior normal. Behavior is cooperative.        Thought Content: Thought content normal.        Judgment: Judgment normal.        Patient has been counseled extensively about nutrition and exercise as well as the importance of adherence with medications and regular follow-up. The patient was given clear instructions to go to ER or return to medical center if symptoms don't improve, worsen or new problems develop. The patient verbalized understanding.   Follow-up: Return for  3 weeks [place] for BP Recheck and BMP. See me in 3 months .   Gildardo Pounds, FNP-BC [facility] [place], [place]   06/01/2020, 10:58 AM

## 2020-06-01 NOTE — Telephone Encounter (Signed)
[facility] Patient Advocate Encounter  Patient called this morning and reported she still has not received her medication from [facility]. I was told by Arloa Koh it would arrive by 05/25/2020.  When I called [facility] today, they reported that the script had been sent to mail order instead of Specialty and are not able to transfer over despite the fact that they are in the same building. I provided them everything needed to set up the patient's profile so she would not have to. They said they have people on staff to get the copay down if there is one.   They provided information for resending the script: NABP:[id number] [facility] phone:[phone]  Left voicemail for the patient as an update.  Anticipated date for them to reach out to the patient to ship is 06/14-06/16

## 2020-06-02 LAB — LIPID PANEL
Chol/HDL Ratio: 2.5 ratio (ref 0.0–4.4)
Cholesterol, Total: 171 mg/dL (ref 100–199)
HDL: 68 mg/dL (ref >39)
LDL Chol Calc (NIH): 75 mg/dL (ref 0–99)
Triglycerides: 167 mg/dL — ABNORMAL HIGH (ref 0–149)
VLDL Cholesterol Cal: 28 mg/dL (ref 5–40)

## 2020-06-02 LAB — CMP14+EGFR
ALT: 209 IU/L — ABNORMAL HIGH (ref 0–32)
AST: 137 IU/L — ABNORMAL HIGH (ref 0–40)
Albumin/Globulin Ratio: 1.2 (ref 1.2–2.2)
Albumin: 4.6 g/dL (ref 3.8–4.8)
Alkaline Phosphatase: 171 IU/L — ABNORMAL HIGH (ref 48–121)
BUN/Creatinine Ratio: 21 (ref 12–28)
BUN: 19 mg/dL (ref 8–27)
Bilirubin Total: 0.4 mg/dL (ref 0.0–1.2)
CO2: 20 mmol/L (ref 20–29)
Calcium: 9.2 mg/dL (ref 8.7–10.3)
Chloride: 100 mmol/L (ref 96–106)
Creatinine, Ser: 0.92 mg/dL (ref 0.57–1.00)
GFR calc Af Amer: 77 mL/min/{1.73_m2} (ref >59)
GFR calc non Af Amer: 66 mL/min/{1.73_m2} (ref >59)
Globulin, Total: 3.7 g/dL (ref 1.5–4.5)
Glucose: 193 mg/dL — ABNORMAL HIGH (ref 65–99)
Potassium: 6.1 mmol/L — ABNORMAL HIGH (ref 3.5–5.2)
Sodium: 135 mmol/L (ref 134–144)
Total Protein: 8.3 g/dL (ref 6.0–8.5)

## 2020-06-03 ENCOUNTER — Other Ambulatory Visit: Payer: Self-pay | Admitting: Nurse Practitioner

## 2020-06-03 DIAGNOSIS — E875 Hyperkalemia: Secondary | ICD-10-CM

## 2020-06-03 MED ORDER — SODIUM POLYSTYRENE SULFONATE 15 GM/60ML PO SUSP: 15.0000 g | Freq: Once | ORAL | 0 refills | Status: AC

## 2020-06-04 MED FILL — SPS 15 GM/60 ML SUSPENSION: 15 | 60 days supply | Qty: 60 | Fill #0

## 2020-06-07 ENCOUNTER — Ambulatory Visit: Payer: 59 | Attending: Nurse Practitioner

## 2020-06-07 ENCOUNTER — Telehealth: Payer: Self-pay

## 2020-06-07 ENCOUNTER — Other Ambulatory Visit: Payer: Self-pay

## 2020-06-07 DIAGNOSIS — E875 Hyperkalemia: Secondary | ICD-10-CM

## 2020-06-07 LAB — POTASSIUM: Potassium: 3.9 mmol/L (ref 3.5–5.2)

## 2020-06-07 NOTE — Telephone Encounter (Signed)
Will route to PCP 

## 2020-06-07 NOTE — Telephone Encounter (Signed)
Lab called to report that patient's potassium level was 3.9.  Please follow up with patient or PCP.

## 2020-06-08 ENCOUNTER — Other Ambulatory Visit: Payer: Self-pay | Admitting: Nurse Practitioner

## 2020-06-08 DIAGNOSIS — E876 Hypokalemia: Secondary | ICD-10-CM

## 2020-06-08 NOTE — Telephone Encounter (Signed)
[facility] Patient Advocate Encounter  [place] left a voicemail that they would need clinical information to make sure the patient qualifies for receiving Mavyret. I called [facility] back at [phone] and provided them the genotype, RNA, cirrhosis and treatment history. They indicated the information qualified her to receive the medication and they would reach out to her Saturday or Monday to setup first shipment. This is a delay from their original promised shipment date.   Bartholomew Crews, CPhT Specialty Pharmacy Patient [facility] Phone: [phone] Fax: [phone] 06/08/2020 5:13 PM

## 2020-06-08 NOTE — Telephone Encounter (Signed)
That is a normal value. No further actions required

## 2020-06-13 ENCOUNTER — Encounter: Payer: Self-pay | Admitting: Pharmacy Technician

## 2020-06-15 ENCOUNTER — Ambulatory Visit: Payer: 59 | Admitting: Podiatry

## 2020-06-22 ENCOUNTER — Ambulatory Visit: Payer: 59 | Admitting: Pharmacist

## 2020-06-28 ENCOUNTER — Ambulatory Visit: Payer: 59 | Admitting: Pharmacist

## 2020-06-29 ENCOUNTER — Other Ambulatory Visit: Payer: Self-pay | Admitting: Pharmacist

## 2020-06-29 DIAGNOSIS — E1165 Type 2 diabetes mellitus with hyperglycemia: Secondary | ICD-10-CM

## 2020-06-29 MED ORDER — ONETOUCH DELICA PLUS LANCET33G MISC: 2 refills | Status: DC

## 2020-06-29 MED ORDER — GLUCOSE BLOOD VI STRP: ORAL_STRIP | 2 refills | Status: DC

## 2020-06-29 MED ORDER — ONETOUCH VERIO REFLECT W/DEVICE KIT: PACK | 0 refills | Status: DC

## 2020-06-29 MED FILL — LISINOPRIL 10 MG TABS: 10 | 30 days supply | Qty: 30 | Fill #0

## 2020-06-29 MED FILL — ONE TOUCH VERIO TEST STRIP: 30 days supply | Qty: 100 | Fill #0

## 2020-06-29 MED FILL — GLIMEPIRIDE 4 MG TABS: 4 | 30 days supply | Qty: 30 | Fill #0

## 2020-06-29 MED FILL — ONETOUCH DELICA PLUS LANCET: 30 days supply | Qty: 100 | Fill #0

## 2020-06-29 MED FILL — ONETOUCH VERIO REFLECT W/DE: W/DEVICE | 30 days supply | Qty: 1 | Fill #0

## 2020-07-02 ENCOUNTER — Ambulatory Visit: Payer: 59 | Attending: Internal Medicine | Admitting: Pharmacist

## 2020-07-02 ENCOUNTER — Encounter: Payer: Self-pay | Admitting: Pharmacist

## 2020-07-02 ENCOUNTER — Other Ambulatory Visit: Payer: Self-pay

## 2020-07-02 VITALS — BP 142/78 | HR 84

## 2020-07-02 DIAGNOSIS — E1165 Type 2 diabetes mellitus with hyperglycemia: Secondary | ICD-10-CM

## 2020-07-02 DIAGNOSIS — I10 Essential (primary) hypertension: Secondary | ICD-10-CM

## 2020-07-02 DIAGNOSIS — E876 Hypokalemia: Secondary | ICD-10-CM | POA: Diagnosis not present

## 2020-07-02 NOTE — Progress Notes (Signed)
   S:    PCP: [place]   Patient arrives in good spirits. Presents to the clinic for hypertension evaluation, counseling, and management.  Patient was referred and last seen by Primary Care Provider on 06/01/2020.   Medication adherence reported. Took both this morning.    Current BP Medications include:  Amlodipine 10 mg daily, lisinopril 10 mg daily    Antihypertensives tried in the past include: amlodipine 5 mg (now takes 10 mg daily), lisinopril 5 mg (now takes 10 mg daily), Hyzaar 50-12.5 mg daily (unclear reason  Dietary habits include: compliant with salt restriction; denies excessive intake of caffeine  Exercise habits include: runs on a treadmill and walks daily  Family / Social history: FHx significant for DM; never smoker; denies alcohol use  ASCVD risk factors include: none    O:  Vitals:   07/02/20 0911  BP: (!) 142/78  Pulse: 84   Home BP readings: none reported  Last 3 Office BP readings: BP Readings from Last 3 Encounters:  06/01/20 (!) 153/88  05/03/20 (!) 158/89  04/06/20 137/71    BMET    Component Value Date/Time   NA 135 06/01/2020 1020   K 3.9 06/07/2020 0849   CL 100 06/01/2020 1020   CO2 20 06/01/2020 1020   GLUCOSE 193 (H) 06/01/2020 1020   GLUCOSE 178 (H) 06/14/2019 0807   BUN 19 06/01/2020 1020   CREATININE 0.92 06/01/2020 1020   CALCIUM 9.2 06/01/2020 1020   GFRNONAA 66 06/01/2020 1020   GFRAA 77 06/01/2020 1020    Renal function: CrCl cannot be calculated (Patient's most recent lab result is older than the maximum 21 days allowed.).  Clinical ASCVD: No  The 10-year ASCVD risk score Mikey Bussing DC Jr., et al., 2013) is: 22.9%   Values used to calculate the score:     Age: 64 years     Sex: Female     Is Non-Hispanic African American: Yes     Diabetic: Yes     Tobacco smoker: No     Systolic Blood Pressure: 703 mmHg     Is BP treated: Yes     HDL Cholesterol: 68 mg/dL     Total Cholesterol: 171 mg/dL  A/P: Hypertension longstanding  currently above goal on current medications. BP Goal = < 130/80 mmHg. Medication adherence reported. Patient has a history of recent hyperkalemia noted on 06/01/20. She completed Kayexalate with repeat level WNL on 06/07/2020.   Has BMP entered today from PCP. I will add urine microalb:scr ratio as she is due for diabetic nephropathy screening. Regarding her BP, she is above goal but improved. With electrolyte hx, will await results of today's labs. I recommend to increase lisinopril with close follow-up in the future if BP remains above goal.   -Continued current regimen.  -F/u labs ordered - BMP, Microalbumin/Creatinine ratio -Counseled on lifestyle modifications for blood pressure control including reduced dietary sodium, increased exercise, adequate sleep.  Results reviewed and written information provided.   Total time in face-to-face counseling 15 minutes.   F/U Clinic Visit in September with PCP. Sooner pending lab results.   Benard Halsted, PharmD, [place] [phone]

## 2020-07-03 LAB — MICROALBUMIN / CREATININE URINE RATIO
Creatinine, Urine: 8.7 mg/dL
Microalbumin, Urine: <3 ug/mL

## 2020-07-03 LAB — BASIC METABOLIC PANEL
BUN/Creatinine Ratio: 27 (ref 12–28)
BUN: 23 mg/dL (ref 8–27)
CO2: 16 mmol/L — ABNORMAL LOW (ref 20–29)
Calcium: 10 mg/dL (ref 8.7–10.3)
Chloride: 104 mmol/L (ref 96–106)
Creatinine, Ser: 0.84 mg/dL (ref 0.57–1.00)
GFR calc Af Amer: 86 mL/min/{1.73_m2} (ref >59)
GFR calc non Af Amer: 74 mL/min/{1.73_m2} (ref >59)
Glucose: 102 mg/dL — ABNORMAL HIGH (ref 65–99)
Potassium: 4.4 mmol/L (ref 3.5–5.2)
Sodium: 140 mmol/L (ref 134–144)

## 2020-07-09 MED FILL — AMLODIPINE BESYLATE 10 MG T: 10 | 30 days supply | Qty: 30 | Fill #2

## 2020-07-12 ENCOUNTER — Other Ambulatory Visit: Payer: Self-pay

## 2020-07-12 ENCOUNTER — Ambulatory Visit (INDEPENDENT_AMBULATORY_CARE_PROVIDER_SITE_OTHER): Payer: 59 | Admitting: Pharmacist

## 2020-07-12 DIAGNOSIS — B182 Chronic viral hepatitis C: Secondary | ICD-10-CM

## 2020-07-12 DIAGNOSIS — R748 Abnormal levels of other serum enzymes: Secondary | ICD-10-CM

## 2020-07-12 NOTE — Progress Notes (Signed)
HPI: Maria Robinson is a 64 y.o. female who presents to the [facility] clinic for Hepatitis C follow-up.  Medication: Mavyret x 8 weeks  Start Date: ~06/15/20  Hepatitis C Genotype: 1  Fibrosis Score: F0  Hepatitis C RNA: 364,000 on 03/13/20  Patient Active Problem List   Diagnosis Date Noted  . Chronic hepatitis C with hepatic coma ([place]) 05/03/2020  . PTTD (posterior tibial tendon dysfunction) 04/20/2020  . Pes planus 04/20/2020  . Microcytic anemia 06/22/2019  . Esophageal dysphagia   . Lower abdominal pain   . DKA (diabetic ketoacidoses) ([place]) 06/09/2019  . DM (diabetes mellitus) ([place]) 06/09/2019  . AKI (acute kidney injury) ([place]) 06/09/2019  . Elevated liver enzymes 06/09/2019  . Hyponatremia 06/09/2019  . Muscle weakness (generalized) 06/09/2019  . Essential hypertension 05/09/2019  . Seasonal allergies 05/09/2019  . Uninsured 05/06/2019  . De Quervain's disease (tenosynovitis) 10/05/2017    Patient's Medications  New Prescriptions   No medications on file  Previous Medications   AMLODIPINE (NORVASC) 10 MG TABLET    Take 1 tablet (10 mg total) by mouth daily.   BLOOD GLUCOSE MONITORING SUPPL (ONETOUCH VERIO REFLECT) W/DEVICE KIT    Use to check blood sugar daily.   FLUTICASONE (FLONASE) 50 MCG/ACT NASAL SPRAY    Place 2 sprays into both nostrils daily.   GLECAPREVIR-PIBRENTASVIR (MAVYRET) 100-40 MG TABS    Take 3 tablets by mouth daily with breakfast.   GLIMEPIRIDE (AMARYL) 4 MG TABLET    Take 1 tablet (4 mg total) by mouth daily before breakfast.   GLUCOSE BLOOD TEST STRIP    Use as instructed to check blood sugar daily.   LANCETS (ONETOUCH DELICA PLUS [id number]) MISC    Use as instructed to check blood sugar daily.   LISINOPRIL (ZESTRIL) 10 MG TABLET    Take 1 tablet (10 mg total) by mouth daily.   MELOXICAM (MOBIC) 15 MG TABLET    Take 1 tablet (15 mg total) by mouth daily.   PANTOPRAZOLE (PROTONIX) 40 MG TABLET    Take 40 mg by mouth daily.  Modified  Medications   No medications on file  Discontinued Medications   No medications on file    Allergies: Allergies  Allergen Reactions  . Penicillins Swelling and Rash    Did it involve swelling of the face/tongue/throat, SOB, or low BP? y Did it involve sudden or severe rash/hives, skin peeling, or any reaction on the inside of your mouth or nose? y Did you need to seek medical attention at a hospital or doctor's office? y When did it last happen? 2026 If all above answers are "NO", may proceed with cephalosporin use.    Past Medical History: Past Medical History:  Diagnosis Date  . Allergy   . Cataract   . Diabetes mellitus without complication ([place])   . Hypertension   . Low blood potassium   . Sickle cell trait ([place])     Social History: Social History   Socioeconomic History  . Marital status: Single    Spouse name: Not on file  . Number of children: Not on file  . Years of education: Not on file  . Highest education level: Not on file  Occupational History  . Not on file  Tobacco Use  . Smoking status: Never Smoker  . Smokeless tobacco: Never Used  Vaping Use  . Vaping Use: Never used  Substance and Sexual Activity  . Alcohol use: No  . Drug use: No  . Sexual  activity: Not Currently  Other Topics Concern  . Not on file  Social History Narrative  . Not on file   Social Determinants of Health   Financial Resource Strain:   . Difficulty of Paying Living Expenses:   Food Insecurity:   . Worried About Charity fundraiser in the Last Year:   . Arboriculturist in the Last Year:   Transportation Needs:   . Film/video editor (Medical):   Marland Kitchen Lack of Transportation (Non-Medical):   Physical Activity:   . Days of Exercise per Week:   . Minutes of Exercise per Session:   Stress:   . Feeling of Stress :   Social Connections:   . Frequency of Communication with Friends and Family:   . Frequency of Social Gatherings with Friends and Family:   .  Attends Religious Services:   . Active Member of Clubs or Organizations:   . Attends Archivist Meetings:   Marland Kitchen Marital Status:     Labs: Hepatitis C Lab Results  Component Value Date   HCVGENOTYPE 1 05/03/2020   FIBROSTAGE F0 05/03/2020   Hepatitis B Lab Results  Component Value Date   HEPBSAB REACTIVE (A) 05/03/2020   HEPBSAG NON-REACTIVE 05/03/2020   Hepatitis A Lab Results  Component Value Date   HAV NON-REACTIVE 05/03/2020   HIV Lab Results  Component Value Date   HIV Non Reactive 06/09/2019   Lab Results  Component Value Date   CREATININE 0.84 07/02/2020   CREATININE 0.92 06/01/2020   CREATININE 0.78 02/29/2020   CREATININE 0.88 11/25/2019   CREATININE 0.78 09/09/2019   Lab Results  Component Value Date   AST 137 (H) 06/01/2020   AST 139 (H) 02/29/2020   AST 46 (H) 12/19/2019   ALT 209 (H) 06/01/2020   ALT 178 (H) 05/03/2020   ALT 263 (H) 02/29/2020   INR 0.9 05/03/2020   INR 0.9 06/10/2019    Assessment: Maria Robinson is here today for her 4 week Hepatitis C follow up appointment after starting 8 weeks of [place] back in June. She is doing very well so far and having no side effects whatsoever. She hasn't missed a single dose and takes all three tablets together around 12pm with food. She started her 2nd and final month yesterday.  No issues today.  Will check labs, have her come back in another 4 weeks for lab work, and then follow up with me again for her cure visit.   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 [name], PharmD, BCIDP, AAHIVP, CPP Clinical Pharmacist Practitioner Infectious Diseases [facility] 07/12/2020, 9:55 AM

## 2020-07-16 LAB — HM DIABETES EYE EXAM

## 2020-07-18 LAB — COMPREHENSIVE METABOLIC PANEL
AG Ratio: 1.4 (calc) (ref 1.0–2.5)
ALT: 51 U/L — ABNORMAL HIGH (ref 6–29)
AST: 32 U/L (ref 10–35)
Albumin: 4.5 g/dL (ref 3.6–5.1)
Alkaline phosphatase (APISO): 95 U/L (ref 37–153)
BUN: 19 mg/dL (ref 7–25)
CO2: 24 mmol/L (ref 20–32)
Calcium: 9.7 mg/dL (ref 8.6–10.4)
Chloride: 107 mmol/L (ref 98–110)
Creat: 0.77 mg/dL (ref 0.50–0.99)
Globulin: 3.3 g/dL (calc) (ref 1.9–3.7)
Glucose, Bld: 114 mg/dL — ABNORMAL HIGH (ref 65–99)
Potassium: 4.2 mmol/L (ref 3.5–5.3)
Sodium: 138 mmol/L (ref 135–146)
Total Bilirubin: 0.4 mg/dL (ref 0.2–1.2)
Total Protein: 7.8 g/dL (ref 6.1–8.1)

## 2020-07-18 LAB — HEPATITIS C RNA QUANTITATIVE
HCV Quantitative Log: <1.18 Log IU/mL
HCV RNA, PCR, QN: <15 IU/mL

## 2020-07-25 MED FILL — LISINOPRIL 10 MG TABS: 10 | 30 days supply | Qty: 30 | Fill #1

## 2020-07-25 MED FILL — GLIMEPIRIDE 4 MG TABS: 4 | 30 days supply | Qty: 30 | Fill #1

## 2020-08-09 MED FILL — AMLODIPINE BESYLATE 10 MG T: 10 | 90 days supply | Qty: 90 | Fill #3

## 2020-08-09 MED FILL — FLUTICASONE PROP 50 MCG SPR: 50 | 30 days supply | Qty: 16 | Fill #3

## 2020-08-10 ENCOUNTER — Other Ambulatory Visit: Payer: Self-pay

## 2020-08-10 DIAGNOSIS — B182 Chronic viral hepatitis C: Secondary | ICD-10-CM

## 2020-08-16 ENCOUNTER — Other Ambulatory Visit: Payer: Self-pay

## 2020-08-16 ENCOUNTER — Other Ambulatory Visit: Payer: 59

## 2020-08-16 DIAGNOSIS — B182 Chronic viral hepatitis C: Secondary | ICD-10-CM

## 2020-08-19 LAB — COMPLETE METABOLIC PANEL WITH GFR
AG Ratio: 1.4 (calc) (ref 1.0–2.5)
ALT: 24 U/L (ref 6–29)
AST: 23 U/L (ref 10–35)
Albumin: 4.4 g/dL (ref 3.6–5.1)
Alkaline phosphatase (APISO): 81 U/L (ref 37–153)
BUN: 16 mg/dL (ref 7–25)
CO2: 23 mmol/L (ref 20–32)
Calcium: 9.6 mg/dL (ref 8.6–10.4)
Chloride: 103 mmol/L (ref 98–110)
Creat: 0.9 mg/dL (ref 0.50–0.99)
GFR, Est African American: 79 mL/min/{1.73_m2} (ref >=60)
GFR, Est Non African American: 68 mL/min/{1.73_m2} (ref >=60)
Globulin: 3.2 g/dL (calc) (ref 1.9–3.7)
Glucose, Bld: 106 mg/dL — ABNORMAL HIGH (ref 65–99)
Potassium: 4.2 mmol/L (ref 3.5–5.3)
Sodium: 138 mmol/L (ref 135–146)
Total Bilirubin: 0.4 mg/dL (ref 0.2–1.2)
Total Protein: 7.6 g/dL (ref 6.1–8.1)

## 2020-08-19 LAB — HEPATITIS C RNA QUANTITATIVE
HCV RNA, PCR, QN (Log): <1.18 log IU/mL
HCV RNA, PCR, QN: <15 IU/mL

## 2020-08-28 ENCOUNTER — Other Ambulatory Visit: Payer: Self-pay | Admitting: Nurse Practitioner

## 2020-08-28 MED FILL — GLIMEPIRIDE 4 MG TABS: 4 | 30 days supply | Qty: 30 | Fill #0

## 2020-08-28 MED FILL — LISINOPRIL 10 MG TABS: 10 | 30 days supply | Qty: 30 | Fill #2

## 2020-08-28 NOTE — Telephone Encounter (Signed)
Patient would like request expedited glimepiride (AMARYL) 4 MG tablet, patient contacted pharmacy and was advised to reach out to PCP.   [place], [place] [employer] Phone:  [phone]  Fax:  [phone]

## 2020-09-07 ENCOUNTER — Encounter: Payer: Self-pay | Admitting: Nurse Practitioner

## 2020-09-07 ENCOUNTER — Ambulatory Visit: Payer: 59 | Attending: Nurse Practitioner | Admitting: Nurse Practitioner

## 2020-09-07 DIAGNOSIS — I10 Essential (primary) hypertension: Secondary | ICD-10-CM

## 2020-09-07 DIAGNOSIS — E1165 Type 2 diabetes mellitus with hyperglycemia: Secondary | ICD-10-CM | POA: Diagnosis not present

## 2020-09-07 DIAGNOSIS — Z1231 Encounter for screening mammogram for malignant neoplasm of breast: Secondary | ICD-10-CM | POA: Diagnosis not present

## 2020-09-07 DIAGNOSIS — M2550 Pain in unspecified joint: Secondary | ICD-10-CM | POA: Diagnosis not present

## 2020-09-07 MED ORDER — MELOXICAM 15 MG PO TABS: 15.0000 mg | ORAL_TABLET | Freq: Every day | ORAL | 1 refills | Status: DC

## 2020-09-07 MED FILL — MELOXICAM 15 MG TABLET: 15 | 30 days supply | Qty: 30 | Fill #0

## 2020-09-07 NOTE — Progress Notes (Signed)
Virtual Visit via Telephone Note Due to national recommendations of social distancing due to [place] 19, telehealth visit is felt to be most appropriate for this patient at this time.  I discussed the limitations, risks, security and privacy concerns of performing an evaluation and management service by telephone and the availability of in person appointments. I also discussed with the patient that there may be a patient responsible charge related to this service. The patient expressed understanding and agreed to proceed.    I connected with Maria Robinson on 09/07/20  at  10:30 AM EDT  EDT by telephone and verified that I am speaking with the correct person using two identifiers.   Consent I discussed the limitations, risks, security and privacy concerns of performing an evaluation and management service by telephone and the availability of in person appointments. I also discussed with the patient that there may be a patient responsible charge related to this service. The patient expressed understanding and agreed to proceed.   Location of Patient: Private Residence   Location of Provider: [place] and [employer] Office    Persons participating in Telemedicine visit: Maria Vastine FNP-BC [place]    History of Present Illness: Telemedicine visit for: F/U  has a past medical history of Allergy, Cataract, Diabetes mellitus without complication ([place]), Hypertension, Low blood potassium, and Sickle cell trait ([place]).   Patient has been counseled on age-appropriate routine health concerns for screening and prevention. These are reviewed and up-to-date. Referrals have been placed accordingly. Immunizations are up-to-date or declined.    Mammogram: reordered today  She states she is applying for disability. She currently work for a [employer], [employer]. States she has back pain, bilateral leg pain and swelling and plans to quit her job due to her symptoms.  When she  walks long distances it causes her back and legs to hurt. She tries to wear a back brace as well.   DM TYPE 2 Well controlled. Taking amaryl 4 mg daily. Denies any symptoms of hypo or hyperglycemia. Lab Results  Component Value Date   HGBA1C 5.8 (H) 02/29/2020   Lab Results  Component Value Date   LDLCALC 75 06/01/2020    Essential Hypertension Blood pressure reading this morning at home 120/52. Denies chest pain, shortness of breath, palpitations, lightheadedness, dizziness, headaches or BLE edema. She is taking lisinopril 10 mg daily and amlodipine 10 mg daily.  BP Readings from Last 3 Encounters:  07/02/20 (!) 142/78  06/01/20 (!) 153/88  05/03/20 (!) 158/89      Past Medical History:  Diagnosis Date  . Allergy   . Cataract   . Diabetes mellitus without complication ([place])   . Hypertension   . Low blood potassium   . Sickle cell trait [facility])     Past Surgical History:  Procedure Laterality Date  . BIOPSY  06/13/2019   Procedure: BIOPSY;  Surgeon: Jackquline Denmark, MD;  [facility];  Service: Endoscopy;;  . CESAREAN SECTION     x 3  . ESOPHAGOGASTRODUODENOSCOPY (EGD) WITH PROPOFOL N/A 06/13/2019   Procedure: ESOPHAGOGASTRODUODENOSCOPY (EGD) WITH PROPOFOL;  Surgeon: Jackquline Denmark, MD;  [facility];  Service: Endoscopy;  Laterality: N/A;  . HAND SURGERY Right   . MALONEY DILATION  06/13/2019   Procedure: Venia Minks DILATION;  Surgeon: Jackquline Denmark, MD;  Location: [facility];  Service: Endoscopy;;    Family History  Problem Relation Age of Onset  . Diabetes Father   . Colon cancer Neg Hx   .  Colon polyps Neg Hx   . Esophageal cancer Neg Hx   . Rectal cancer Neg Hx   . Stomach cancer Neg Hx     Social History   Socioeconomic History  . Marital status: Single    Spouse name: Not on file  . Number of children: Not on file  . Years of education: Not on file  . Highest education level: Not on file  Occupational History  . Not on file  Tobacco Use   . Smoking status: Never Smoker  . Smokeless tobacco: Never Used  Vaping Use  . Vaping Use: Never used  Substance and Sexual Activity  . Alcohol use: No  . Drug use: No  . Sexual activity: Not Currently  Other Topics Concern  . Not on file  Social History Narrative  . Not on file   Social Determinants of Health   Financial Resource Strain:   . Difficulty of Paying Living Expenses: Not on file  Food Insecurity:   . Worried About Charity fundraiser in the Last Year: Not on file  . Ran Out of Food in the Last Year: Not on file  Transportation Needs:   . Lack of Transportation (Medical): Not on file  . Lack of Transportation (Non-Medical): Not on file  Physical Activity:   . Days of Exercise per Week: Not on file  . Minutes of Exercise per Session: Not on file  Stress:   . Feeling of Stress : Not on file  Social Connections:   . Frequency of Communication with Friends and Family: Not on file  . Frequency of Social Gatherings with Friends and Family: Not on file  . Attends Religious Services: Not on file  . Active Member of Clubs or Organizations: Not on file  . Attends Archivist Meetings: Not on file  . Marital Status: Not on file     Observations/Objective: Awake, alert and oriented x 3   Review of Systems  Constitutional: Negative for fever, malaise/fatigue and weight loss.  HENT: Negative.  Negative for nosebleeds.   Eyes: Negative.  Negative for blurred vision, double vision and photophobia.  Respiratory: Negative.  Negative for cough and shortness of breath.   Cardiovascular: Negative.  Negative for chest pain, palpitations and leg swelling.  Gastrointestinal: Negative.  Negative for heartburn, nausea and vomiting.  Musculoskeletal: Positive for joint pain. Negative for myalgias.  Neurological: Negative.  Negative for dizziness, focal weakness, seizures and headaches.  Psychiatric/Behavioral: Negative.  Negative for suicidal ideas.    Assessment and  Plan: Maria Robinson was seen today for follow-up.  Diagnoses and all orders for this visit:  Essential hypertension Continue all antihypertensives as prescribed.  Remember to bring in your blood pressure log with you for your follow up appointment.  DASH/Mediterranean Diets are healthier choices for HTN.   Breast cancer screening by mammogram -     MM 3D SCREEN BREAST BILATERAL; Future  Type 2 diabetes mellitus with hyperglycemia, without long-term current use of insulin (HCC) Continue blood sugar control as discussed in office today, low carbohydrate diet, and regular physical exercise as tolerated, 150 minutes per week (30 min each day, 5 days per week, or 50 min 3 days per week). Keep blood sugar logs with fasting goal of 90-130 mg/dl, post prandial (after you eat) less than 180.  For Hypoglycemia: BS <60 and Hyperglycemia BS >400; contact the clinic ASAP. Annual eye exams and foot exams are recommended.   Arthralgia of multiple joints -  meloxicam (MOBIC) 15 MG tablet; Take 1 tablet (15 mg total) by mouth daily. Work on losing weight to help reduce joint pain. May alternate with heat and ice application for pain relief.  Other alternatives include massage, acupuncture and water aerobics.  You must stay active and avoid a sedentary lifestyle.     Follow Up Instructions Return in about 3 months (around 12/07/2020).     I discussed the assessment and treatment plan with the patient. The patient was provided an opportunity to ask questions and all were answered. The patient agreed with the plan and demonstrated an understanding of the instructions.   The patient was advised to call back or seek an in-person evaluation if the symptoms worsen or if the condition fails to improve as anticipated.  I provided 17 minutes of non-face-to-face time during this encounter including median intraservice time, reviewing previous notes, labs, imaging, medications and explaining diagnosis and  management.  Gildardo Pounds, FNP-BC

## 2020-09-10 ENCOUNTER — Other Ambulatory Visit: Payer: Self-pay | Admitting: Nurse Practitioner

## 2020-09-10 ENCOUNTER — Other Ambulatory Visit: Payer: Self-pay

## 2020-09-10 ENCOUNTER — Ambulatory Visit: Payer: 59 | Attending: Nurse Practitioner

## 2020-09-10 DIAGNOSIS — E1165 Type 2 diabetes mellitus with hyperglycemia: Secondary | ICD-10-CM

## 2020-09-10 DIAGNOSIS — E559 Vitamin D deficiency, unspecified: Secondary | ICD-10-CM

## 2020-09-11 LAB — HEMOGLOBIN A1C
Est. average glucose Bld gHb Est-mCnc: 128 mg/dL
Hgb A1c MFr Bld: 6.1 % — ABNORMAL HIGH (ref 4.8–5.6)

## 2020-09-11 LAB — VITAMIN D 25 HYDROXY (VIT D DEFICIENCY, FRACTURES): Vit D, 25-Hydroxy: 14.6 ng/mL — ABNORMAL LOW (ref 30.0–100.0)

## 2020-09-14 ENCOUNTER — Other Ambulatory Visit: Payer: Self-pay | Admitting: Nurse Practitioner

## 2020-09-14 MED ORDER — VITAMIN D (ERGOCALCIFEROL) 1.25 MG (50000 UNIT) PO CAPS: 50000.0000 [IU] | ORAL_CAPSULE | ORAL | 1 refills | Status: DC

## 2020-09-14 MED FILL — VIT D2 1.25 MG (50,000 UNIT: 1.25 MG | 84 days supply | Qty: 12 | Fill #0

## 2020-09-24 MED FILL — GLIMEPIRIDE 4 MG TABS: 4 | 30 days supply | Qty: 30 | Fill #1

## 2020-09-24 MED FILL — LISINOPRIL 10 MG TABS: 10 | 30 days supply | Qty: 30 | Fill #3

## 2020-09-26 ENCOUNTER — Ambulatory Visit
Admission: RE | Admit: 2020-09-26 | Discharge: 2020-09-26 | Disposition: A | Discharge status: Home / Self Care | Payer: 59 | Source: Ambulatory Visit | Attending: Nurse Practitioner | Admitting: Nurse Practitioner

## 2020-09-26 ENCOUNTER — Other Ambulatory Visit: Payer: Self-pay

## 2020-09-26 DIAGNOSIS — Z1231 Encounter for screening mammogram for malignant neoplasm of breast: Secondary | ICD-10-CM

## 2020-10-24 ENCOUNTER — Other Ambulatory Visit: Payer: Self-pay | Admitting: Nurse Practitioner

## 2020-10-24 DIAGNOSIS — I10 Essential (primary) hypertension: Secondary | ICD-10-CM

## 2020-10-24 MED FILL — GLIMEPIRIDE 4 MG TABS: 4 | 30 days supply | Qty: 30 | Fill #2

## 2020-10-24 MED FILL — LISINOPRIL 10 MG TABS: 10 | 30 days supply | Qty: 30 | Fill #0

## 2020-10-25 MED FILL — FLUTICASONE PROP 50 MCG SPR: 50 | 30 days supply | Qty: 16 | Fill #4

## 2020-11-09 MED FILL — MELOXICAM 15 MG TABLET: 15 | 30 days supply | Qty: 30 | Fill #1

## 2020-11-09 MED FILL — AMLODIPINE BESYLATE 10 MG T: 10 | 90 days supply | Qty: 90 | Fill #4

## 2020-11-12 ENCOUNTER — Other Ambulatory Visit: Payer: Self-pay

## 2020-11-12 ENCOUNTER — Ambulatory Visit: Payer: 59 | Attending: Nurse Practitioner | Admitting: Nurse Practitioner

## 2020-11-12 ENCOUNTER — Encounter: Payer: Self-pay | Admitting: Nurse Practitioner

## 2020-11-12 ENCOUNTER — Other Ambulatory Visit: Payer: Self-pay | Admitting: Nurse Practitioner

## 2020-11-12 VITALS — BP 155/89 | HR 96 | Temp 97.8°F | Ht 59.0 in | Wt 164.4 lb

## 2020-11-12 DIAGNOSIS — E1165 Type 2 diabetes mellitus with hyperglycemia: Secondary | ICD-10-CM | POA: Diagnosis not present

## 2020-11-12 DIAGNOSIS — E559 Vitamin D deficiency, unspecified: Secondary | ICD-10-CM

## 2020-11-12 DIAGNOSIS — F4321 Adjustment disorder with depressed mood: Secondary | ICD-10-CM | POA: Diagnosis not present

## 2020-11-12 DIAGNOSIS — Z23 Encounter for immunization: Secondary | ICD-10-CM | POA: Diagnosis not present

## 2020-11-12 DIAGNOSIS — J0110 Acute frontal sinusitis, unspecified: Secondary | ICD-10-CM

## 2020-11-12 DIAGNOSIS — J302 Other seasonal allergic rhinitis: Secondary | ICD-10-CM

## 2020-11-12 DIAGNOSIS — F5105 Insomnia due to other mental disorder: Secondary | ICD-10-CM

## 2020-11-12 LAB — GLUCOSE, POCT (MANUAL RESULT ENTRY): POC Glucose: 89 mg/dl (ref 70–99)

## 2020-11-12 MED ORDER — AZELASTINE HCL 0.1 % NA SOLN: 1.0000 | Freq: Two times a day (BID) | NASAL | 12 refills | Status: DC

## 2020-11-12 MED ORDER — GLIMEPIRIDE 4 MG PO TABS: 4.0000 mg | ORAL_TABLET | Freq: Every day | ORAL | 2 refills | Status: DC

## 2020-11-12 MED ORDER — DOXYCYCLINE HYCLATE 100 MG PO TABS: 100.0000 mg | ORAL_TABLET | Freq: Two times a day (BID) | ORAL | 0 refills | Status: DC

## 2020-11-12 MED ORDER — TRAZODONE HCL 100 MG PO TABS: 50.0000 mg | ORAL_TABLET | Freq: Every evening | ORAL | 3 refills | Status: DC | PRN

## 2020-11-12 MED FILL — AZELASTINE HCL 137 MCG SPRY: 0.1 | 30 days supply | Qty: 30 | Fill #0

## 2020-11-12 MED FILL — DOXYCYCLINE HYCLATE 100 MG: 100 | 10 days supply | Qty: 20 | Fill #0

## 2020-11-12 MED FILL — TRAZODONE HCL 100 MG TABS: 100 | 30 days supply | Qty: 30 | Fill #0

## 2020-11-12 NOTE — Progress Notes (Signed)
Assessment & Plan:  Maria Robinson was seen today for hypertension.  Diagnoses and all orders for this visit:  Type 2 diabetes mellitus with hyperglycemia, without long-term current use of insulin (HCC) -     Glucose (CBG) -     glimepiride (AMARYL) 4 MG tablet; Take 1 tablet (4 mg total) by mouth daily before breakfast. -     CMP14+EGFR Continue blood sugar control as discussed in office today, low carbohydrate diet, and regular physical exercise as tolerated, 150 minutes per week (30 min each day, 5 days per week, or 50 min 3 days per week). Keep blood sugar logs with fasting goal of 90-130 mg/dl, post prandial (after you eat) less than 180.  For Hypoglycemia: BS <60 and Hyperglycemia BS >400; contact the clinic ASAP. Annual eye exams and foot exams are recommended.   Vitamin D deficiency disease -     VITAMIN D 25 Hydroxy (Vit-D Deficiency, Fractures)  Need for immunization against influenza -     Flu Vaccine QUAD 36+ mos IM  Insomnia secondary to situational depression -     traZODone (DESYREL) 100 MG tablet; Take 0.5-1 tablets (50-100 mg total) by mouth at bedtime as needed for sleep.  Seasonal allergic rhinitis, unspecified trigger -     azelastine (ASTELIN) 0.1 % nasal spray; Place 1 spray into both nostrils 2 (two) times daily. Use in each nostril as directed  Acute non-recurrent frontal sinusitis -     doxycycline (VIBRA-TABS) 100 MG tablet; Take 1 tablet (100 mg total) by mouth 2 (two) times daily.    Patient has been counseled on age-appropriate routine health concerns for screening and prevention. These are reviewed and up-to-date. Referrals have been placed accordingly. Immunizations are up-to-date or declined.    Subjective:   Chief Complaint  Patient presents with   Hypertension    Pt. is here for blood pressure follow up.   HPI Maria Robinson 64 y.o. female presents to office today for follow up. PMH: [place]renn[place] Allergy, Cataract, DM2 ([place]), Hypertension, and  Sickle cell trait ([place]). She is feeling down today. States her 26 year old son recently passed away unexpectedly. She is having trouble sleeping as well.   Sinusitis: Patient presents with complaints of sinusitis.  Her symptoms began 2 weeks ago and  include nasal congestion, purulent rhinorrhea, facial pain.  She denies fevers, sore throats, periorbital venous congestion, foul rhinorrhea. There has not been a history of chronic otitis media or pharyngotonsillitis.  She is currently taking flonase with no relief of symptoms.  Symptoms have been waxing and waning.   Essential Hypertension Slightly elevated today. She endorses compliance taking amlodipine 10 mg and lisinopril 10 mg daily. Denies chest pain, shortness of breath, palpitations, lightheadedness, dizziness, headaches or BLE edema.  BP Readings from Last 3 Encounters:  11/12/20 (!) 155/89  07/02/20 (!) 142/78  06/01/20 (!) 153/88    DM 2 Well controlled. Taking amaryl 4 mg daily. Statin not recommended with Hepatitis.  Lab Results  Component Value Date   HGBA1C 6.1 (H) 09/10/2020   Lab Results  Component Value Date   LDLCALC 75 06/01/2020    Review of Systems  Constitutional: Negative for fever, malaise/fatigue and weight loss.  HENT: Positive for congestion and sinus pain. Negative for nosebleeds.   Eyes: Negative.  Negative for blurred vision, double vision and photophobia.  Respiratory: Negative.  Negative for cough and shortness of breath.   Cardiovascular: Negative.  Negative for chest pain, palpitations and leg swelling.  Gastrointestinal:  Negative.  Negative for heartburn, nausea and vomiting.  Musculoskeletal: Negative.  Negative for myalgias.  Neurological: Negative.  Negative for dizziness, focal weakness, seizures and headaches.  Endo/Heme/Allergies: Positive for environmental allergies.  Psychiatric/Behavioral: Positive for depression. Negative for suicidal ideas. The patient has insomnia.     Past Medical  History:  Diagnosis Date   Allergy    Cataract    Diabetes mellitus without complication ([place])    Hypertension    Low blood potassium    Sickle cell trait [facility])     Past Surgical History:  Procedure Laterality Date   BIOPSY  06/13/2019   Procedure: BIOPSY;  Surgeon: Jackquline Denmark, MD;  [facility];  Service: Endoscopy;;   CESAREAN SECTION     x 3   ESOPHAGOGASTRODUODENOSCOPY (EGD) WITH PROPOFOL N/A 06/13/2019   Procedure: ESOPHAGOGASTRODUODENOSCOPY (EGD) WITH PROPOFOL;  Surgeon: Jackquline Denmark, MD;  [facility];  Service: Endoscopy;  Laterality: N/A;   HAND SURGERY Right    MALONEY DILATION  06/13/2019   Procedure: MALONEY DILATION;  Surgeon: Jackquline Denmark, MD;  [facility];  Service: Endoscopy;;    Family History  Problem Relation Age of Onset   Diabetes Father    Colon cancer Neg Hx    Colon polyps Neg Hx    Esophageal cancer Neg Hx    Rectal cancer Neg Hx    Stomach cancer Neg Hx     Social History Reviewed with no changes to be made today.   Outpatient Medications Prior to Visit  Medication Sig Dispense Refill   amLODipine (NORVASC) 10 MG tablet Take 1 tablet (10 mg total) by mouth daily. 90 tablet 3   Blood Glucose Monitoring Suppl (ONETOUCH VERIO REFLECT) w/Device KIT Use to check blood sugar daily. 1 kit 0   glucose blood test strip Use as instructed to check blood sugar daily. 100 each 2   Lancets (ONETOUCH DELICA PLUS [id number]) MISC Use as instructed to check blood sugar daily. 100 each 2   lisinopril (ZESTRIL) 10 MG tablet TAKE 1 TABLET (10 MG TOTAL) BY MOUTH DAILY. 30 tablet 1   meloxicam (MOBIC) 15 MG tablet Take 1 tablet (15 mg total) by mouth daily. 30 tablet 1   Vitamin D, Ergocalciferol, (DRISDOL) 1.25 MG (50000 UNIT) CAPS capsule Take 1 capsule (50,000 Units total) by mouth every 7 (seven) days. 12 capsule 1   fluticasone (FLONASE) 50 MCG/ACT nasal spray Place 2 sprays into both nostrils daily. 16 g 6     Glecaprevir-Pibrentasvir (MAVYRET) 100-40 MG TABS Take 3 tablets by mouth daily with breakfast. (Patient not taking: Reported on 09/07/2020) 84 tablet 1   pantoprazole (PROTONIX) 40 MG tablet Take 40 mg by mouth daily. (Patient not taking: Reported on 11/12/2020)     glimepiride (AMARYL) 4 MG tablet TAKE 1 TABLET (4 MG TOTAL) BY MOUTH DAILY BEFORE BREAKFAST. 30 tablet 2   No facility-administered medications prior to visit.    Allergies  Allergen Reactions   Penicillins Swelling and Rash    Did it involve swelling of the face/tongue/throat, SOB, or low BP? y Did it involve sudden or severe rash/hives, skin peeling, or any reaction on the inside of your mouth or nose? y Did you need to seek medical attention at a hospital or doctor's office? y When did it last happen? 2026 If all above answers are NO, may proceed with cephalosporin use.       Objective:    BP (!) 155/89 (BP Location: Left Arm, Patient Position: Sitting,  Cuff Size: Normal)    Pulse 96    Temp 97.8 F (36.6 C) (Temporal)    Ht _0  (1.499 m)    Wt 164 lb 6.4 oz (74.6 kg)    SpO2 98%    BMI 33.20 kg/m  Wt Readings from Last 3 Encounters:  11/12/20 164 lb 6.4 oz (74.6 kg)  06/01/20 165 lb (74.8 kg)  05/03/20 161 lb (73 kg)    Physical Exam Vitals and nursing note reviewed.  Constitutional:      Appearance: She is well-developed.  HENT:     Head: Normocephalic and atraumatic.  Cardiovascular:     Rate and Rhythm: Normal rate and regular rhythm.     Heart sounds: Normal heart sounds. No murmur heard.  No friction rub. No gallop.   Pulmonary:     Effort: Pulmonary effort is normal. No tachypnea or respiratory distress.     Breath sounds: Normal breath sounds. No decreased breath sounds, wheezing, rhonchi or rales.  Chest:     Chest wall: No tenderness.  Abdominal:     General: Bowel sounds are normal.     Palpations: Abdomen is soft.  Musculoskeletal:        General: Normal range of motion.      Cervical back: Normal range of motion.  Skin:    General: Skin is warm and dry.  Neurological:     Mental Status: She is alert and oriented to person, place, and time.     Coordination: Coordination normal.  Psychiatric:        Behavior: Behavior normal. Behavior is cooperative.        Thought Content: Thought content normal.        Judgment: Judgment normal.          Patient has been counseled extensively about nutrition and exercise as well as the importance of adherence with medications and regular follow-up. The patient was given clear instructions to go to ER or return to medical center if symptoms don't improve, worsen or new problems develop. The patient verbalized understanding.   Follow-up: Return in about 3 months (around 02/12/2021).   Gildardo Pounds, FNP-BC [facility] and [place], [place]   11/12/2020, 2:39 PM

## 2020-11-13 LAB — CMP14+EGFR
ALT: 19 IU/L (ref 0–32)
AST: 17 IU/L (ref 0–40)
Albumin/Globulin Ratio: 1.8 (ref 1.2–2.2)
Albumin: 5.1 g/dL — ABNORMAL HIGH (ref 3.8–4.8)
Alkaline Phosphatase: 100 IU/L (ref 44–121)
BUN/Creatinine Ratio: 23 (ref 12–28)
BUN: 17 mg/dL (ref 8–27)
Bilirubin Total: 0.4 mg/dL (ref 0.0–1.2)
CO2: 23 mmol/L (ref 20–29)
Calcium: 9.6 mg/dL (ref 8.7–10.3)
Chloride: 100 mmol/L (ref 96–106)
Creatinine, Ser: 0.74 mg/dL (ref 0.57–1.00)
GFR calc Af Amer: 100 mL/min/{1.73_m2} (ref >59)
GFR calc non Af Amer: 86 mL/min/{1.73_m2} (ref >59)
Globulin, Total: 2.9 g/dL (ref 1.5–4.5)
Glucose: 55 mg/dL — ABNORMAL LOW (ref 65–99)
Potassium: 4.8 mmol/L (ref 3.5–5.2)
Sodium: 139 mmol/L (ref 134–144)
Total Protein: 8 g/dL (ref 6.0–8.5)

## 2020-11-13 LAB — VITAMIN D 25 HYDROXY (VIT D DEFICIENCY, FRACTURES): Vit D, 25-Hydroxy: 36 ng/mL (ref 30.0–100.0)

## 2020-11-20 MED FILL — GLIMEPIRIDE 4 MG TABS: 4 | 30 days supply | Qty: 30 | Fill #0

## 2020-11-21 ENCOUNTER — Ambulatory Visit (INDEPENDENT_AMBULATORY_CARE_PROVIDER_SITE_OTHER): Payer: 59 | Admitting: Pharmacist

## 2020-11-21 ENCOUNTER — Other Ambulatory Visit: Payer: Self-pay

## 2020-11-21 DIAGNOSIS — B182 Chronic viral hepatitis C: Secondary | ICD-10-CM

## 2020-11-21 NOTE — Progress Notes (Signed)
HPI: Maria Robinson is a 64 y.o. female who presents to the [facility] clinic for Hepatitis C follow-up.  Medication: Mavyret x 8 weeks  Start Date: ~06/15/20  Hepatitis C Genotype: 1  Fibrosis Score: F0  Hepatitis C RNA: 364,000 on 03/13/20; <15 on 07/12/20 and 08/16/20  Patient Active Problem List   Diagnosis Date Noted  . Chronic hepatitis C with hepatic coma ([place]) 05/03/2020  . PTTD (posterior tibial tendon dysfunction) 04/20/2020  . Pes planus 04/20/2020  . Microcytic anemia 06/22/2019  . Esophageal dysphagia   . Lower abdominal pain   . DKA (diabetic ketoacidoses) 06/09/2019  . DM (diabetes mellitus) ([place]) 06/09/2019  . AKI (acute kidney injury) ([place]) 06/09/2019  . Elevated liver enzymes 06/09/2019  . Hyponatremia 06/09/2019  . Muscle weakness (generalized) 06/09/2019  . Essential hypertension 05/09/2019  . Seasonal allergies 05/09/2019  . Uninsured 05/06/2019  . De Quervain's disease (tenosynovitis) 10/05/2017    Patient's Medications  New Prescriptions   No medications on file  Previous Medications   AMLODIPINE (NORVASC) 10 MG TABLET    Take 1 tablet (10 mg total) by mouth daily.   AZELASTINE (ASTELIN) 0.1 % NASAL SPRAY    Place 1 spray into both nostrils 2 (two) times daily. Use in each nostril as directed   BLOOD GLUCOSE MONITORING SUPPL (ONETOUCH VERIO REFLECT) W/DEVICE KIT    Use to check blood sugar daily.   DOXYCYCLINE (VIBRA-TABS) 100 MG TABLET    Take 1 tablet (100 mg total) by mouth 2 (two) times daily.   GLIMEPIRIDE (AMARYL) 4 MG TABLET    Take 1 tablet (4 mg total) by mouth daily before breakfast.   GLUCOSE BLOOD TEST STRIP    Use as instructed to check blood sugar daily.   LANCETS (ONETOUCH DELICA PLUS [id number]) MISC    Use as instructed to check blood sugar daily.   LISINOPRIL (ZESTRIL) 10 MG TABLET    TAKE 1 TABLET (10 MG TOTAL) BY MOUTH DAILY.   MELOXICAM (MOBIC) 15 MG TABLET    Take 1 tablet (15 mg total) by mouth daily.   PANTOPRAZOLE  (PROTONIX) 40 MG TABLET    Take 40 mg by mouth daily.   TRAZODONE (DESYREL) 100 MG TABLET    Take 0.5-1 tablets (50-100 mg total) by mouth at bedtime as needed for sleep.   VITAMIN D, ERGOCALCIFEROL, (DRISDOL) 1.25 MG (50000 UNIT) CAPS CAPSULE    Take 1 capsule (50,000 Units total) by mouth every 7 (seven) days.  Modified Medications   No medications on file  Discontinued Medications   GLECAPREVIR-PIBRENTASVIR (MAVYRET) 100-40 MG TABS    Take 3 tablets by mouth daily with breakfast.    Allergies: Allergies  Allergen Reactions  . Penicillins Swelling and Rash    Did it involve swelling of the face/tongue/throat, SOB, or low BP? y Did it involve sudden or severe rash/hives, skin peeling, or any reaction on the inside of your mouth or nose? y Did you need to seek medical attention at a hospital or doctor's office? y When did it last happen? 2026 If all above answers are "NO", may proceed with cephalosporin use.    Past Medical History: Past Medical History:  Diagnosis Date  . Allergy   . Cataract   . Diabetes mellitus without complication ([place])   . Hypertension   . Low blood potassium   . Sickle cell trait ([place])     Social History: Social History   Socioeconomic History  . Marital status: Single  Spouse name: Not on file  . Number of children: Not on file  . Years of education: Not on file  . Highest education level: Not on file  Occupational History  . Not on file  Tobacco Use  . Smoking status: Never Smoker  . Smokeless tobacco: Never Used  Vaping Use  . Vaping Use: Never used  Substance and Sexual Activity  . Alcohol use: No  . Drug use: No  . Sexual activity: Not Currently  Other Topics Concern  . Not on file  Social History Narrative  . Not on file   Social Determinants of Health   Financial Resource Strain:   . Difficulty of Paying Living Expenses: Not on file  Food Insecurity:   . Worried About Charity fundraiser in the Last Year: Not on file    . Ran Out of Food in the Last Year: Not on file  Transportation Needs:   . Lack of Transportation (Medical): Not on file  . Lack of Transportation (Non-Medical): Not on file  Physical Activity:   . Days of Exercise per Week: Not on file  . Minutes of Exercise per Session: Not on file  Stress:   . Feeling of Stress : Not on file  Social Connections:   . Frequency of Communication with Friends and Family: Not on file  . Frequency of Social Gatherings with Friends and Family: Not on file  . Attends Religious Services: Not on file  . Active Member of Clubs or Organizations: Not on file  . Attends Archivist Meetings: Not on file  . Marital Status: Not on file    Labs: Hepatitis C Lab Results  Component Value Date   HCVGENOTYPE 1 05/03/2020   HCVRNAPCRQN <15 08/16/2020   HCVRNAPCRQN <15 NOT DETECTED 07/12/2020   FIBROSTAGE F0 05/03/2020   Hepatitis B Lab Results  Component Value Date   HEPBSAB REACTIVE (A) 05/03/2020   HEPBSAG NON-REACTIVE 05/03/2020   Hepatitis A Lab Results  Component Value Date   HAV NON-REACTIVE 05/03/2020   HIV Lab Results  Component Value Date   HIV Non Reactive 06/09/2019   Lab Results  Component Value Date   CREATININE 0.74 11/12/2020   CREATININE 0.90 08/16/2020   CREATININE 0.77 07/12/2020   CREATININE 0.84 07/02/2020   CREATININE 0.92 06/01/2020   Lab Results  Component Value Date   AST 17 11/12/2020   AST 23 08/16/2020   AST 32 07/12/2020   ALT 19 11/12/2020   ALT 24 08/16/2020   ALT 51 (H) 07/12/2020   INR 0.9 05/03/2020   INR 0.9 06/10/2019    Assessment: Maria Robinson is here today for her Hepatitis C SVR12 cure visit. She finished Mavyret back at the end of August without any issues. Her early on treatment and end of treatment Hep C RNAs were undetectable. Discussed with her that her Hep C antibody will be positive lifelong. Answered all questions. Will order RNA today for cure. No follow up needed with [facility].   Plan: -  Hep C RNA today  Jaquelin Meaney L. Jerome Viglione, PharmD, BCIDP, AAHIVP, CPP Clinical Pharmacist Practitioner Infectious Diseases [place] 11/21/2020, 3:39 PM

## 2020-11-23 LAB — HEPATITIS C RNA QUANTITATIVE
HCV Quantitative Log: <1.18 log IU/mL
HCV RNA, PCR, QN: <15 IU/mL

## 2020-11-27 MED FILL — LISINOPRIL 10 MG TABS: 10 | 30 days supply | Qty: 30 | Fill #1

## 2020-12-06 MED FILL — VIT D2 1.25 MG (50,000 UNIT: 1.25 MG | 84 days supply | Qty: 12 | Fill #1

## 2020-12-24 ENCOUNTER — Other Ambulatory Visit: Payer: Self-pay | Admitting: Nurse Practitioner

## 2020-12-24 DIAGNOSIS — I10 Essential (primary) hypertension: Secondary | ICD-10-CM

## 2020-12-24 MED ORDER — LISINOPRIL 10 MG PO TABS: 10.0000 mg | ORAL_TABLET | Freq: Every day | ORAL | 0 refills | Status: DC

## 2020-12-24 NOTE — Telephone Encounter (Signed)
Medication: lisinopril (ZESTRIL) 10 MG tablet [[id number]]  ENDED last OV 11/12/20  Has the patient contacted their pharmacy? YES  (Agent: If no, request that the patient contact the pharmacy for the refill.) (Agent: If yes, when and what did the pharmacy advise?)  Preferred Pharmacy (with phone number or street name): [facility] [address] [place], [place] [address]  Agent: Please be advised that RX refills may take up to 3 business days. We ask that you follow-up with your pharmacy.

## 2021-01-17 ENCOUNTER — Other Ambulatory Visit: Payer: Self-pay | Admitting: Nurse Practitioner

## 2021-01-17 DIAGNOSIS — I10 Essential (primary) hypertension: Secondary | ICD-10-CM

## 2021-01-17 NOTE — Telephone Encounter (Signed)
Requested Prescriptions  Pending Prescriptions Disp Refills  . lisinopril (ZESTRIL) 10 MG tablet [Pharmacy Med Name: LISINOPRIL 10 MG TABLET] 30 tablet 2    Sig: TAKE 1 TABLET BY MOUTH EVERY DAY     Cardiovascular:  ACE Inhibitors Failed - 01/17/2021  3:06 AM      Failed - Last BP in normal range    BP Readings from Last 1 Encounters:  11/12/20 (!) 155/89         Passed - Cr in normal range and within 180 days    Creat  Date Value Ref Range Status  08/16/2020 0.90 0.50 - 0.99 mg/dL Final    Comment:    For patients >105 years of age, the reference limit for Creatinine is approximately 13% higher for people identified as African-American. .    Creatinine, Ser  Date Value Ref Range Status  11/12/2020 0.74 0.57 - 1.00 mg/dL Final         Passed - K in normal range and within 180 days    Potassium  Date Value Ref Range Status  11/12/2020 4.8 3.5 - 5.2 mmol/L Final         Passed - Patient is not pregnant      Passed - Valid encounter within last 6 months    Recent Outpatient Visits          2 months ago Type 2 diabetes mellitus with hyperglycemia, without long-term current use of insulin ([place])   [place], Vernia Buff, NP   4 months ago Essential hypertension   [place], [place] W, NP   6 months ago Essential hypertension   [place], Annie Main L, RPH-CPP   7 months ago Type 2 diabetes mellitus with hyperglycemia, without long-term current use of insulin [facility])   [place], [place] W, NP   10 months ago Type 2 diabetes mellitus with hyperglycemia, without long-term current use of insulin [facility])   [place], Vernia Buff, NP      Future Appointments            In 3 weeks Gildardo Pounds, NP [place]

## 2021-01-24 ENCOUNTER — Other Ambulatory Visit: Payer: Self-pay | Admitting: Nurse Practitioner

## 2021-01-24 DIAGNOSIS — F5105 Insomnia due to other mental disorder: Secondary | ICD-10-CM

## 2021-01-24 DIAGNOSIS — E1165 Type 2 diabetes mellitus with hyperglycemia: Secondary | ICD-10-CM

## 2021-01-24 DIAGNOSIS — F4321 Adjustment disorder with depressed mood: Secondary | ICD-10-CM

## 2021-01-24 MED ORDER — GLIMEPIRIDE 4 MG PO TABS: 4.0000 mg | ORAL_TABLET | Freq: Every day | ORAL | 2 refills | Status: DC

## 2021-01-24 MED ORDER — AMLODIPINE BESYLATE 10 MG PO TABS
10.0000 mg | ORAL_TABLET | Freq: Every day | ORAL | 3 refills | Status: DC
Start: 2021-01-24 — End: 2021-03-23

## 2021-01-24 MED ORDER — TRAZODONE HCL 100 MG PO TABS: 50.0000 mg | ORAL_TABLET | Freq: Every evening | ORAL | 3 refills | Status: DC | PRN

## 2021-01-24 NOTE — Telephone Encounter (Signed)
Medication Refill - Medication: glimepiride (AMARYL) 4 MG tablet   Pt is completely out of this Rx, she is also requesting to [facility] all [facility]   Has the patient contacted their pharmacy? Yes.   (Agent: If no, request that the patient contact the pharmacy for the refill.) (Agent: If yes, when and what did the pharmacy advise?)  Preferred Pharmacy (with phone number or street name):  [id number] [place] [place] [facility] [address]  [place]  [address]  Phone: [phone] Fax: [phone]     Agent: Please be advised that RX refills may take up to 3 business days. We ask that you follow-up with your pharmacy.

## 2021-01-24 NOTE — Telephone Encounter (Signed)
Requested Prescriptions  Pending Prescriptions Disp Refills  . glimepiride (AMARYL) 4 MG tablet 30 tablet 2    Sig: Take 1 tablet (4 mg total) by mouth daily before breakfast.     Endocrinology:  Diabetes - Sulfonylureas Passed - 01/24/2021 10:43 AM      Passed - HBA1C is between 0 and 7.9 and within 180 days    Hgb A1c MFr Bld  Date Value Ref Range Status  09/10/2020 6.1 (H) 4.8 - 5.6 % Final    Comment:             Prediabetes: 5.7 - 6.4          Diabetes: >6.4          Glycemic control for adults with diabetes: <7.0          Passed - Valid encounter within last 6 months    Recent Outpatient Visits          2 months ago Type 2 diabetes mellitus with hyperglycemia, without long-term current use of insulin ([place])   [place], Vernia Buff, NP   4 months ago Essential hypertension   [place], [place] W, NP   6 months ago Essential hypertension   [place], Annie Main L, RPH-CPP   7 months ago Type 2 diabetes mellitus with hyperglycemia, without long-term current use of insulin [facility])   [place], [place] W, NP   11 months ago Type 2 diabetes mellitus with hyperglycemia, without long-term current use of insulin [facility])   [place], Vernia Buff, NP      Future Appointments            In 2 weeks Gildardo Pounds, NP [place]           . amLODipine (NORVASC) 10 MG tablet 90 tablet 3    Sig: Take 1 tablet (10 mg total) by mouth daily.     Cardiovascular:  Calcium Channel Blockers Failed - 01/24/2021 10:43 AM      Failed - Last BP in normal range    BP Readings from Last 1 Encounters:  11/12/20 (!) 155/89         Passed - Valid encounter within last 6 months    Recent Outpatient Visits          2 months ago Type 2 diabetes mellitus with hyperglycemia, without  long-term current use of insulin ([place])   [place], Vernia Buff, NP   4 months ago Essential hypertension   [place], Vernia Buff, NP   6 months ago Essential hypertension   [place], Annie Main L, RPH-CPP   7 months ago Type 2 diabetes mellitus with hyperglycemia, without long-term current use of insulin [facility])   [place], [place] W, NP   11 months ago Type 2 diabetes mellitus with hyperglycemia, without long-term current use of insulin [facility])   [place], Vernia Buff, NP      Future Appointments            In 2 weeks Gildardo Pounds, NP [place]           . traZODone (DESYREL) 100 MG tablet 30  tablet 3    Sig: Take 0.5-1 tablets (50-100 mg total) by mouth at bedtime as needed for sleep.     Psychiatry: Antidepressants - Serotonin Modulator Passed - 01/24/2021 10:43 AM      Passed - Valid encounter within last 6 months    Recent Outpatient Visits          2 months ago Type 2 diabetes mellitus with hyperglycemia, without long-term current use of insulin [facility])   [place], Vernia Buff, NP   4 months ago Essential hypertension   [place], Vernia Buff, NP   6 months ago Essential hypertension   [place], Annie Main L, RPH-CPP   7 months ago Type 2 diabetes mellitus with hyperglycemia, without long-term current use of insulin [facility])   [place], [place] W, NP   11 months ago Type 2 diabetes mellitus with hyperglycemia, without long-term current use of insulin [facility]   [place], Vernia Buff, NP      Future Appointments            In 2 weeks Gildardo Pounds, NP [place]

## 2021-02-12 ENCOUNTER — Encounter: Payer: Self-pay | Admitting: Nurse Practitioner

## 2021-02-12 ENCOUNTER — Ambulatory Visit: Payer: 59 | Attending: Nurse Practitioner | Admitting: Nurse Practitioner

## 2021-02-12 ENCOUNTER — Other Ambulatory Visit: Payer: Self-pay

## 2021-02-12 DIAGNOSIS — E785 Hyperlipidemia, unspecified: Secondary | ICD-10-CM | POA: Diagnosis not present

## 2021-02-12 DIAGNOSIS — E1165 Type 2 diabetes mellitus with hyperglycemia: Secondary | ICD-10-CM

## 2021-02-12 DIAGNOSIS — I10 Essential (primary) hypertension: Secondary | ICD-10-CM | POA: Diagnosis not present

## 2021-02-12 MED ORDER — ATORVASTATIN CALCIUM 20 MG PO TABS: 20.0000 mg | ORAL_TABLET | Freq: Every day | ORAL | 3 refills | Status: DC

## 2021-02-12 MED ORDER — ONETOUCH DELICA PLUS LANCET33G MISC: 2 refills | Status: DC

## 2021-02-12 MED ORDER — GLUCOSE BLOOD VI STRP: ORAL_STRIP | 2 refills | Status: DC

## 2021-02-12 MED ORDER — LISINOPRIL 20 MG PO TABS: 20.0000 mg | ORAL_TABLET | Freq: Every day | ORAL | 0 refills | Status: DC

## 2021-02-12 NOTE — Progress Notes (Signed)
Virtual Visit via Telephone Note Due to national recommendations of social distancing due to [place] 19, telehealth visit is felt to be most appropriate for this patient at this time.  I discussed the limitations, risks, security and privacy concerns of performing an evaluation and management service by telephone and the availability of in person appointments. I also discussed with the patient that there may be a patient responsible charge related to this service. The patient expressed understanding and agreed to proceed.    I connected with Nura Emberton on 02/12/21  at   9:10 AM EST  EDT by telephone and verified that I am speaking with the correct person using two identifiers.   Consent I discussed the limitations, risks, security and privacy concerns of performing an evaluation and management service by telephone and the availability of in person appointments. I also discussed with the patient that there may be a patient responsible charge related to this service. The patient expressed understanding and agreed to proceed.   Location of Patient: Private Residence   Location of Provider: [place] and [employer] Office    Persons participating in Telemedicine visit: Alayasia Breeding FNP-BC [place]    History of Present Illness: Telemedicine visit for: Follow up She has a past medical history of Seasonal allergy, Cataracts, DM2, HTN, Hypokalemia, and Sickle cell trait   She has complaints today of persistant sinus congestion and dry nasal passages. I have recommended she try a saline spray.   Essential Hypertension Last blood pressure reading at home was 121/98.  States systolic reading as high as 140s at home.  She is currently taking amlodipine 10 mg daily and lisinopril 10 mg daily.  I am increasing lisinopril to 20 mg today for better blood pressure control. Denies chest pain, shortness of breath, palpitations, lightheadedness, dizziness, headaches or  BLE edema.  BP Readings from Last 3 Encounters:  11/12/20 (!) 155/89  07/02/20 (!) 142/78  06/01/20 (!) 153/88   DM Well-controlled with glimepiride 4 mg daily.  She reports average readings: 120-123 and denies any hypo or hyperglycemic symptoms.  Started on atorvastatin 20 mg today. Lab Results  Component Value Date   HGBA1C 6.1 (H) 09/10/2020   Lab Results  Component Value Date   LDLCALC 75 06/01/2020  The 10-year ASCVD risk score Mikey Bussing DC Jr., et al., 2013) is: 24.8%   Values used to calculate the score:     Age: 65 years     Sex: Female     Is Non-Hispanic African American: Yes     Diabetic: Yes     Tobacco smoker: No     Systolic Blood Pressure: 381 mmHg     Is BP treated: Yes     HDL Cholesterol: 68 mg/dL     Total Cholesterol: 171 mg/dL Past Medical History:  Diagnosis Date  . Allergy   . Cataract   . Diabetes mellitus without complication ([place])   . Hypertension   . Low blood potassium   . Sickle cell trait [facility])     Past Surgical History:  Procedure Laterality Date  . BIOPSY  06/13/2019   Procedure: BIOPSY;  Surgeon: Jackquline Denmark, MD;  [facility]: [facility];  Service: Endoscopy;;  . CESAREAN SECTION     x 3  . ESOPHAGOGASTRODUODENOSCOPY (EGD) WITH PROPOFOL N/A 06/13/2019   Procedure: ESOPHAGOGASTRODUODENOSCOPY (EGD) WITH PROPOFOL;  Surgeon: Jackquline Denmark, MD;  [facility];  Service: Endoscopy;  Laterality: N/A;  . HAND SURGERY Right   . MALONEY  DILATION  06/13/2019   Procedure: Venia Minks DILATION;  Surgeon: Jackquline Denmark, MD;  [facility];  Service: Endoscopy;;    Family History  Problem Relation Age of Onset  . Diabetes Father   . Colon cancer Neg Hx   . Colon polyps Neg Hx   . Esophageal cancer Neg Hx   . Rectal cancer Neg Hx   . Stomach cancer Neg Hx     Social History   Socioeconomic History  . Marital status: Single    Spouse name: Not on file  . Number of children: Not on file  . Years of education: Not on file  . Highest  education level: Not on file  Occupational History  . Not on file  Tobacco Use  . Smoking status: Never Smoker  . Smokeless tobacco: Never Used  Vaping Use  . Vaping Use: Never used  Substance and Sexual Activity  . Alcohol use: No  . Drug use: No  . Sexual activity: Not Currently  Other Topics Concern  . Not on file  Social History Narrative  . Not on file   Social Determinants of Health   Financial Resource Strain: Not on file  Food Insecurity: Not on file  Transportation Needs: Not on file  Physical Activity: Not on file  Stress: Not on file  Social Connections: Not on file     Observations/Objective: Awake, alert and oriented x 3   Review of Systems  Constitutional: Negative for fever, malaise/fatigue and weight loss.  HENT: Positive for congestion and nosebleeds. Negative for ear discharge, ear pain, hearing loss, sinus pain, sore throat and tinnitus.   Eyes: Negative.  Negative for blurred vision, double vision and photophobia.  Respiratory: Negative.  Negative for cough, shortness of breath and stridor.   Cardiovascular: Negative.  Negative for chest pain, palpitations and leg swelling.  Gastrointestinal: Negative.  Negative for heartburn, nausea and vomiting.  Musculoskeletal: Negative.  Negative for myalgias.  Neurological: Negative.  Negative for dizziness, focal weakness, seizures and headaches.  Endo/Heme/Allergies: Positive for environmental allergies.  Psychiatric/Behavioral: Negative.  Negative for suicidal ideas.    Assessment and Plan: Hudsyn was seen today for follow-up.  Diagnoses and all orders for this visit:  Type 2 diabetes mellitus with hyperglycemia, without long-term current use of insulin (HCC) -     glucose blood test strip; Use as instructed to check blood sugar daily. -     Lancets (ONETOUCH DELICA PLUS [id number]) MISC; Use as instructed to check blood sugar daily. Continue blood sugar control as discussed in office today, low  carbohydrate diet, and regular physical exercise as tolerated, 150 minutes per week (30 min each day, 5 days per week, or 50 min 3 days per week). Keep blood sugar logs with fasting goal of 90-130 mg/dl, post prandial (after you eat) less than 180.  For Hypoglycemia: BS <60 and Hyperglycemia BS >400; contact the clinic ASAP. Annual eye exams and foot exams are recommended.   Essential hypertension -     lisinopril (ZESTRIL) 20 MG tablet; Take 1 tablet (20 mg total) by mouth daily. Continue all antihypertensives as prescribed.  Remember to bring in your blood pressure log with you for your follow up appointment.  DASH/Mediterranean Diets are healthier choices for HTN.   Dyslipidemia, goal LDL below 70 -     atorvastatin (LIPITOR) 20 MG tablet; Take 1 tablet (20 mg total) by mouth daily. INSTRUCTIONS: Work on a low fat, heart healthy diet and participate in regular aerobic exercise program  by working out at least 150 minutes per week; 5 days a week-30 minutes per day. Avoid red meat/beef/steak,  fried foods. junk foods, sodas, sugary drinks, unhealthy snacking, alcohol and smoking.  Drink at least 80 oz of water per day and monitor your carbohydrate intake daily.     Follow Up Instructions Return in about 3 months (around 05/12/2021).     I discussed the assessment and treatment plan with the patient. The patient was provided an opportunity to ask questions and all were answered. The patient agreed with the plan and demonstrated an understanding of the instructions.   The patient was advised to call back or seek an in-person evaluation if the symptoms worsen or if the condition fails to improve as anticipated.  I provided 16 minutes of non-face-to-face time during this encounter including median intraservice time, reviewing previous notes, labs, imaging, medications and explaining diagnosis and management.  Gildardo Pounds, FNP-BC

## 2021-03-08 ENCOUNTER — Other Ambulatory Visit: Payer: Self-pay | Admitting: Nurse Practitioner

## 2021-03-08 ENCOUNTER — Other Ambulatory Visit: Payer: Self-pay

## 2021-03-08 ENCOUNTER — Ambulatory Visit: Payer: 59 | Attending: Nurse Practitioner

## 2021-03-08 DIAGNOSIS — I10 Essential (primary) hypertension: Secondary | ICD-10-CM

## 2021-03-08 DIAGNOSIS — E785 Hyperlipidemia, unspecified: Secondary | ICD-10-CM

## 2021-03-08 DIAGNOSIS — E1165 Type 2 diabetes mellitus with hyperglycemia: Secondary | ICD-10-CM

## 2021-03-09 ENCOUNTER — Other Ambulatory Visit: Payer: Self-pay | Admitting: Nurse Practitioner

## 2021-03-09 LAB — LIPID PANEL
Chol/HDL Ratio: 2.8 ratio (ref 0.0–4.4)
Cholesterol, Total: 172 mg/dL (ref 100–199)
HDL: 62 mg/dL (ref >39)
LDL Chol Calc (NIH): 93 mg/dL (ref 0–99)
Triglycerides: 95 mg/dL (ref 0–149)
VLDL Cholesterol Cal: 17 mg/dL (ref 5–40)

## 2021-03-09 LAB — CMP14+EGFR
ALT: 25 IU/L (ref 0–32)
AST: 23 IU/L (ref 0–40)
Albumin/Globulin Ratio: 1.6 (ref 1.2–2.2)
Albumin: 4.7 g/dL (ref 3.8–4.8)
Alkaline Phosphatase: 109 IU/L (ref 44–121)
BUN/Creatinine Ratio: 18 (ref 12–28)
BUN: 15 mg/dL (ref 8–27)
Bilirubin Total: 0.4 mg/dL (ref 0.0–1.2)
CO2: 20 mmol/L (ref 20–29)
Calcium: 9.6 mg/dL (ref 8.7–10.3)
Chloride: 99 mmol/L (ref 96–106)
Creatinine, Ser: 0.84 mg/dL (ref 0.57–1.00)
Globulin, Total: 3 g/dL (ref 1.5–4.5)
Glucose: 112 mg/dL — ABNORMAL HIGH (ref 65–99)
Potassium: 4.4 mmol/L (ref 3.5–5.2)
Sodium: 138 mmol/L (ref 134–144)
Total Protein: 7.7 g/dL (ref 6.0–8.5)
eGFR: 78 mL/min/{1.73_m2} (ref >59)

## 2021-03-09 LAB — HEMOGLOBIN A1C
Est. average glucose Bld gHb Est-mCnc: 169 mg/dL
Hgb A1c MFr Bld: 7.5 % — ABNORMAL HIGH (ref 4.8–5.6)

## 2021-03-09 IMAGING — US ULTRASOUND ABDOMEN LIMITED
1 series · 14 of 25 positions shown · non-contrast
Comparison: CT abdomen pelvis 06/09/2019

CLINICAL DATA: Elevated LFT

EXAM:
ULTRASOUND ABDOMEN LIMITED RIGHT UPPER QUADRANT

[Series 1: ultrasound abdomen limited · 14 of 84 slices shown]
[im 1/84]
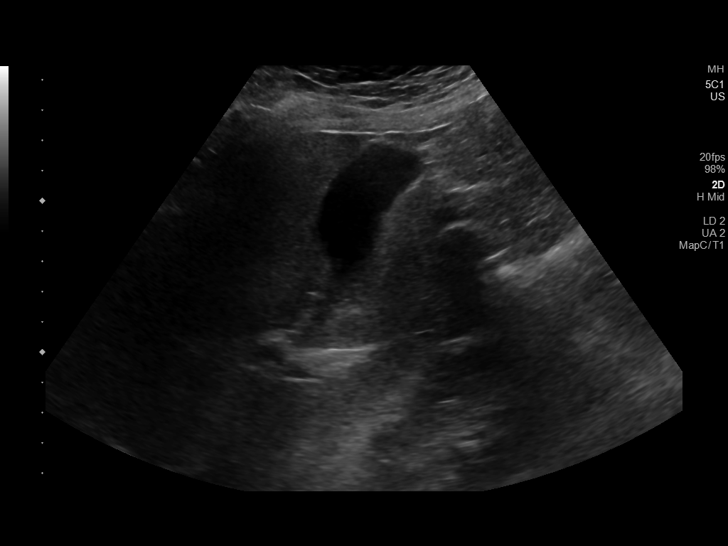
[im 7/84]
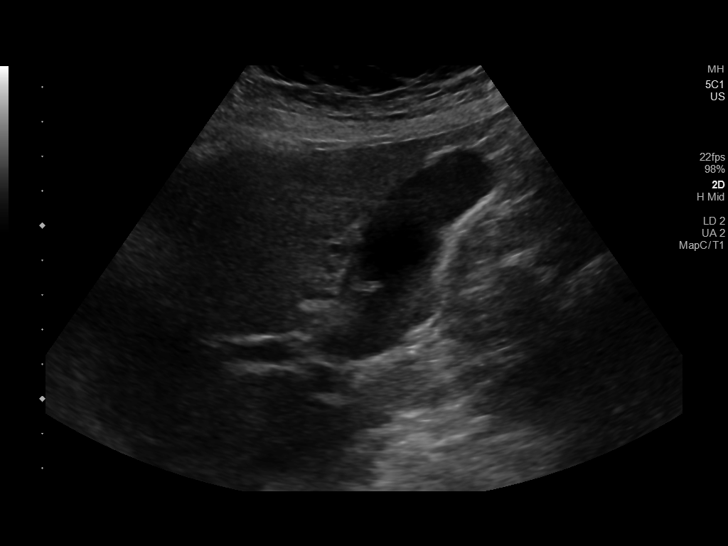
[im 14/84]
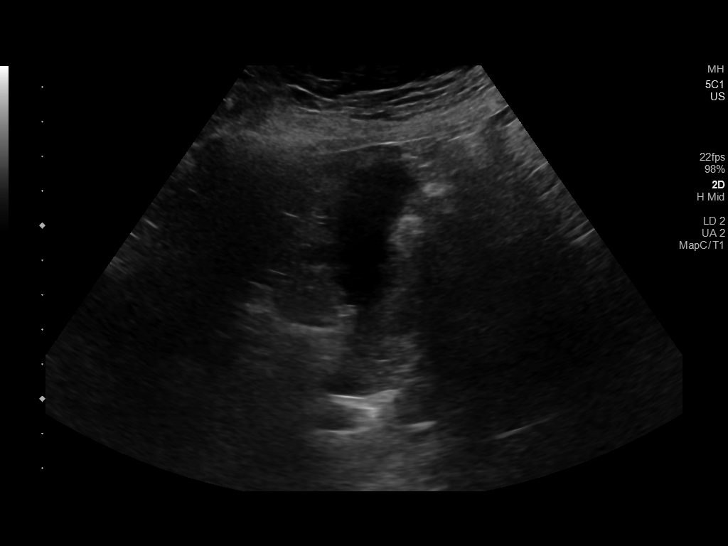
[im 21/84]
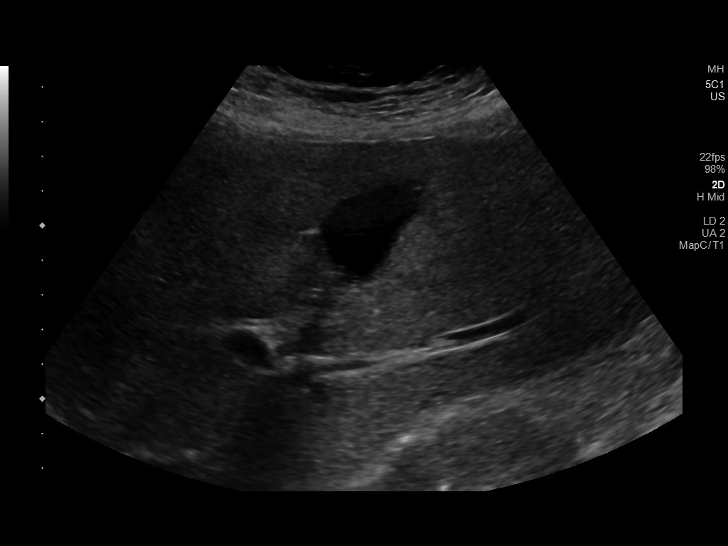
[im 28/84]
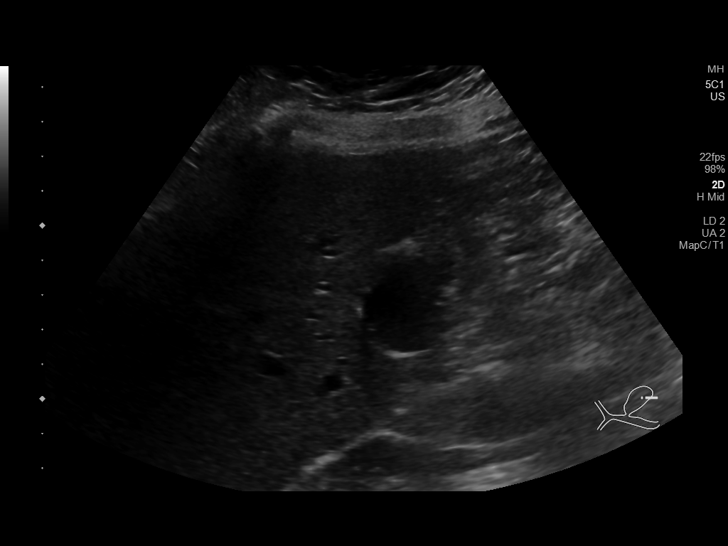
[im 32/84]
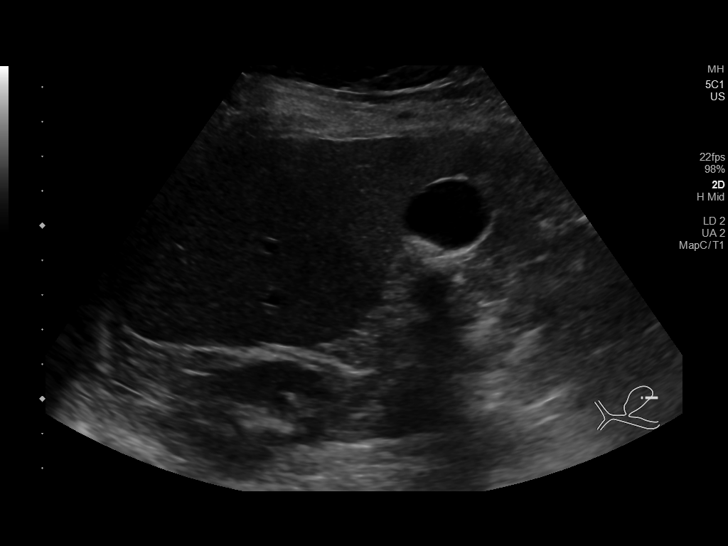
[im 39/84]
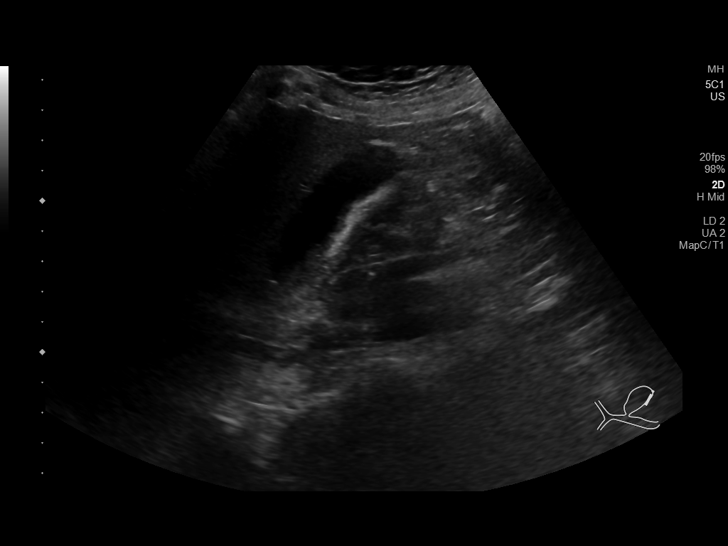
[im 45/84]
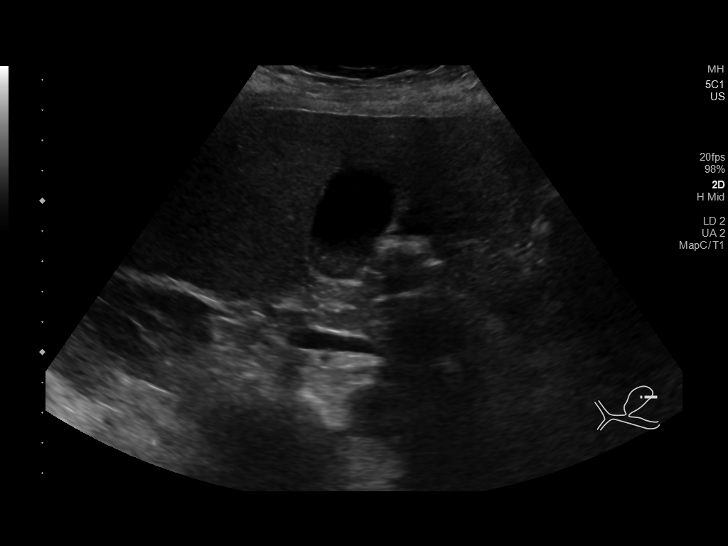
[im 52/84]
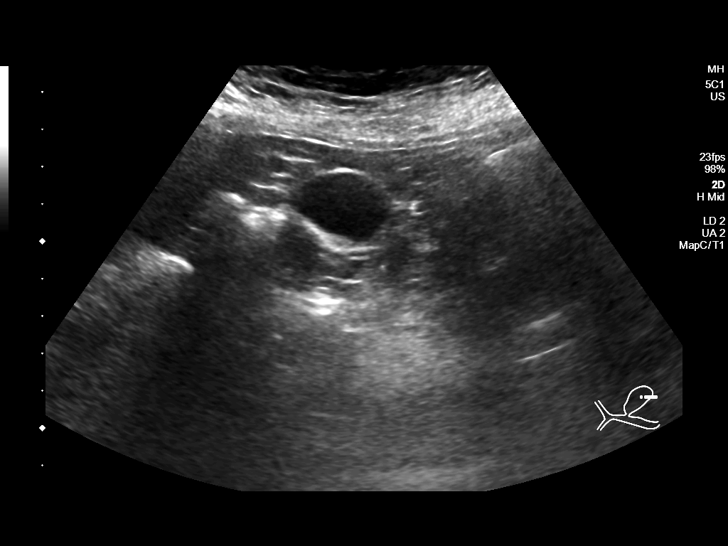
[im 56/84]
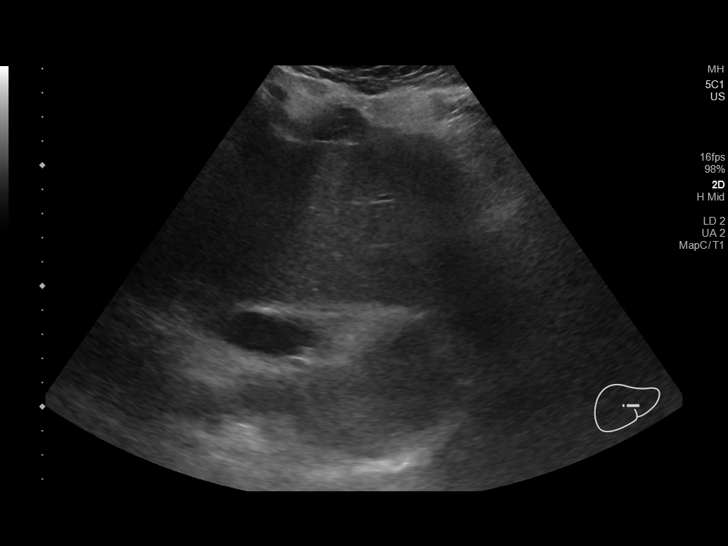
[im 63/84]
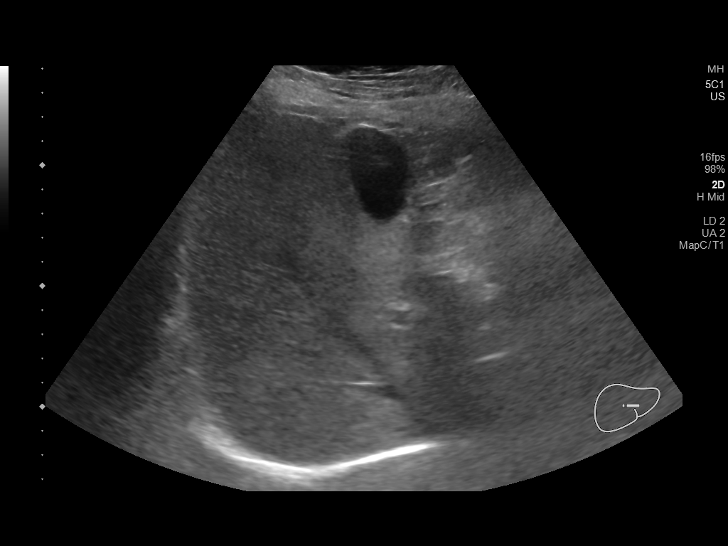
[im 70/84]
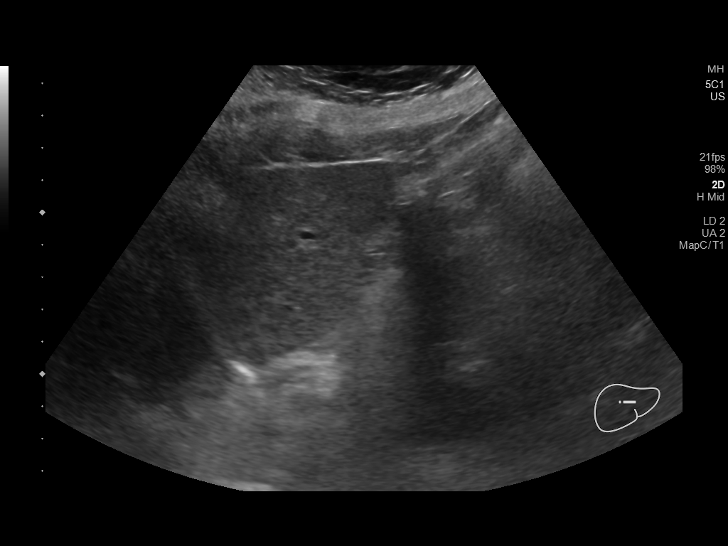
[im 77/84]
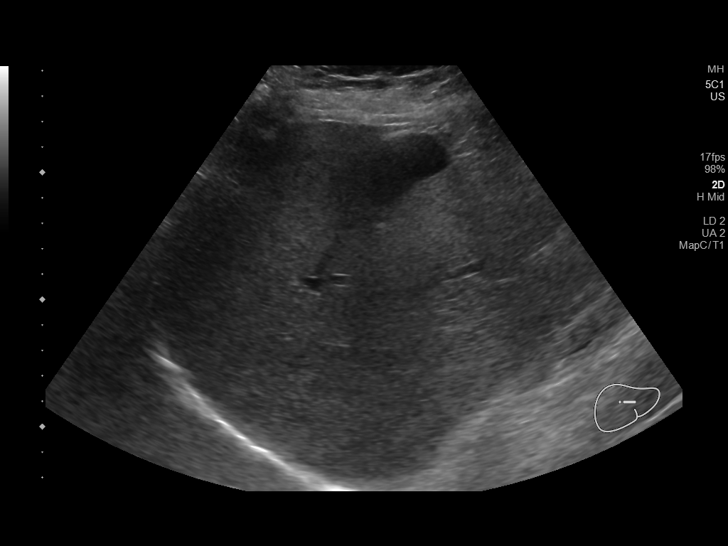
[im 84/84]
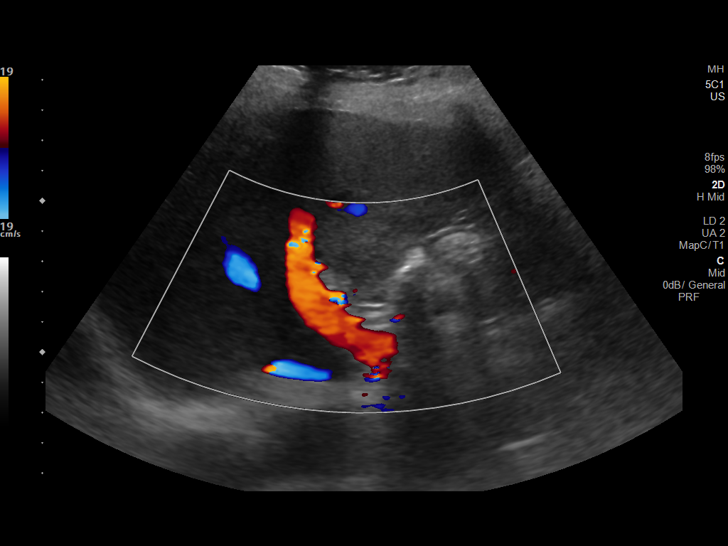

[14 of 25 positions shown; findings below may reference images not displayed]

FINDINGS: Gallbladder:

Small gallstones. Gallbladder sludge. Probable 4 mm gallbladder
polyp. Gallbladder wall non thickened. Negative sonographic Murphy
sign.

Common bile duct:

Diameter: 2.9 mm

Liver:

No focal lesion identified. Within normal limits in parenchymal
echogenicity. Portal vein is patent on color Doppler imaging with
normal direction of blood flow towards the liver.
IMPRESSION: Cholelithiasis and gallbladder sludge.  Negative for cholecystitis.

## 2021-03-09 MED ORDER — METFORMIN HCL 500 MG PO TABS: 500.0000 mg | ORAL_TABLET | Freq: Two times a day (BID) | ORAL | 3 refills | Status: DC

## 2021-03-11 ENCOUNTER — Other Ambulatory Visit: Payer: Self-pay

## 2021-03-23 ENCOUNTER — Other Ambulatory Visit: Payer: Self-pay | Admitting: Nurse Practitioner

## 2021-03-23 DIAGNOSIS — E1165 Type 2 diabetes mellitus with hyperglycemia: Secondary | ICD-10-CM

## 2021-03-23 DIAGNOSIS — F5105 Insomnia due to other mental disorder: Secondary | ICD-10-CM

## 2021-03-23 DIAGNOSIS — F4321 Adjustment disorder with depressed mood: Secondary | ICD-10-CM

## 2021-03-23 NOTE — Telephone Encounter (Signed)
Requested Prescriptions  Pending Prescriptions Disp Refills  . glimepiride (AMARYL) 4 MG tablet [Pharmacy Med Name: GLIMEPIRIDE  TAB 4MG ] 30 tablet     Sig: TAKE 1 TABLET BY MOUTH ONCE A DAY BEFORE BREAKFAST     Endocrinology:  Diabetes - Sulfonylureas Passed - 03/23/2021  2:57 PM      Passed - HBA1C is between 0 and 7.9 and within 180 days    Hgb A1c MFr Bld  Date Value Ref Range Status  03/08/2021 7.5 (H) 4.8 - 5.6 % Final    Comment:             Prediabetes: 5.7 - 6.4          Diabetes: >6.4          Glycemic control for adults with diabetes: <7.0          Passed - Valid encounter within last 6 months    Recent Outpatient Visits          1 month ago Type 2 diabetes mellitus with hyperglycemia, without long-term current use of insulin ([place])   [place], [place] W, NP   4 months ago Type 2 diabetes mellitus with hyperglycemia, without long-term current use of insulin [facility])   [place], Vernia Buff, NP   6 months ago Essential hypertension   [place], Vernia Buff, NP   8 months ago Essential hypertension   [place], Annie Main L, RPH-CPP   9 months ago Type 2 diabetes mellitus with hyperglycemia, without long-term current use of insulin [facility])   [place], Vernia Buff, NP      Future Appointments            In 1 week Gildardo Pounds, NP [place]           . amLODipine (NORVASC) 10 MG tablet [Pharmacy Med Name: AMLODIPINE   TAB 10MG ] 90 tablet 2    Sig: TAKE 1 TABLET BY MOUTH DAILY     Cardiovascular:  Calcium Channel Blockers Failed - 03/23/2021  2:57 PM      Failed - Last BP in normal range    BP Readings from Last 1 Encounters:  11/12/20 (!) 155/89         Passed - Valid encounter within last 6 months    Recent Outpatient Visits          1 month  ago Type 2 diabetes mellitus with hyperglycemia, without long-term current use of insulin ([place])   [place], [place] W, NP   4 months ago Type 2 diabetes mellitus with hyperglycemia, without long-term current use of insulin [facility])   [place], Vernia Buff, NP   6 months ago Essential hypertension   [place], Vernia Buff, NP   8 months ago Essential hypertension   [place], Annie Main L, RPH-CPP   9 months ago Type 2 diabetes mellitus with hyperglycemia, without long-term current use of insulin [facility])   [place], Vernia Buff, NP      Future Appointments            In 1 week Gildardo Pounds, NP [place]           .  traZODone (DESYREL) 100 MG tablet [Pharmacy Med Name: TRAZODONE TAB 100MG ] 90 tablet     Sig: TAKE 1/2 TO 1 TABLET (50-100MG )BY MOUTH AT BEDTIME AS NEEDED FOR SLEEP     Psychiatry: Antidepressants - Serotonin Modulator Passed - 03/23/2021  2:57 PM      Passed - Valid encounter within last 6 months    Recent Outpatient Visits          1 month ago Type 2 diabetes mellitus with hyperglycemia, without long-term current use of insulin [facility])   [place], [place] W, NP   4 months ago Type 2 diabetes mellitus with hyperglycemia, without long-term current use of insulin [facility])   [place], Vernia Buff, NP   6 months ago Essential hypertension   [place], Vernia Buff, NP   8 months ago Essential hypertension   [place], Annie Main L, RPH-CPP   9 months ago Type 2 diabetes mellitus with hyperglycemia, without long-term current use of insulin [facility])   [place], Vernia Buff, NP       Future Appointments            In 1 week Gildardo Pounds, NP [place]

## 2021-03-23 NOTE — Telephone Encounter (Signed)
Requested medication (s) are due for refill today: -  Requested medication (s) are on the active medication list: no expired  Last refill:  glimepiride: 01/24/21 (expired 02/23/21)                  Trazodone:  01/24/21  (expired 02/23/21)  Future visit scheduled: yes  Notes to clinic:  expired meds   Requested Prescriptions  Pending Prescriptions Disp Refills   glimepiride (AMARYL) 4 MG tablet [Pharmacy Med Name: GLIMEPIRIDE  TAB 4MG ] 30 tablet     Sig: TAKE 1 TABLET BY MOUTH ONCE A [place]      Endocrinology:  Diabetes - Sulfonylureas Passed - 03/23/2021  2:57 PM      Passed - HBA1C is between 0 and 7.9 and within 180 days    Hgb A1c MFr Bld  Date Value Ref Range Status  03/08/2021 7.5 (H) 4.8 - 5.6 % Final    Comment:             Prediabetes: 5.7 - 6.4          Diabetes: >6.4          Glycemic control for adults with diabetes: <7.0           Passed - Valid encounter within last 6 months    Recent Outpatient Visits           1 month ago Type 2 diabetes mellitus with hyperglycemia, without long-term current use of insulin ([place])   [place], [place] W, NP   4 months ago Type 2 diabetes mellitus with hyperglycemia, without long-term current use of insulin ([place])   [place], Vernia Buff, NP   6 months ago Essential hypertension   [place], Vernia Buff, NP   8 months ago Essential hypertension   [place], Annie Main L, RPH-CPP   9 months ago Type 2 diabetes mellitus with hyperglycemia, without long-term current use of insulin [facility])   [place], Vernia Buff, NP       Future Appointments             In 1 week Gildardo Pounds, NP [place]               traZODone (DESYREL) 100 MG tablet [Pharmacy Med Name: TRAZODONE TAB 100MG ] 90 tablet      Sig: TAKE 1/2 TO 1 TABLET (50-100MG )BY MOUTH AT BEDTIME AS NEEDED FOR SLEEP      Psychiatry: Antidepressants - Serotonin Modulator Passed - 03/23/2021  2:57 PM      Passed - Valid encounter within last 6 months    Recent Outpatient Visits           1 month ago Type 2 diabetes mellitus with hyperglycemia, without long-term current use of insulin ([place])   [place], [place] W, NP   4 months ago Type 2 diabetes mellitus with hyperglycemia, without long-term current use of insulin [facility])   [place], Vernia Buff, NP   6 months ago Essential hypertension   [place], Vernia Buff, NP   8 months ago Essential hypertension   [place], Annie Main L, RPH-CPP   9 months ago Type 2 diabetes mellitus with hyperglycemia, without  long-term current use of insulin [facility])   [place], Vernia Buff, NP       Future Appointments             In 1 week Gildardo Pounds, NP [place]              Signed Prescriptions Disp Refills   amLODipine (NORVASC) 10 MG tablet 90 tablet 2    Sig: TAKE 1 TABLET BY MOUTH DAILY      Cardiovascular:  Calcium Channel Blockers Failed - 03/23/2021  2:57 PM      Failed - Last BP in normal range    BP Readings from Last 1 Encounters:  11/12/20 (!) 155/89          Passed - Valid encounter within last 6 months    Recent Outpatient Visits           1 month ago Type 2 diabetes mellitus with hyperglycemia, without long-term current use of insulin ([place])   [place], [place] W, NP   4 months ago Type 2 diabetes mellitus with hyperglycemia, without long-term current use of insulin [facility])   [place], Vernia Buff, NP   6 months ago Essential hypertension   [place], Vernia Buff, NP   8 months ago Essential hypertension   [place], Annie Main L, RPH-CPP   9 months ago Type 2 diabetes mellitus with hyperglycemia, without long-term current use of insulin [facility])   [place], Vernia Buff, NP       Future Appointments             In 1 week Gildardo Pounds, NP [place]

## 2021-03-26 ENCOUNTER — Telehealth: Payer: Self-pay

## 2021-03-26 NOTE — Telephone Encounter (Signed)
Went to ask Maria Robinson if it was okay if pt did a physical when she came in. Per Maria Robinson that is fine she will need to bring paperwork with her if she has any.  Contacted pt and made aware of Maria Robinson response. Pt states she appreciates it and doesn't have any questions or concerns

## 2021-03-26 NOTE — Telephone Encounter (Signed)
Copied from [place] [id number]. Topic: Appointment Scheduling - Scheduling Inquiry for Clinic >> Mar 25, 2021  9:25 AM L[place] wrote: Reason for CRM to contact office, pt job is requesting her to have a CPE and pt has a BP ck on 4/13 and was wanting to know if there is anyway to change to a CPE for her job requirement. FU at [phone]. >> Mar 25, 2021  2:00 PM Greggory Keen D wrote: Pt returned call regarding appt.  I told her what the message said about they could not do a PE before June.  She ask that the nurse call her back.  CB#  -- >> Mar 25, 2021 11:32 AM Gardner Candle L wrote: Called pt to inform we can sched for physical in June W/ pcp, not sooner. No answer.

## 2021-04-03 ENCOUNTER — Ambulatory Visit: Payer: 59 | Attending: Nurse Practitioner | Admitting: Nurse Practitioner

## 2021-04-03 ENCOUNTER — Encounter: Payer: Self-pay | Admitting: Nurse Practitioner

## 2021-04-03 ENCOUNTER — Other Ambulatory Visit: Payer: Self-pay

## 2021-04-03 VITALS — BP 153/92 | HR 98 | Resp 20 | Ht 59.0 in | Wt 171.4 lb

## 2021-04-03 DIAGNOSIS — H9312 Tinnitus, left ear: Secondary | ICD-10-CM | POA: Diagnosis not present

## 2021-04-03 DIAGNOSIS — E1165 Type 2 diabetes mellitus with hyperglycemia: Secondary | ICD-10-CM | POA: Diagnosis not present

## 2021-04-03 DIAGNOSIS — I10 Essential (primary) hypertension: Secondary | ICD-10-CM | POA: Diagnosis not present

## 2021-04-03 MED ORDER — GLUCOSE BLOOD VI STRP: ORAL_STRIP | 2 refills | Status: DC

## 2021-04-03 MED ORDER — LISINOPRIL 40 MG PO TABS: 40.0000 mg | ORAL_TABLET | Freq: Every day | ORAL | 3 refills | Status: DC

## 2021-04-03 NOTE — Progress Notes (Signed)
Assessment & Plan:  Chetara was seen today for hypertension.  Diagnoses and all orders for this visit:  Essential hypertension -     lisinopril (ZESTRIL) 40 MG tablet; Take 1 tablet (40 mg total) by mouth daily. Continue all antihypertensives as prescribed.  Remember to bring in your blood pressure log with you for your follow up appointment.  DASH/Mediterranean Diets are healthier choices for HTN.    Type 2 diabetes mellitus with hyperglycemia, without long-term current use of insulin (HCC) -     glucose blood test strip; Use as instructed to check blood sugar daily. Continue blood sugar control as discussed in office today, low carbohydrate diet, and regular physical exercise as tolerated, 150 minutes per week (30 min each day, 5 days per week, or 50 min 3 days per week). Keep blood sugar logs with fasting goal of 90-130 mg/dl, post prandial (after you eat) less than 180.  For Hypoglycemia: BS <60 and Hyperglycemia BS >400; contact the clinic ASAP. Annual eye exams and foot exams are recommended.   Tinnitus of left ear -     Ambulatory referral to ENT    Patient has been counseled on age-appropriate routine health concerns for screening and prevention. These are reviewed and up-to-date. Referrals have been placed accordingly. Immunizations are up-to-date or declined.    Subjective:   Chief Complaint  Patient presents with  . Hypertension   HPI Arnetra [place] 65 y.o. female presents to office today for follow up to HTN.   Blood pressure not well controlled. We recently increased her lisinopril from 10 to 20 mg in February. Will increase to 40 mg today. She will continue on amlodipine 10 mg daily.  Denies chest pain, shortness of breath, palpitations, lightheadedness, dizziness, headaches or BLE edema.  BP Readings from Last 3 Encounters:  04/03/21 (!) 153/92  11/12/20 (!) 155/89  07/02/20 (!) 142/78   Tinnitus Onset of symptoms was  several months ago ago with unchanged  course since that time. Patient describes the tinnitus as fluctuating and recurrent located in the left ear. The quality is described as medium range pitch that sounds like whooshing. The pattern is nonpulsatile. Patient describes her level of annoyance as moderately annoying, always aware. Associated symptoms include hearing loss Patient has had no prior evaluation, treatment or surgery for tinnitus Patient does not have hearing aids at this time. Previous treatments include none.   Review of Systems  Constitutional: Negative for fever, malaise/fatigue and weight loss.  HENT: Positive for hearing loss and tinnitus. Negative for nosebleeds.   Eyes: Negative.  Negative for blurred vision, double vision and photophobia.  Respiratory: Negative.  Negative for cough and shortness of breath.   Cardiovascular: Negative.  Negative for chest pain, palpitations and leg swelling.  Gastrointestinal: Negative.  Negative for heartburn, nausea and vomiting.  Musculoskeletal: Negative.  Negative for myalgias.  Neurological: Negative.  Negative for dizziness, focal weakness, seizures and headaches.  Psychiatric/Behavioral: Negative.  Negative for suicidal ideas.    Past Medical History:  Diagnosis Date  . Allergy   . Cataract   . Diabetes mellitus without complication ([place])   . Hypertension   . Low blood potassium   . Sickle cell trait [facility])     Past Surgical History:  Procedure Laterality Date  . BIOPSY  06/13/2019   Procedure: BIOPSY;  Surgeon: Jackquline Denmark, MD;  Location: [facility];  Service: Endoscopy;;  . CESAREAN SECTION     x 3  . ESOPHAGOGASTRODUODENOSCOPY (EGD) WITH PROPOFOL N/A  06/13/2019   Procedure: ESOPHAGOGASTRODUODENOSCOPY (EGD) WITH PROPOFOL;  Surgeon: Jackquline Denmark, MD;  Location: [facility];  Service: Endoscopy;  Laterality: N/A;  . HAND SURGERY Right   . MALONEY DILATION  06/13/2019   Procedure: Venia Minks DILATION;  Surgeon: Jackquline Denmark, MD;  Location: [facility];  Service:  Endoscopy;;    Family History  Problem Relation Age of Onset  . Diabetes Father   . Colon cancer Neg Hx   . Colon polyps Neg Hx   . Esophageal cancer Neg Hx   . Rectal cancer Neg Hx   . Stomach cancer Neg Hx     Social History Reviewed with no changes to be made today.   Outpatient Medications Prior to Visit  Medication Sig Dispense Refill  . amLODipine (NORVASC) 10 MG tablet TAKE 1 TABLET BY MOUTH DAILY 90 tablet 2  . atorvastatin (LIPITOR) 20 MG tablet Take 1 tablet (20 mg total) by mouth daily. 90 tablet 3  . Blood Glucose Monitoring Suppl (ONETOUCH VERIO REFLECT) w/Device KIT Use to check blood sugar daily. 1 kit 0  . glimepiride (AMARYL) 4 MG tablet TAKE 1 TABLET BY MOUTH ONCE A DAY BEFORE BREAKFAST 90 tablet 0  . Lancets (ONETOUCH DELICA PLUS [id number]) MISC Use as instructed to check blood sugar daily. 100 each 2  . meloxicam (MOBIC) 15 MG tablet Take 1 tablet (15 mg total) by mouth daily. 30 tablet 1  . metFORMIN (GLUCOPHAGE) 500 MG tablet Take 1 tablet (500 mg total) by mouth 2 (two) times daily with a meal. 180 tablet 3  . pantoprazole (PROTONIX) 40 MG tablet Take 40 mg by mouth daily. (Patient not taking: No sig reported)    . Vitamin D, Ergocalciferol, (DRISDOL) 1.25 MG (50000 UNIT) CAPS capsule Take 1 capsule (50,000 Units total) by mouth every 7 (seven) days. 12 capsule 1  . azelastine (ASTELIN) 0.1 % nasal spray Place 1 spray into both nostrils 2 (two) times daily. Use in each nostril as directed 30 mL 12  . glucose blood test strip Use as instructed to check blood sugar daily. 100 each 2  . lisinopril (ZESTRIL) 20 MG tablet Take 1 tablet (20 mg total) by mouth daily. 90 tablet 0  . traZODone (DESYREL) 100 MG tablet TAKE 1/2 TO 1 TABLET (50-100MG)BY MOUTH AT BEDTIME AS NEEDED FOR SLEEP 90 tablet 0   No facility-administered medications prior to visit.    Allergies  Allergen Reactions  . Penicillins Swelling and Rash    Did it involve swelling of the  face/tongue/throat, SOB, or low BP? y Did it involve sudden or severe rash/hives, skin peeling, or any reaction on the inside of your mouth or nose? y Did you need to seek medical attention at a hospital or doctor's office? y When did it last happen? 2026 If all above answers are "NO", may proceed with cephalosporin use.       Objective:    BP (!) 153/92   Pulse 98   Resp 20   Ht '4\' 11"'  (1.499 m)   Wt 171 lb 6.4 oz (77.7 kg)   SpO2 97%   BMI 34.62 kg/m  Wt Readings from Last 3 Encounters:  04/03/21 171 lb 6.4 oz (77.7 kg)  11/12/20 164 lb 6.4 oz (74.6 kg)  06/01/20 165 lb (74.8 kg)    Physical Exam Vitals and nursing note reviewed.  Constitutional:      Appearance: She is well-developed.  HENT:     Head: Normocephalic and atraumatic.  Right Ear: Tympanic membrane, ear canal and external ear normal.     Left Ear: Tympanic membrane, ear canal and external ear normal.  Cardiovascular:     Rate and Rhythm: Normal rate and regular rhythm.     Heart sounds: Normal heart sounds. No murmur heard. No friction rub. No gallop.   Pulmonary:     Effort: Pulmonary effort is normal. No tachypnea or respiratory distress.     Breath sounds: Normal breath sounds. No decreased breath sounds, wheezing, rhonchi or rales.  Chest:     Chest wall: No tenderness.  Abdominal:     General: Bowel sounds are normal.     Palpations: Abdomen is soft.  Musculoskeletal:        General: Normal range of motion.     Cervical back: Normal range of motion.  Skin:    General: Skin is warm and dry.  Neurological:     Mental Status: She is alert and oriented to person, place, and time.     Coordination: Coordination normal.  Psychiatric:        Behavior: Behavior normal. Behavior is cooperative.        Thought Content: Thought content normal.        Judgment: Judgment normal.          Patient has been counseled extensively about nutrition and exercise as well as the importance of  adherence with medications and regular follow-up. The patient was given clear instructions to go to ER or return to medical center if symptoms don't improve, worsen or new problems develop. The patient verbalized understanding.   Follow-up: Return for NURSE VISIT ON MONDAY TB SKIN TEST.  LUKE 4 weeks BP chk needs morning appt. C me July.   Gildardo Pounds, FNP-BC [facility] [place], [place]   04/03/2021, 12:15 PM

## 2021-04-08 ENCOUNTER — Other Ambulatory Visit: Payer: Self-pay

## 2021-04-08 ENCOUNTER — Ambulatory Visit: Payer: 59

## 2021-04-09 ENCOUNTER — Other Ambulatory Visit: Payer: Self-pay

## 2021-04-09 ENCOUNTER — Ambulatory Visit: Payer: 59 | Attending: Nurse Practitioner

## 2021-04-09 DIAGNOSIS — Z111 Encounter for screening for respiratory tuberculosis: Secondary | ICD-10-CM | POA: Diagnosis not present

## 2021-04-09 NOTE — Progress Notes (Signed)
Tuberculin skin test applied to right ventral forearm.  Patient informed to schedule appt for nurse visit in 48-72 hours to have site read.  Jay'A R Ignatius Kloos, RMA  

## 2021-04-11 ENCOUNTER — Other Ambulatory Visit: Payer: Self-pay

## 2021-04-11 ENCOUNTER — Ambulatory Visit: Payer: 59 | Attending: Nurse Practitioner

## 2021-04-11 DIAGNOSIS — Z111 Encounter for screening for respiratory tuberculosis: Secondary | ICD-10-CM

## 2021-04-11 LAB — TB SKIN TEST
Induration: 0 mm
TB Skin Test: NEGATIVE

## 2021-04-11 NOTE — Progress Notes (Signed)
Patient here today to have PPD site read.   PPD read and results entered in [place]. Result: 0 mm induration. Interpretation: Negative Jackelyn Knife, RMA

## 2021-04-26 ENCOUNTER — Telehealth: Payer: Self-pay | Admitting: Nurse Practitioner

## 2021-04-26 DIAGNOSIS — I10 Essential (primary) hypertension: Secondary | ICD-10-CM

## 2021-04-26 MED ORDER — LISINOPRIL 40 MG PO TABS: 40.0000 mg | ORAL_TABLET | Freq: Every day | ORAL | 0 refills | Status: DC

## 2021-04-26 MED ORDER — LISINOPRIL 40 MG PO TABS: 40.0000 mg | ORAL_TABLET | Freq: Every day | ORAL | 3 refills | Status: DC

## 2021-04-26 NOTE — Telephone Encounter (Signed)
[facility]

## 2021-04-26 NOTE — Addendum Note (Signed)
Addended by: Daisy Blossom, Annie Main L on: 04/26/2021 02:56 PM   Modules accepted: Orders

## 2021-04-26 NOTE — Addendum Note (Signed)
Addended by: Jefferson Fuel on: 04/26/2021 01:05 PM   Modules accepted: Orders

## 2021-04-26 NOTE — Telephone Encounter (Signed)
Script needs to be faxed because it keeps printing when you try to send electronically.

## 2021-04-26 NOTE — Telephone Encounter (Signed)
Copied from [place] [id number]. Topic: Quick Communication - Rx Refill/Question >> Apr 26, 2021 12:58 PM Tessa Lerner A wrote: Medication: lisinopril (ZESTRIL) 40 MG tablet  - patient has 0(zero) tablets remaining  Has the patient contacted their pharmacy? Patient has contacted their pharmacy and been directed to contact their PCP  Preferred Pharmacy (with phone number or street name): [facility] ([place], [place]  Phone:  [phone] Fax:  [phone]  Agent: Please be advised that RX refills may take up to 3 business days. We ask that you follow-up with your pharmacy.

## 2021-04-26 NOTE — Telephone Encounter (Signed)
Copied from [place] [id number]. Topic: General - Other >> Apr 26, 2021  1:00 PM Tessa Lerner A wrote: Reason for CRM: Patient would like a bridge prescription of lisinopril (ZESTRIL) 40 MG tablet  [facility]   [id number] [place] [place] [facility] [address] AT [address]  Phone:  [phone] Fax:  [phone]  Patient has contacted their preferred pharmacy, [facility], and been told that no prescription has been submitted for them  The patient is certain that a prescription was submitted though due to a previous conversation with their PCP that confirmed the submission  Patient would like to be contacted regarding this prescription when possible   Collier [place] would you be able to resend this prescription? It was sent today but its showing as being printed and not electronic.

## 2021-04-26 NOTE — Telephone Encounter (Signed)
Rx sent 

## 2021-04-30 ENCOUNTER — Other Ambulatory Visit: Payer: Self-pay

## 2021-04-30 ENCOUNTER — Ambulatory Visit: Payer: 59 | Attending: Nurse Practitioner | Admitting: Pharmacist

## 2021-04-30 ENCOUNTER — Other Ambulatory Visit: Payer: Self-pay | Admitting: Pharmacist

## 2021-04-30 ENCOUNTER — Other Ambulatory Visit: Payer: Self-pay | Admitting: Nurse Practitioner

## 2021-04-30 VITALS — BP 134/81

## 2021-04-30 DIAGNOSIS — I10 Essential (primary) hypertension: Secondary | ICD-10-CM | POA: Diagnosis not present

## 2021-04-30 MED ORDER — HYDROCHLOROTHIAZIDE 25 MG PO TABS
25.0000 mg | ORAL_TABLET | Freq: Every day | ORAL | 1 refills | Status: DC
Start: 2021-04-30 — End: 2021-10-12

## 2021-04-30 NOTE — Progress Notes (Signed)
   S:    Patient arrives well and good spirits.    Presents to the clinic for hypertension evaluation, counseling, and management. Patient was referred and last seen by Primary Care Provider, Geryl Rankins, on 04/03/21. At this visit, amlodipine 10 mg was continued and lisinopril was increased to 40 mg.   Today, patient reports appropriate adherence to medications. Denies dizziness, headache, chest pain. Taken all medications earlier this AM. She reports occasionally fluid retention in ankles, legs, knees and hands when she is active. Reports she is on her feet most of the day.  Current BP Medications include:  Amlodipine 10 mg, lisinopril 40 mg  Antihypertensives tried in the past include: HCTZ 25 mg (d/c reason unknown)  O:  Vitals:   04/30/21 1627  BP: 134/81   Home readings: subjectively reports SBP in the 140s  Last 3 Office BP readings: BP Readings from Last 3 Encounters:  04/03/21 (!) 153/92  11/12/20 (!) 155/89  07/02/20 (!) 142/78   BMET    Component Value Date/Time   NA 138 03/08/2021 1051   K 4.4 03/08/2021 1051   CL 99 03/08/2021 1051   CO2 20 03/08/2021 1051   GLUCOSE 112 (H) 03/08/2021 1051   GLUCOSE 106 (H) 08/16/2020 0947   BUN 15 03/08/2021 1051   CREATININE 0.84 03/08/2021 1051   CREATININE 0.90 08/16/2020 0947   CALCIUM 9.6 03/08/2021 1051   GFRNONAA 86 11/12/2020 1404   GFRNONAA 68 08/16/2020 0947   GFRAA 100 11/12/2020 1404   GFRAA 79 08/16/2020 0947    Renal function: CrCl cannot be calculated (Patient's most recent lab result is older than the maximum 21 days allowed.).  Clinical ASCVD: NO The 10-year ASCVD risk score Mikey Bussing DC Jr., et al., 2013) is: 24.8%   Values used to calculate the score:     Age: 65 years     Sex: Female     Is Non-Hispanic African American: Yes     Diabetic: Yes     Tobacco smoker: No     Systolic Blood Pressure: 595 mmHg     Is BP treated: Yes     HDL Cholesterol: 62 mg/dL     Total Cholesterol: 172  mg/dL    A/P: Hypertension longstanding currently uncontrolled on current medications. BP Goal = <130/80 mmHg. Medication adherence appropriate. BP today was initially quite elevated with SBP >150 mmHg. After allowing the patient to rest, BP decreased to 134/81 mmHg. Given initial reading and subjectively reported home readings, will restart HCTZ today. Patient previously on HCTZ in 2020, without intolerable per patient. She does have an acute history of hyponatremia from a previous hospitalization - due to this, we will plan to check BMP at next follow-up.  -Start HCTZ 25 mg once daily. -Continue amlodipine and lisinopril -Counseled on lifestyle modifications for blood pressure control including reduced dietary sodium, increased exercise, adequate sleep.  Results reviewed and written information provided.   Total time in face-to-face counseling 20 minutes.   F/U Clinic Visit with Pharmacist in 1 month.  Patient seen with Jehan Bonano.  Harriet Pho, PharmD PGY-1 [facility] Pharmacy Resident   04/30/2021 4:36 PM

## 2021-05-13 ENCOUNTER — Telehealth: Payer: Self-pay | Admitting: Nurse Practitioner

## 2021-05-13 MED ORDER — AMLODIPINE BESYLATE 10 MG PO TABS: 1.0000 | ORAL_TABLET | Freq: Every day | ORAL | 1 refills | Status: DC

## 2021-05-13 NOTE — Telephone Encounter (Signed)
[facility]

## 2021-05-13 NOTE — Telephone Encounter (Signed)
Copied from [place] [id number]. Topic: General - Other >> May 13, 2021 11:35 AM P[place], Brayton Layman A wrote: Reason for CRM: Pt stated she has been having issues with her mail order pharmacy, pt stated she would prefer [facility] in [facility].  Pt wanted a follow up call regarding amLODipine (NORVASC) 10 MG tablet, stated the mail order pharmacy won't refill it although she has refills left.

## 2021-05-14 NOTE — Telephone Encounter (Signed)
[facility] aware [facility]

## 2021-05-24 ENCOUNTER — Other Ambulatory Visit: Payer: Self-pay | Admitting: Family Medicine

## 2021-05-31 ENCOUNTER — Other Ambulatory Visit: Payer: Self-pay

## 2021-05-31 ENCOUNTER — Encounter: Payer: Self-pay | Admitting: Pharmacist

## 2021-05-31 ENCOUNTER — Ambulatory Visit: Payer: 59 | Attending: Nurse Practitioner | Admitting: Pharmacist

## 2021-05-31 VITALS — BP 111/72

## 2021-05-31 DIAGNOSIS — I10 Essential (primary) hypertension: Secondary | ICD-10-CM | POA: Diagnosis not present

## 2021-05-31 NOTE — Progress Notes (Signed)
   S:    PCP: Sherese   Patient arrives well and good spirits. Presents to the clinic for hypertension evaluation, counseling, and management. Patient was referred and last seen by Primary Care Provider, Geryl Rankins, on 04/03/21. I saw her on 04/30/2021 and added HCTZ.   Today, patient reports appropriate adherence to medications. Denies dizziness, headache, chest pain. Taken all medications earlier this AM. She reports improvement in fluid retention in ankles, legs, knees and hands when she is active. She thinks the HCTZ is helping tremendously with this.   Current BP Medications include:  Amlodipine 10 mg daily, HCTZ 25 mg daily, lisinopril 40 mg daily   O:  Vitals:   05/31/21 0852  BP: 111/72   Home readings: subjectively reports BPs in the 120s/70s  Last 3 Office BP readings: BP Readings from Last 3 Encounters:  05/31/21 111/72  04/30/21 134/81  04/03/21 (!) 153/92   BMET    Component Value Date/Time   NA 138 03/08/2021 1051   K 4.4 03/08/2021 1051   CL 99 03/08/2021 1051   CO2 20 03/08/2021 1051   GLUCOSE 112 (H) 03/08/2021 1051   GLUCOSE 106 (H) 08/16/2020 0947   BUN 15 03/08/2021 1051   CREATININE 0.84 03/08/2021 1051   CREATININE 0.90 08/16/2020 0947   CALCIUM 9.6 03/08/2021 1051   GFRNONAA 86 11/12/2020 1404   GFRNONAA 68 08/16/2020 0947   GFRAA 100 11/12/2020 1404   GFRAA 79 08/16/2020 0947    Renal function: CrCl cannot be calculated (Patient's most recent lab result is older than the maximum 21 days allowed.).  Clinical ASCVD: NO The 10-year ASCVD risk score Mikey Bussing DC Jr., et al., 2013) is: 11.8%   Values used to calculate the score:     Age: 48 years     Sex: Female     Is Non-Hispanic African American: Yes     Diabetic: Yes     Tobacco smoker: No     Systolic Blood Pressure: 235 mmHg     Is BP treated: Yes     HDL Cholesterol: 62 mg/dL     Total Cholesterol: 172 mg/dL    A/P: Hypertension longstanding currently at goal on current medications. BP  Goal = <130/80 mmHg. Medication adherence appropriate. She has noted improvement in LE edema. I think her benefit with HCTZ is enough that we could consider supplementing K and keeping HCTZ is she develops mild hypokalemia with HCTZ use.  -Continue amlodipine, lisinopril, HCTZ at current doses.  -Counseled on lifestyle modifications for blood pressure control including reduced dietary sodium, increased exercise, adequate sleep.  Results reviewed and written information provided.   Total time in face-to-face counseling 20 minutes.   F/U Clinic Visit with Pharmacist in 1 month.    Benard Halsted, PharmD, [place], [place] [phone]

## 2021-06-01 LAB — BASIC METABOLIC PANEL
BUN/Creatinine Ratio: 25 (ref 12–28)
BUN: 27 mg/dL (ref 8–27)
CO2: 21 mmol/L (ref 20–29)
Calcium: 9.8 mg/dL (ref 8.7–10.3)
Chloride: 101 mmol/L (ref 96–106)
Creatinine, Ser: 1.09 mg/dL — ABNORMAL HIGH (ref 0.57–1.00)
Glucose: 125 mg/dL — ABNORMAL HIGH (ref 65–99)
Potassium: 4.7 mmol/L (ref 3.5–5.2)
Sodium: 140 mmol/L (ref 134–144)
eGFR: 57 mL/min/{1.73_m2} — ABNORMAL LOW (ref >59)

## 2021-06-03 ENCOUNTER — Telehealth: Payer: Self-pay

## 2021-06-03 NOTE — Telephone Encounter (Signed)
Patient was called and a voicemail was left informing patient to return phone call for lab results. 

## 2021-06-03 NOTE — Telephone Encounter (Signed)
-----   Message from Gildardo Pounds, NP sent at 06/02/2021  2:15 PM EDT ----- Normal kidney function. Glucose slightly elevated. All other electrolytes normal.

## 2021-06-14 ENCOUNTER — Encounter: Payer: Self-pay | Admitting: *Deleted

## 2021-07-16 ENCOUNTER — Other Ambulatory Visit: Payer: Self-pay

## 2021-07-16 ENCOUNTER — Ambulatory Visit: Payer: 59 | Attending: Nurse Practitioner | Admitting: Nurse Practitioner

## 2021-07-16 NOTE — Progress Notes (Signed)
No answer. Unable to leave VM.

## 2021-07-16 NOTE — Progress Notes (Signed)
Mailbox full. Unable to leave VM.

## 2021-07-28 ENCOUNTER — Other Ambulatory Visit: Payer: Self-pay | Admitting: Nurse Practitioner

## 2021-07-28 NOTE — Telephone Encounter (Signed)
Requested Prescriptions  Pending Prescriptions Disp Refills  . lisinopril (ZESTRIL) 40 MG tablet [Pharmacy Med Name: LISINOPRIL 40 MG TABLET] 90 tablet 1    Sig: TAKE 1 TABLET BY MOUTH EVERY DAY     Cardiovascular:  ACE Inhibitors Failed - 07/28/2021  1:05 AM      Failed - Cr in normal range and within 180 days    Creat  Date Value Ref Range Status  08/16/2020 0.90 0.50 - 0.99 mg/dL Final    Comment:    For patients >64 years of age, the reference limit for Creatinine is approximately 13% higher for people identified as African-American. .    Creatinine, Ser  Date Value Ref Range Status  05/31/2021 1.09 (H) 0.57 - 1.00 mg/dL Final         Passed - K in normal range and within 180 days    Potassium  Date Value Ref Range Status  05/31/2021 4.7 3.5 - 5.2 mmol/L Final         Passed - Patient is not pregnant      Passed - Last BP in normal range    BP Readings from Last 1 Encounters:  05/31/21 111/72         Passed - Valid encounter within last 6 months    Recent Outpatient Visits          1 month ago Essential hypertension   [place], Jarome Matin, RPH-CPP   2 months ago Essential hypertension   [place], Jarome Matin, RPH-CPP   3 months ago Essential hypertension   [place], [place] W, NP   5 months ago Type 2 diabetes mellitus with hyperglycemia, without long-term current use of insulin [facility])   [place], [place] W, NP   8 months ago Type 2 diabetes mellitus with hyperglycemia, without long-term current use of insulin [facility])   [place], Vernia Buff, NP

## 2021-10-07 ENCOUNTER — Other Ambulatory Visit: Payer: Self-pay

## 2021-10-07 ENCOUNTER — Ambulatory Visit: Payer: 59 | Attending: Nurse Practitioner

## 2021-10-07 DIAGNOSIS — Z23 Encounter for immunization: Secondary | ICD-10-CM | POA: Diagnosis not present

## 2021-10-08 ENCOUNTER — Other Ambulatory Visit: Payer: Self-pay | Admitting: Family Medicine

## 2021-10-08 ENCOUNTER — Other Ambulatory Visit: Payer: Self-pay | Admitting: Nurse Practitioner

## 2021-10-08 DIAGNOSIS — E1165 Type 2 diabetes mellitus with hyperglycemia: Secondary | ICD-10-CM

## 2021-10-08 NOTE — Telephone Encounter (Signed)
Patient will need an office visit for further refills. Requested Prescriptions  Pending Prescriptions Disp Refills  . Lancets (ONETOUCH DELICA PLUS [id number]) [place] [Pharmacy Med Name: ONETOUCH DEL MIS PLUS 33G] 100 each 1    Sig: USE AS INSTRUCTED TO CHECK BLOOD SUGAR DAILY.     Endocrinology: Diabetes - Testing Supplies Passed - 10/08/2021  1:25 PM      Passed - Valid encounter within last 12 months    Recent Outpatient Visits          4 months ago Essential hypertension   [place], RPH-CPP   5 months ago Essential hypertension   [place], RPH-CPP   6 months ago Essential hypertension   [place], Vernia Buff, NP   7 months ago Type 2 diabetes mellitus with hyperglycemia, without long-term current use of insulin ([place])   [place], Vernia Buff, NP   11 months ago Type 2 diabetes mellitus with hyperglycemia, without long-term current use of insulin ([place])   [place], [place] W, NP             . glimepiride (AMARYL) 4 MG tablet [Pharmacy Med Name: GLIMEPIRIDE  TAB 4MG ] 30 tablet 0    Sig: TAKE 1 TABLET BY MOUTH ONCE A DAY BEFORE BREAKFAST     Endocrinology:  Diabetes - Sulfonylureas Failed - 10/08/2021  1:25 PM      Failed - HBA1C is between 0 and 7.9 and within 180 days    Hgb A1c MFr Bld  Date Value Ref Range Status  03/08/2021 7.5 (H) 4.8 - 5.6 % Final    Comment:             Prediabetes: 5.7 - 6.4          Diabetes: >6.4          Glycemic control for adults with diabetes: <7.0          Passed - Valid encounter within last 6 months    Recent Outpatient Visits          4 months ago Essential hypertension   [place], Jarome Matin, RPH-CPP   5 months ago Essential hypertension   [place], Jarome Matin, RPH-CPP   6 months ago Essential hypertension   [place], [place] W, NP   7 months ago Type 2 diabetes mellitus with hyperglycemia, without long-term current use of insulin [facility])   [place] , [place] W, NP   11 months ago Type 2 diabetes mellitus with hyperglycemia, without long-term current use of insulin [facility])   [place], Vernia Buff, NP

## 2021-10-08 NOTE — Telephone Encounter (Signed)
Requested medications are due for refill today.  unknown  Requested medications are on the active medications list.  no  Last refill. 03/25/2021  Future visit scheduled.   no  Notes to clinic.  Medication was discontinued by Dr. Margarita Rana 04/03/2021.

## 2021-10-12 ENCOUNTER — Other Ambulatory Visit: Payer: Self-pay | Admitting: Family Medicine

## 2021-10-12 NOTE — Telephone Encounter (Signed)
Requested Prescriptions  Pending Prescriptions Disp Refills  . hydrochlorothiazide (HYDRODIURIL) 25 MG tablet [Pharmacy Med Name: HYDROCHLOROTHIAZIDE 25 MG TAB] 90 tablet 0    Sig: TAKE 1 TABLET (25 MG TOTAL) BY MOUTH DAILY.     Cardiovascular: Diuretics - Thiazide Failed - 10/12/2021  9:21 AM      Failed - Cr in normal range and within 360 days    Creat  Date Value Ref Range Status  08/16/2020 0.90 0.50 - 0.99 mg/dL Final    Comment:    For patients >47 years of age, the reference limit for Creatinine is approximately 13% higher for people identified as African-American. .    Creatinine, Ser  Date Value Ref Range Status  05/31/2021 1.09 (H) 0.57 - 1.00 mg/dL Final         Passed - Ca in normal range and within 360 days    Calcium  Date Value Ref Range Status  05/31/2021 9.8 8.7 - 10.3 mg/dL Final         Passed - K in normal range and within 360 days    Potassium  Date Value Ref Range Status  05/31/2021 4.7 3.5 - 5.2 mmol/L Final         Passed - Na in normal range and within 360 days    Sodium  Date Value Ref Range Status  05/31/2021 140 134 - 144 mmol/L Final         Passed - Last BP in normal range    BP Readings from Last 1 Encounters:  05/31/21 111/72         Passed - Valid encounter within last 6 months    Recent Outpatient Visits          4 months ago Essential hypertension   [place], Jarome Matin, RPH-CPP   5 months ago Essential hypertension   [place], Jarome Matin, RPH-CPP   6 months ago Essential hypertension   [place], [place] W, NP   8 months ago Type 2 diabetes mellitus with hyperglycemia, without long-term current use of insulin [facility])   [place], [place] W, NP   11 months ago Type 2 diabetes mellitus with hyperglycemia, without long-term current use of insulin [facility])   [place], Vernia Buff, NP

## 2021-10-30 ENCOUNTER — Other Ambulatory Visit: Payer: Self-pay | Admitting: Nurse Practitioner

## 2021-10-30 MED ORDER — HYDROCHLOROTHIAZIDE 25 MG PO TABS: 25.0000 mg | ORAL_TABLET | Freq: Every day | ORAL | 0 refills | Status: DC

## 2021-10-30 NOTE — Telephone Encounter (Signed)
Requested Prescriptions  Pending Prescriptions Disp Refills  . hydrochlorothiazide (HYDRODIURIL) 25 MG tablet 90 tablet 0    Sig: Take 1 tablet (25 mg total) by mouth daily.     Cardiovascular: Diuretics - Thiazide Failed - 10/30/2021 11:26 AM      Failed - Cr in normal range and within 360 days    Creat  Date Value Ref Range Status  08/16/2020 0.90 0.50 - 0.99 mg/dL Final    Comment:    For patients >64 years of age, the reference limit for Creatinine is approximately 13% higher for people identified as African-American. .    Creatinine, Ser  Date Value Ref Range Status  05/31/2021 1.09 (H) 0.57 - 1.00 mg/dL Final         Passed - Ca in normal range and within 360 days    Calcium  Date Value Ref Range Status  05/31/2021 9.8 8.7 - 10.3 mg/dL Final         Passed - K in normal range and within 360 days    Potassium  Date Value Ref Range Status  05/31/2021 4.7 3.5 - 5.2 mmol/L Final         Passed - Na in normal range and within 360 days    Sodium  Date Value Ref Range Status  05/31/2021 140 134 - 144 mmol/L Final         Passed - Last BP in normal range    BP Readings from Last 1 Encounters:  05/31/21 111/72         Passed - Valid encounter within last 6 months    Recent Outpatient Visits          5 months ago Essential hypertension   [place], Jarome Matin, RPH-CPP   6 months ago Essential hypertension   [place], Jarome Matin, RPH-CPP   7 months ago Essential hypertension   [place], [place] W, NP   8 months ago Type 2 diabetes mellitus with hyperglycemia, without long-term current use of insulin [facility])   [place], [place] W, NP   11 months ago Type 2 diabetes mellitus with hyperglycemia, without long-term current use of insulin [facility])   [place], Vernia Buff, NP

## 2021-10-30 NOTE — Telephone Encounter (Signed)
Medication Refill - Medication: Hydrochlorothiazide  Has the patient contacted their pharmacy? Yes.   Pt states that she contacted the pharmacy and she has no more refills. She states that she only has 5 pills left. Please advise.  (Agent: If no, request that the patient contact the pharmacy for the refill. If patient does not wish to contact the pharmacy document the reason why and proceed with request.) (Agent: If yes, when and what did the pharmacy advise?)  Preferred Pharmacy (with phone number or street name):  [id number] - [place], [place] - [address] AT [place]  [place] [address]  Phone: [phone] Fax: [phone]  Hours: Not open 24 hours   Has the patient been seen for an appointment in the last year OR does the patient have an upcoming appointment? Yes.    Agent: Please be advised that RX refills may take up to 3 business days. We ask that you follow-up with your pharmacy.

## 2021-11-27 ENCOUNTER — Other Ambulatory Visit: Payer: Self-pay | Admitting: Nurse Practitioner

## 2021-11-27 DIAGNOSIS — E1165 Type 2 diabetes mellitus with hyperglycemia: Secondary | ICD-10-CM

## 2021-11-27 NOTE — Telephone Encounter (Signed)
Pt called to schedule appt in January / I advised pt tat next opening was beginning of February / pt scheduled appt but thinks she will not be able to get her diabetes meds filled if her appt is that far out/ please advise if pt needs sooner appt

## 2021-11-27 NOTE — Telephone Encounter (Signed)
Requested medication (s) are due for refill today: yes  Requested medication (s) are on the active medication list: yes  Last refill:  10/08/21 #30 0 refills  Future visit scheduled: no  Notes to clinic:  do you want to refill Rx? Called patient to schedule future appt. No answer, left message on voicemail to call clinic back [phone] to schedule appt for further refills .     Requested Prescriptions  Pending Prescriptions Disp Refills   glimepiride (AMARYL) 4 MG tablet [Pharmacy Med Name: GLIMEPIRIDE  TAB 4MG ] 60 tablet     Sig: TAKE 1 TABLET BY MOUTH ONCE A DAY BEFORE BREAKFAST (APPOINTMENT NEEDED FOR FUTURE REFILLS)     Endocrinology:  Diabetes - Sulfonylureas Failed - 11/27/2021 10:58 AM      Failed - HBA1C is between 0 and 7.9 and within 180 days    Hgb A1c MFr Bld  Date Value Ref Range Status  03/08/2021 7.5 (H) 4.8 - 5.6 % Final    Comment:             Prediabetes: 5.7 - 6.4          Diabetes: >6.4          Glycemic control for adults with diabetes: <7.0           Passed - Valid encounter within last 6 months    Recent Outpatient Visits           6 months ago Essential hypertension   [place], Jarome Matin, RPH-CPP   7 months ago Essential hypertension   [place], Jarome Matin, RPH-CPP   7 months ago Essential hypertension   [place], [place] W, NP   9 months ago Type 2 diabetes mellitus with hyperglycemia, without long-term current use of insulin [facility])   [place], [place] W, NP   1 year ago Type 2 diabetes mellitus with hyperglycemia, without long-term current use of insulin [facility])   [place], Vernia Buff, NP

## 2021-11-27 NOTE — Telephone Encounter (Signed)
Called patient to schedule future appt for medication refills. No answer, left message on voicemail to call back and schedule future appt for medication refills . [phone].

## 2022-01-06 ENCOUNTER — Other Ambulatory Visit: Payer: Self-pay | Admitting: Nurse Practitioner

## 2022-01-06 NOTE — Telephone Encounter (Signed)
Requested medication (s) are due for refill today:   Yes  Requested medication (s) are on the active medication list:   Yes  Future visit scheduled:   Yes 01/29/2022 with Raul Del   Last ordered: 07/28/2021 #90, 1 refill  Returned due to labs over due   Requested Prescriptions  Pending Prescriptions Disp Refills   lisinopril (ZESTRIL) 40 MG tablet [Pharmacy Med Name: LISINOPRIL 40 MG TABLET] 90 tablet 1    Sig: TAKE 1 TABLET BY MOUTH EVERY DAY     Cardiovascular:  ACE Inhibitors Failed - 01/06/2022  1:29 AM      Failed - Cr in normal range and within 180 days    Creat  Date Value Ref Range Status  08/16/2020 0.90 0.50 - 0.99 mg/dL Final    Comment:    For patients >75 years of age, the reference limit for Creatinine is approximately 13% higher for people identified as African-American. .    Creatinine, Ser  Date Value Ref Range Status  05/31/2021 1.09 (H) 0.57 - 1.00 mg/dL Final          Failed - K in normal range and within 180 days    Potassium  Date Value Ref Range Status  05/31/2021 4.7 3.5 - 5.2 mmol/L Final          Passed - Patient is not pregnant      Passed - Last BP in normal range    BP Readings from Last 1 Encounters:  05/31/21 111/72          Passed - Valid encounter within last 6 months    Recent Outpatient Visits           7 months ago Essential hypertension   [place], Jarome Matin, RPH-CPP   8 months ago Essential hypertension   [place], Jarome Matin, RPH-CPP   9 months ago Essential hypertension   [place], [place] W, NP   10 months ago Type 2 diabetes mellitus with hyperglycemia, without long-term current use of insulin [facility])   [place], [place] W, NP   1 year ago Type 2 diabetes mellitus with hyperglycemia, without long-term current use of insulin [facility])   [place], Vernia Buff, NP       Future Appointments             In 3 weeks Gildardo Pounds, NP [place]

## 2022-01-29 ENCOUNTER — Other Ambulatory Visit: Payer: Self-pay

## 2022-01-29 ENCOUNTER — Encounter: Payer: Self-pay | Admitting: Nurse Practitioner

## 2022-01-29 ENCOUNTER — Ambulatory Visit: Payer: Medicare HMO | Attending: Nurse Practitioner | Admitting: Nurse Practitioner

## 2022-01-29 VITALS — BP 132/82 | HR 94 | Ht 59.0 in | Wt 174.5 lb

## 2022-01-29 DIAGNOSIS — Z23 Encounter for immunization: Secondary | ICD-10-CM

## 2022-01-29 DIAGNOSIS — Z1231 Encounter for screening mammogram for malignant neoplasm of breast: Secondary | ICD-10-CM

## 2022-01-29 DIAGNOSIS — Z9189 Other specified personal risk factors, not elsewhere classified: Secondary | ICD-10-CM

## 2022-01-29 DIAGNOSIS — D573 Sickle-cell trait: Secondary | ICD-10-CM | POA: Diagnosis not present

## 2022-01-29 DIAGNOSIS — E785 Hyperlipidemia, unspecified: Secondary | ICD-10-CM | POA: Diagnosis not present

## 2022-01-29 DIAGNOSIS — J329 Chronic sinusitis, unspecified: Secondary | ICD-10-CM | POA: Diagnosis not present

## 2022-01-29 DIAGNOSIS — H6121 Impacted cerumen, right ear: Secondary | ICD-10-CM

## 2022-01-29 DIAGNOSIS — H9312 Tinnitus, left ear: Secondary | ICD-10-CM

## 2022-01-29 DIAGNOSIS — E1165 Type 2 diabetes mellitus with hyperglycemia: Secondary | ICD-10-CM | POA: Diagnosis not present

## 2022-01-29 DIAGNOSIS — E559 Vitamin D deficiency, unspecified: Secondary | ICD-10-CM

## 2022-01-29 DIAGNOSIS — D509 Iron deficiency anemia, unspecified: Secondary | ICD-10-CM | POA: Diagnosis not present

## 2022-01-29 DIAGNOSIS — K219 Gastro-esophageal reflux disease without esophagitis: Secondary | ICD-10-CM

## 2022-01-29 DIAGNOSIS — I10 Essential (primary) hypertension: Secondary | ICD-10-CM

## 2022-01-29 DIAGNOSIS — B9689 Other specified bacterial agents as the cause of diseases classified elsewhere: Secondary | ICD-10-CM

## 2022-01-29 DIAGNOSIS — Z780 Asymptomatic menopausal state: Secondary | ICD-10-CM

## 2022-01-29 LAB — POCT GLYCOSYLATED HEMOGLOBIN (HGB A1C): Hemoglobin A1C: 7.8 % — AB (ref 4.0–5.6)

## 2022-01-29 LAB — GLUCOSE, POCT (MANUAL RESULT ENTRY): POC Glucose: 147 mg/dl — AB (ref 70–99)

## 2022-01-29 MED ORDER — LISINOPRIL 40 MG PO TABS: 40.0000 mg | ORAL_TABLET | Freq: Every day | ORAL | 1 refills | Status: DC

## 2022-01-29 MED ORDER — ONETOUCH DELICA PLUS LANCET33G MISC: 1 refills | Status: DC

## 2022-01-29 MED ORDER — GLUCOSE BLOOD VI STRP: ORAL_STRIP | 2 refills | Status: DC

## 2022-01-29 MED ORDER — GLIMEPIRIDE 4 MG PO TABS: 4.0000 mg | ORAL_TABLET | Freq: Every day | ORAL | 1 refills | Status: DC

## 2022-01-29 MED ORDER — METFORMIN HCL 500 MG PO TABS: 500.0000 mg | ORAL_TABLET | Freq: Two times a day (BID) | ORAL | 3 refills | Status: DC

## 2022-01-29 MED ORDER — ATORVASTATIN CALCIUM 20 MG PO TABS: 20.0000 mg | ORAL_TABLET | Freq: Every day | ORAL | 3 refills | Status: DC

## 2022-01-29 MED ORDER — AMLODIPINE BESYLATE 10 MG PO TABS: 10.0000 mg | ORAL_TABLET | Freq: Every day | ORAL | 1 refills | Status: DC

## 2022-01-29 MED ORDER — SHINGRIX 50 MCG/0.5ML IM SUSR: 0.5000 mL | Freq: Once | INTRAMUSCULAR | 0 refills | Status: AC

## 2022-01-29 MED ORDER — HYDROCHLOROTHIAZIDE 25 MG PO TABS: 25.0000 mg | ORAL_TABLET | Freq: Every day | ORAL | 1 refills | Status: DC

## 2022-01-29 MED ORDER — GLIMEPIRIDE 4 MG PO TABS: 4.0000 mg | ORAL_TABLET | Freq: Two times a day (BID) | ORAL | 1 refills | Status: DC

## 2022-01-29 MED ORDER — DOXYCYCLINE HYCLATE 100 MG PO TABS: 100.0000 mg | ORAL_TABLET | Freq: Two times a day (BID) | ORAL | 0 refills | Status: AC

## 2022-01-29 MED ORDER — VITAMIN D (ERGOCALCIFEROL) 1.25 MG (50000 UNIT) PO CAPS: 50000.0000 [IU] | ORAL_CAPSULE | ORAL | 1 refills | Status: DC

## 2022-01-29 MED ORDER — PANTOPRAZOLE SODIUM 40 MG PO TBEC: 40.0000 mg | DELAYED_RELEASE_TABLET | Freq: Every day | ORAL | 1 refills | Status: DC

## 2022-01-29 NOTE — Progress Notes (Signed)
Assessment & Plan:  Maria Robinson was seen today for diabetes.  Diagnoses and all orders for this visit:  Type 2 diabetes mellitus with hyperglycemia, without long-term current use of insulin (HCC) -     Cancel: POCT glycosylated hemoglobin (Hb A1C) -     Cancel: POCT glucose (manual entry) -     metFORMIN (GLUCOPHAGE) 500 MG tablet; Take 1 tablet (500 mg total) by mouth 2 (two) times daily with a meal. -     Lancets (ONETOUCH DELICA PLUS [id number]) MISC; Use as instructed. Check blood glucose level by fingerstick twice per day. -     Discontinue: glimepiride (AMARYL) 4 MG tablet; Take 1 tablet (4 mg total) by mouth daily with breakfast. -     CMP14+EGFR -     Ambulatory referral to Ophthalmology -     glucose blood test strip; Use as instructed to check blood sugar daily. -     POCT glycosylated hemoglobin (Hb A1C) -     POCT glucose (manual entry) -     glimepiride (AMARYL) 4 MG tablet; Take 1 tablet (4 mg total) by mouth 2 (two) times daily.  Essential hypertension -     lisinopril (ZESTRIL) 40 MG tablet; Take 1 tablet (40 mg total) by mouth daily. -     hydrochlorothiazide (HYDRODIURIL) 25 MG tablet; Take 1 tablet (25 mg total) by mouth daily. -     amLODipine (NORVASC) 10 MG tablet; Take 1 tablet (10 mg total) by mouth daily.  Bacterial sinusitis -     doxycycline (VIBRA-TABS) 100 MG tablet; Take 1 tablet (100 mg total) by mouth 2 (two) times daily for 5 days.  Need for pneumococcal vaccination -     Pneumococcal conjugate vaccine 20-valent  Dyslipidemia, goal LDL below 70 -     atorvastatin (LIPITOR) 20 MG tablet; Take 1 tablet (20 mg total) by mouth daily. -     Lipid panel  Tinnitus of left ear -     Ambulatory referral to ENT  Breast cancer screening by mammogram -     MS DIGITAL SCREENING TOMO BILATERAL; Future  Microcytic anemia -     CBC  At risk for decreased bone density -     DG Bone Density; Future  Postmenopausal estrogen deficiency -     DG Bone Density;  Future  Right ear impacted cerumen  Vitamin D deficiency disease -     Vitamin D, Ergocalciferol, (DRISDOL) 1.25 MG (50000 UNIT) CAPS capsule; Take 1 capsule (50,000 Units total) by mouth every 7 (seven) days. -     VITAMIN D 25 Hydroxy (Vit-D Deficiency, Fractures)  GERD without esophagitis -     pantoprazole (PROTONIX) 40 MG tablet; Take 1 tablet (40 mg total) by mouth daily.  Need for shingles vaccine -     Zoster Vaccine Adjuvanted [facility]) injection; Inject 0.5 mLs into the muscle once for 1 dose.    Patient has been counseled on age-appropriate routine health concerns for screening and prevention. These are reviewed and up-to-date. Referrals have been placed accordingly. Immunizations are up-to-date or declined.    Subjective:   Chief Complaint  Patient presents with   Diabetes   HPI Maria Robinson 66 y.o. female presents to office today for follow up to DM and HTN. She has a past medical history of Allergy, Cataract, Diabetes mellitus without complication, Hypertension, Low blood potassium, and Sickle cell trait .   HTN Blood pressures well controlled with amlodipine 10 mg daily, hydrochlorothiazide 25 mg  daily and lisinopril 40 mg daily. BP Readings from Last 3 Encounters:  01/29/22 132/82  05/31/21 111/72  04/30/21 134/81    DM 2 Diabetes is not well controlled with goal A1c less than 7.  She cannot tolerate an increase in her metformin due to GI upset and is currently taking 500 mg twice daily and glimepiride 4 mg daily.  Will increase glimepiride to 4 mg twice daily at this time. Lab Results  Component Value Date   HGBA1C 7.8 (A) 01/29/2022   Sinus Pain: Patient complains of congestion, nasal congestion, non productive cough, post nasal drip, purulent nasal discharge, and sinus pressure. Onset of symptoms was several weeks ago, waxing and waning since that time. Patient is a non-smoker   Tinnitus Onset of symptoms was last year  with unchanged course since  that time. Patient describes the tinnitus as fluctuating and recurrent located in the left ear and sometimes right with L>R. The quality is described as medium range pitch that sounds like whooshing. The pattern is nonpulsatile. Patient describes her level of annoyance as moderately annoying, always aware. Associated symptoms include hearing loss Patient has had no prior evaluation, treatment or surgery for tinnitus Patient does not have hearing aids at this time. Previous treatments include none.  Review of Systems  Constitutional:  Negative for fever, malaise/fatigue and weight loss.  HENT:  Positive for congestion, hearing loss, sinus pain and tinnitus. Negative for nosebleeds.   Eyes: Negative.  Negative for blurred vision, double vision and photophobia.  Respiratory: Negative.  Negative for cough and shortness of breath.   Cardiovascular: Negative.  Negative for chest pain, palpitations and leg swelling.  Gastrointestinal:  Positive for heartburn. Negative for nausea and vomiting.  Musculoskeletal: Negative.  Negative for myalgias.  Neurological: Negative.  Negative for dizziness, focal weakness, seizures and headaches.  Psychiatric/Behavioral: Negative.  Negative for suicidal ideas.    Past Medical History:  Diagnosis Date   Allergy    Cataract    Diabetes mellitus without complication ([place])    Hypertension    Low blood potassium    Sickle cell trait [facility])     Past Surgical History:  Procedure Laterality Date   BIOPSY  06/13/2019   Procedure: BIOPSY;  Surgeon: Jackquline Denmark, MD;  Location: [facility];  Service: Endoscopy;;   CESAREAN SECTION     x 3   ESOPHAGOGASTRODUODENOSCOPY (EGD) WITH PROPOFOL N/A 06/13/2019   Procedure: ESOPHAGOGASTRODUODENOSCOPY (EGD) WITH PROPOFOL;  Surgeon: Jackquline Denmark, MD;  Location: [facility];  Service: Endoscopy;  Laterality: N/A;   HAND SURGERY Right    MALONEY DILATION  06/13/2019   Procedure: MALONEY DILATION;  Surgeon: Jackquline Denmark, MD;   Location: [facility];  Service: Endoscopy;;    Family History  Problem Relation Age of Onset   Diabetes Father    Colon cancer Neg Hx    Colon polyps Neg Hx    Esophageal cancer Neg Hx    Rectal cancer Neg Hx    Stomach cancer Neg Hx     Social History Reviewed with no changes to be made today.   Outpatient Medications Prior to Visit  Medication Sig Dispense Refill   Blood Glucose Monitoring Suppl (ONETOUCH VERIO REFLECT) w/Device KIT Use to check blood sugar daily. 1 kit 0   meloxicam (MOBIC) 15 MG tablet Take 1 tablet (15 mg total) by mouth daily. 30 tablet 1   amLODipine (NORVASC) 10 MG tablet Take 1 tablet (10 mg total) by mouth daily. 90 tablet 1  atorvastatin (LIPITOR) 20 MG tablet Take 1 tablet (20 mg total) by mouth daily. 90 tablet 3   glimepiride (AMARYL) 4 MG tablet TAKE 1 TABLET BY MOUTH ONCE A DAY BEFORE BREAKFAST (APPOINTMENT NEEDED FOR FUTURE REFILLS) 60 tablet 1   glucose blood test strip Use as instructed to check blood sugar daily. 100 each 2   hydrochlorothiazide (HYDRODIURIL) 25 MG tablet Take 1 tablet (25 mg total) by mouth daily. 90 tablet 0   Lancets (ONETOUCH DELICA PLUS [id number]) MISC USE AS INSTRUCTED TO CHECK BLOOD SUGAR DAILY. 100 each 1   lisinopril (ZESTRIL) 40 MG tablet TAKE 1 TABLET BY MOUTH EVERY DAY 30 tablet 0   metFORMIN (GLUCOPHAGE) 500 MG tablet Take 1 tablet (500 mg total) by mouth 2 (two) times daily with a meal. 180 tablet 3   pantoprazole (PROTONIX) 40 MG tablet Take 40 mg by mouth daily. (Patient not taking: Reported on 11/12/2020)     Vitamin D, Ergocalciferol, (DRISDOL) 1.25 MG (50000 UNIT) CAPS capsule Take 1 capsule (50,000 Units total) by mouth every 7 (seven) days. 12 capsule 1   No facility-administered medications prior to visit.    Allergies  Allergen Reactions   Penicillins Swelling and Rash    Did it involve swelling of the face/tongue/throat, SOB, or low BP? y Did it involve sudden or severe rash/hives, skin peeling, or  any reaction on the inside of your mouth or nose? y Did you need to seek medical attention at a hospital or doctor's office? y When did it last happen? 2026       If all above answers are NO, may proceed with cephalosporin use.       Objective:    BP 132/82    Pulse 94    Ht '4\' 11"'  (1.499 m)    Wt 174 lb 8 oz (79.2 kg)    SpO2 100%    BMI 35.24 kg/m  Wt Readings from Last 3 Encounters:  01/29/22 174 lb 8 oz (79.2 kg)  04/03/21 171 lb 6.4 oz (77.7 kg)  11/12/20 164 lb 6.4 oz (74.6 kg)    Physical Exam Vitals and nursing note reviewed.  Constitutional:      Appearance: She is well-developed.  HENT:     Head: Normocephalic and atraumatic.     Right Ear: Decreased hearing noted. There is impacted cerumen.     Left Ear: Hearing, tympanic membrane, ear canal and external ear normal.     Ears:     Comments: Post right ear irrigation: TM is easily viewed Cardiovascular:     Rate and Rhythm: Normal rate and regular rhythm.     Heart sounds: Normal heart sounds. No murmur heard.   No friction rub. No gallop.  Pulmonary:     Effort: Pulmonary effort is normal. No tachypnea or respiratory distress.     Breath sounds: Normal breath sounds. No decreased breath sounds, wheezing, rhonchi or rales.  Chest:     Chest wall: No tenderness.  Abdominal:     General: Bowel sounds are normal.     Palpations: Abdomen is soft.  Musculoskeletal:        General: Normal range of motion.     Cervical back: Normal range of motion.  Skin:    General: Skin is warm and dry.  Neurological:     Mental Status: She is alert and oriented to person, place, and time.     Coordination: Coordination normal.  Psychiatric:        Behavior:  Behavior normal. Behavior is cooperative.        Thought Content: Thought content normal.        Judgment: Judgment normal.         Patient has been counseled extensively about nutrition and exercise as well as the importance of adherence with medications and regular  follow-up. The patient was given clear instructions to go to ER or return to medical center if symptoms don't improve, worsen or new problems develop. The patient verbalized understanding.   Follow-up: Return in about 3 months (around 04/28/2022).   Gildardo Pounds, FNP-BC [facility] [place], [place]   01/29/2022, 12:32 PM

## 2022-01-30 LAB — CBC
Hematocrit: 32.6 % — ABNORMAL LOW (ref 34.0–46.6)
Hemoglobin: 10.4 g/dL — ABNORMAL LOW (ref 11.1–15.9)
MCH: 23.6 pg — ABNORMAL LOW (ref 26.6–33.0)
MCHC: 31.9 g/dL (ref 31.5–35.7)
MCV: 74 fL — ABNORMAL LOW (ref 79–97)
Platelets: 403 10*3/uL (ref 150–450)
RBC: 4.41 x10E6/uL (ref 3.77–5.28)
RDW: 15.5 % — ABNORMAL HIGH (ref 11.7–15.4)
WBC: 15.9 10*3/uL — ABNORMAL HIGH (ref 3.4–10.8)

## 2022-01-30 LAB — CMP14+EGFR
ALT: 23 IU/L (ref 0–32)
AST: 16 IU/L (ref 0–40)
Albumin/Globulin Ratio: 1.7 (ref 1.2–2.2)
Albumin: 4.9 g/dL — ABNORMAL HIGH (ref 3.8–4.8)
Alkaline Phosphatase: 115 IU/L (ref 44–121)
BUN/Creatinine Ratio: 24 (ref 12–28)
BUN: 29 mg/dL — ABNORMAL HIGH (ref 8–27)
Bilirubin Total: 0.2 mg/dL (ref 0.0–1.2)
CO2: 21 mmol/L (ref 20–29)
Calcium: 10.3 mg/dL (ref 8.7–10.3)
Chloride: 99 mmol/L (ref 96–106)
Creatinine, Ser: 1.19 mg/dL — ABNORMAL HIGH (ref 0.57–1.00)
Globulin, Total: 2.9 g/dL (ref 1.5–4.5)
Glucose: 138 mg/dL — ABNORMAL HIGH (ref 70–99)
Potassium: 4.5 mmol/L (ref 3.5–5.2)
Sodium: 138 mmol/L (ref 134–144)
Total Protein: 7.8 g/dL (ref 6.0–8.5)
eGFR: 51 mL/min/{1.73_m2} — ABNORMAL LOW (ref >59)

## 2022-01-30 LAB — LIPID PANEL
Chol/HDL Ratio: 4.2 ratio (ref 0.0–4.4)
Cholesterol, Total: 235 mg/dL — ABNORMAL HIGH (ref 100–199)
HDL: 56 mg/dL (ref >39)
LDL Chol Calc (NIH): 141 mg/dL — ABNORMAL HIGH (ref 0–99)
Triglycerides: 211 mg/dL — ABNORMAL HIGH (ref 0–149)
VLDL Cholesterol Cal: 38 mg/dL (ref 5–40)

## 2022-01-30 LAB — VITAMIN D 25 HYDROXY (VIT D DEFICIENCY, FRACTURES): Vit D, 25-Hydroxy: 27.7 ng/mL — ABNORMAL LOW (ref 30.0–100.0)

## 2022-02-03 ENCOUNTER — Telehealth: Payer: Self-pay

## 2022-02-03 NOTE — Telephone Encounter (Signed)
Left message to return call to our office.  

## 2022-02-04 ENCOUNTER — Telehealth: Payer: Self-pay | Admitting: *Deleted

## 2022-02-04 NOTE — Telephone Encounter (Signed)
Patient returned call- notified of lab results: Creatinine for kidney function is slightly elevated.  We will need to recheck in 2 weeks.  Needs lab appointment for this.  Cholesterol levels are elevated.  Make sure you are taking your cholesterol medication atorvastatin every day as prescribed as this helps reduce your risk of heart attack or stroke.  Blood work shows significant anemia compared to 2 years ago as well as elevated white blood count which could be related to a sinus infection.  Need her to return to the lab in 2 weeks to check these levels again  Lab appointment has been scheduled- patient needs orders

## 2022-02-07 ENCOUNTER — Telehealth: Payer: Self-pay

## 2022-02-07 NOTE — Telephone Encounter (Signed)
-----   Message from Gildardo Pounds, NP sent at 02/07/2022  8:37 AM EST ----- Creatinine for kidney function is slightly elevated.  We will need to recheck in 2 weeks.  Needs lab appointment for this.  Cholesterol levels are elevated.  Make sure you are taking your cholesterol medication atorvastatin every day as prescribed as this helps reduce your risk of heart attack or stroke.  Blood work shows significant anemia compared to 2 years ago as well as elevated white blood count which could be related to a sinus infection.  Need her to return to the lab in 2 weeks to check these levels again

## 2022-02-07 NOTE — Telephone Encounter (Signed)
Pt Is already aware of her results and appt was made

## 2022-02-12 DIAGNOSIS — H0288B Meibomian gland dysfunction left eye, upper and lower eyelids: Secondary | ICD-10-CM | POA: Diagnosis not present

## 2022-02-12 DIAGNOSIS — H04123 Dry eye syndrome of bilateral lacrimal glands: Secondary | ICD-10-CM | POA: Diagnosis not present

## 2022-02-12 DIAGNOSIS — H25813 Combined forms of age-related cataract, bilateral: Secondary | ICD-10-CM | POA: Diagnosis not present

## 2022-02-12 DIAGNOSIS — E119 Type 2 diabetes mellitus without complications: Secondary | ICD-10-CM | POA: Diagnosis not present

## 2022-02-12 DIAGNOSIS — H5213 Myopia, bilateral: Secondary | ICD-10-CM | POA: Diagnosis not present

## 2022-02-12 DIAGNOSIS — H524 Presbyopia: Secondary | ICD-10-CM | POA: Diagnosis not present

## 2022-02-12 DIAGNOSIS — H0288A Meibomian gland dysfunction right eye, upper and lower eyelids: Secondary | ICD-10-CM | POA: Diagnosis not present

## 2022-02-12 DIAGNOSIS — H52223 Regular astigmatism, bilateral: Secondary | ICD-10-CM | POA: Diagnosis not present

## 2022-02-12 LAB — HM DIABETES EYE EXAM

## 2022-02-19 ENCOUNTER — Other Ambulatory Visit: Payer: Self-pay

## 2022-02-19 ENCOUNTER — Other Ambulatory Visit: Payer: Self-pay | Admitting: Nurse Practitioner

## 2022-02-19 ENCOUNTER — Ambulatory Visit: Payer: Medicare HMO | Attending: Nurse Practitioner

## 2022-02-19 DIAGNOSIS — D72829 Elevated white blood cell count, unspecified: Secondary | ICD-10-CM | POA: Diagnosis not present

## 2022-02-20 LAB — CBC WITH DIFFERENTIAL/PLATELET
Basophils Absolute: 0.2 10*3/uL (ref 0.0–0.2)
Basos: 1 %
EOS (ABSOLUTE): 0.3 10*3/uL (ref 0.0–0.4)
Eos: 2 %
Hematocrit: 35.1 % (ref 34.0–46.6)
Hemoglobin: 10.6 g/dL — ABNORMAL LOW (ref 11.1–15.9)
Immature Grans (Abs): 0 10*3/uL (ref 0.0–0.1)
Immature Granulocytes: 0 %
Lymphocytes Absolute: 7.8 10*3/uL — ABNORMAL HIGH (ref 0.7–3.1)
Lymphs: 52 %
MCH: 22.7 pg — ABNORMAL LOW (ref 26.6–33.0)
MCHC: 30.2 g/dL — ABNORMAL LOW (ref 31.5–35.7)
MCV: 75 fL — ABNORMAL LOW (ref 79–97)
Monocytes Absolute: 1.1 10*3/uL — ABNORMAL HIGH (ref 0.1–0.9)
Monocytes: 7 %
Neutrophils Absolute: 5.8 10*3/uL (ref 1.4–7.0)
Neutrophils: 38 %
Platelets: 411 10*3/uL (ref 150–450)
RBC: 4.66 x10E6/uL (ref 3.77–5.28)
RDW: 15.4 % (ref 11.7–15.4)
WBC: 15.2 10*3/uL — ABNORMAL HIGH (ref 3.4–10.8)

## 2022-02-22 ENCOUNTER — Other Ambulatory Visit: Payer: Self-pay | Admitting: Nurse Practitioner

## 2022-02-22 DIAGNOSIS — D72829 Elevated white blood cell count, unspecified: Secondary | ICD-10-CM

## 2022-02-22 DIAGNOSIS — D649 Anemia, unspecified: Secondary | ICD-10-CM

## 2022-02-24 ENCOUNTER — Telehealth: Payer: Self-pay | Admitting: Hematology and Oncology

## 2022-02-24 ENCOUNTER — Telehealth: Payer: Self-pay

## 2022-02-24 NOTE — Telephone Encounter (Signed)
Pt. Given results and instructions. Verbalizes understanding. 

## 2022-02-24 NOTE — Telephone Encounter (Signed)
Scheduled appt per 3/4 referral. Called pt, no answer. Left msg with appt date and time.  ?

## 2022-02-24 NOTE — Telephone Encounter (Signed)
Left message to return call to our office.  

## 2022-03-11 NOTE — Progress Notes (Signed)
? ?Patient Care Team: ?Gildardo Pounds, NP as PCP - General (Nurse Practitioner) ? ?DIAGNOSIS:  ?Encounter Diagnosis  ?Name Primary?  ? Microcytic anemia   ? ?CHIEF COMPLIANT: Follow-up of leukocytosis and microcytic anemia ? ?INTERVAL HISTORY: Maria Robinson is a 66 yo with the above mention eukocytosis and microcytic anemia. She states that everything was going fine. She stated that her son past. She also state that she been having a lot of allergies. She states that her ears be stopped up and a lot of drainage that be causing her not feel good.  She is coming here after 2 years and reports that over the last several years she has continued to suffer from severe sinusitis and congestion. ? ?ALLERGIES:  is allergic to penicillins. ? ?MEDICATIONS:  ?Current Outpatient Medications  ?Medication Sig Dispense Refill  ? amLODipine (NORVASC) 10 MG tablet Take 1 tablet (10 mg total) by mouth daily. 90 tablet 1  ? atorvastatin (LIPITOR) 20 MG tablet Take 1 tablet (20 mg total) by mouth daily. 90 tablet 3  ? Blood Glucose Monitoring Suppl (ONETOUCH VERIO REFLECT) w/Device KIT Use to check blood sugar daily. 1 kit 0  ? glimepiride (AMARYL) 4 MG tablet Take 1 tablet (4 mg total) by mouth 2 (two) times daily. 180 tablet 1  ? glucose blood test strip Use as instructed to check blood sugar daily. 100 each 2  ? hydrochlorothiazide (HYDRODIURIL) 25 MG tablet Take 1 tablet (25 mg total) by mouth daily. 90 tablet 1  ? Lancets (ONETOUCH DELICA PLUS [id number]) MISC Use as instructed. Check blood glucose level by fingerstick twice per day. 100 each 1  ? lisinopril (ZESTRIL) 40 MG tablet Take 1 tablet (40 mg total) by mouth daily. 90 tablet 1  ? meloxicam (MOBIC) 15 MG tablet Take 1 tablet (15 mg total) by mouth daily. 30 tablet 1  ? metFORMIN (GLUCOPHAGE) 500 MG tablet Take 1 tablet (500 mg total) by mouth 2 (two) times daily with a meal. 180 tablet 3  ? pantoprazole (PROTONIX) 40 MG tablet Take 1 tablet (40 mg total) by mouth  daily. 90 tablet 1  ? Vitamin D, Ergocalciferol, (DRISDOL) 1.25 MG (50000 UNIT) CAPS capsule Take 1 capsule (50,000 Units total) by mouth every 7 (seven) days. 12 capsule 1  ? ?No current facility-administered medications for this visit.  ? ? ?PHYSICAL EXAMINATION: ?ECOG PERFORMANCE STATUS: 1 - Symptomatic but completely ambulatory ? ?Vitals:  ? 03/12/22 1052  ?BP: 117/61  ?Pulse: 86  ?Resp: 18  ?Temp: 97.9 ?F (36.6 ?C)  ?SpO2: 100%  ? ?Filed Weights  ? 03/12/22 1052  ?Weight: 171 lb 4.8 oz (77.7 kg)  ? ?  ? ?LABORATORY DATA:  ?I have reviewed the data as listed ? ?  Latest Ref Rng & Units 01/29/2022  ? 10:40 AM 05/31/2021  ?  9:03 AM 03/08/2021  ? 10:51 AM  ?CMP  ?Glucose 70 - 99 mg/dL 138   125   112    ?BUN 8 - 27 mg/dL _0 ?Creatinine 0.57 - 1.00 mg/dL 1.19   1.09   0.84    ?Sodium 134 - 144 mmol/L 138   140   138    ?Potassium 3.5 - 5.2 mmol/L 4.5   4.7   4.4    ?Chloride 96 - 106 mmol/L 99   101   99    ?CO2 20 - 29 mmol/L _1 ?  Calcium 8.7 - 10.3 mg/dL 10.3   9.8   9.6    ?Total Protein 6.0 - 8.5 g/dL 7.8    7.7    ?Total Bilirubin 0.0 - 1.2 mg/dL 0.2    0.4    ?Alkaline Phos 44 - 121 IU/L 115    109    ?AST 0 - 40 IU/L 16    23    ?ALT 0 - 32 IU/L 23    25    ? ? ?Lab Results  ?Component Value Date  ? WBC 15.2 (H) 02/19/2022  ? HGB 10.6 (L) 02/19/2022  ? HCT 35.1 02/19/2022  ? MCV 75 (L) 02/19/2022  ? PLT 411 02/19/2022  ? NEUTROABS 5.8 02/19/2022  ? ? ?ASSESSMENT & PLAN:  ?Microcytic anemia ?Peripheral blood flow cytometry 06/13/2019: No monoclonal B-cell population identified.  Predominance of T cells with relative abundance of CD8 positive cells.  Nondiagnostic for any lymphoproliferative process. ?  ?Differential diagnosis: Secondary to current illness/infection ? ?Microcytic anemia ?With a prior history of sickle cell trait. ?There is no current evidence of anemia. ?She may also have beta thalassemia ?02/19/2022: WBC 15.2, hemoglobin 10.6, MCV 75, ANC 5.8, ALC 7.8 ?Continued signs and  symptoms of inflammation with upper respiratory symptoms of sinus congestion.  She has had chronic sinusitis.  I recommended that she see ENT for consultation. ? ?There is no underlying bone marrow disorder to explain the elevated white blood cell count.  It is related to underlying inflammation. ?Return to clinic on an as-needed basis. ? ? ? ?No orders of the defined types were placed in this encounter. ? ?The patient has a good understanding of the overall plan. she agrees with it. she will call with any problems that may develop before the next visit here. ?Total time spent: 30 mins including face to face time and time spent for planning, charting and co-ordination of care ? ? Harriette Ohara, MD ?03/12/22 ? ? ? I Gardiner Coins am scribing for Dr. Lindi Adie ? ?I have reviewed the above documentation for accuracy and completeness, and I agree with the above. ?. ?

## 2022-03-12 ENCOUNTER — Inpatient Hospital Stay: Payer: Medicare HMO | Attending: Hematology and Oncology | Admitting: Hematology and Oncology

## 2022-03-12 ENCOUNTER — Other Ambulatory Visit: Payer: Self-pay

## 2022-03-12 DIAGNOSIS — D509 Iron deficiency anemia, unspecified: Secondary | ICD-10-CM

## 2022-03-12 DIAGNOSIS — D72829 Elevated white blood cell count, unspecified: Secondary | ICD-10-CM | POA: Insufficient documentation

## 2022-03-12 DIAGNOSIS — Z880 Allergy status to penicillin: Secondary | ICD-10-CM | POA: Insufficient documentation

## 2022-03-12 DIAGNOSIS — Z79899 Other long term (current) drug therapy: Secondary | ICD-10-CM | POA: Diagnosis not present

## 2022-03-12 NOTE — Assessment & Plan Note (Addendum)
Peripheral blood flow cytometry 06/13/2019: No monoclonal B-cell population identified. ?Predominance of T cells with relative abundance of CD8 positive cells. ?Nondiagnostic for any lymphoproliferative process. ?? ?Differential diagnosis: Secondary to current illness/infection ? ?Microcytic anemia ?With a prior history of sickle cell trait. ?There is no current evidence of anemia. ?She may also have beta thalassemia ?02/19/2022: WBC 15.2, hemoglobin 10.6, MCV 75, ANC 5.8, ALC 7.8 ?Continued signs and symptoms of inflammation with upper respiratory symptoms of sinus congestion.  She has had chronic sinusitis.  I recommended that she see ENT for consultation. ? ?There is no underlying bone marrow disorder to explain the elevated white blood cell count.  It is related to underlying inflammation. ?Return to clinic on an as-needed basis. ?

## 2022-04-09 ENCOUNTER — Telehealth: Payer: Self-pay | Admitting: Nurse Practitioner

## 2022-04-09 NOTE — Telephone Encounter (Signed)
No notes as to who called patient.  ?Patient declines needing anything at this time.  ?

## 2022-04-09 NOTE — Telephone Encounter (Signed)
Copied from [place] [phone]. Topic: General - Other ?>> Apr 08, 2022  4:36 PM Tessa Lerner A wrote: ?Reason for CRM: The patient has returned a missed call from the practice ?There were no new notes or encounters at the time of returned call ?Please contact further when possible. ?

## 2022-05-18 ENCOUNTER — Other Ambulatory Visit: Payer: Self-pay

## 2022-05-18 ENCOUNTER — Emergency Department (HOSPITAL_BASED_OUTPATIENT_CLINIC_OR_DEPARTMENT_OTHER)
Admission: EM | Admit: 2022-05-18 | Discharge: 2022-05-18 | Disposition: A | Discharge status: Home / Self Care | Payer: Medicare HMO | Attending: Emergency Medicine | Admitting: Emergency Medicine

## 2022-05-18 ENCOUNTER — Encounter (HOSPITAL_BASED_OUTPATIENT_CLINIC_OR_DEPARTMENT_OTHER): Payer: Self-pay | Admitting: Emergency Medicine

## 2022-05-18 DIAGNOSIS — K0889 Other specified disorders of teeth and supporting structures: Secondary | ICD-10-CM | POA: Diagnosis not present

## 2022-05-18 MED ORDER — CLINDAMYCIN HCL 300 MG PO CAPS: 300.0000 mg | ORAL_CAPSULE | Freq: Three times a day (TID) | ORAL | 0 refills | Status: AC

## 2022-05-18 MED ORDER — IBUPROFEN 800 MG PO TABS: 800.0000 mg | ORAL_TABLET | Freq: Three times a day (TID) | ORAL | 0 refills | Status: AC | PRN

## 2022-05-18 NOTE — ED Provider Notes (Signed)
[facility] EMERGENCY DEPARTMENT Provider Note   CSN: [id number] Arrival date & time: 05/18/22  7948     History  Chief Complaint  Patient presents with   Dental Pain    Maria Robinson is a 66 y.o. female.  Pt is scheduled to see her dentist on Tuesday.  Pt reports she has a crack in her tooth   The history is provided by the patient. No language interpreter was used.  Dental Pain Location:  Upper Upper teeth location:  10/LU lateral incisor Quality:  Aching Severity:  Moderate Onset quality:  Gradual Duration:  3 days Timing:  Constant Progression:  Worsening Chronicity:  New Context: not abscess   Worsened by:  Nothing Ineffective treatments:  None tried Associated symptoms: facial pain and facial swelling       Home Medications Prior to Admission medications   Medication Sig Start Date End Date Taking? Authorizing Provider  amLODipine (NORVASC) 10 MG tablet Take 1 tablet (10 mg total) by mouth daily. 01/29/22  Yes Gildardo Pounds, NP  atorvastatin (LIPITOR) 20 MG tablet Take 1 tablet (20 mg total) by mouth daily. 01/29/22  Yes Gildardo Pounds, NP  Blood Glucose Monitoring Suppl (ONETOUCH VERIO REFLECT) w/Device KIT Use to check blood sugar daily. 06/29/20  Yes Charlott Rakes, MD  clindamycin (CLEOCIN) 300 MG capsule Take 1 capsule (300 mg total) by mouth 3 (three) times daily for 10 days. 05/18/22 05/28/22 Yes Caryl Ada K, PA-C  glucose blood test strip Use as instructed to check blood sugar daily. 01/29/22  Yes Gildardo Pounds, NP  hydrochlorothiazide (HYDRODIURIL) 25 MG tablet Take 1 tablet (25 mg total) by mouth daily. 01/29/22  Yes Gildardo Pounds, NP  ibuprofen (ADVIL) 800 MG tablet Take 1 tablet (800 mg total) by mouth every 8 (eight) hours as needed. 05/18/22  Yes Caryl Ada K, PA-C  Lancets (ONETOUCH DELICA PLUS [id number]) MISC Use as instructed. Check blood glucose level by fingerstick twice per day. 01/29/22  Yes Gildardo Pounds, NP  meloxicam  (MOBIC) 15 MG tablet Take 1 tablet (15 mg total) by mouth daily. 09/07/20  Yes Gildardo Pounds, NP  metFORMIN (GLUCOPHAGE) 500 MG tablet Take 1 tablet (500 mg total) by mouth 2 (two) times daily with a meal. 01/29/22  Yes Gildardo Pounds, NP  glimepiride (AMARYL) 4 MG tablet Take 1 tablet (4 mg total) by mouth 2 (two) times daily. 01/29/22 04/29/22  Gildardo Pounds, NP  lisinopril (ZESTRIL) 40 MG tablet Take 1 tablet (40 mg total) by mouth daily. 01/29/22 04/29/22  Gildardo Pounds, NP  pantoprazole (PROTONIX) 40 MG tablet Take 1 tablet (40 mg total) by mouth daily. 01/29/22 04/29/22  Gildardo Pounds, NP  Vitamin D, Ergocalciferol, (DRISDOL) 1.25 MG (50000 UNIT) CAPS capsule Take 1 capsule (50,000 Units total) by mouth every 7 (seven) days. 01/29/22   Gildardo Pounds, NP      Allergies    Penicillins    Review of Systems   Review of Systems  HENT:  Positive for facial swelling.   All other systems reviewed and are negative.  Physical Exam Updated Vital Signs BP 131/80 (BP Location: Right Arm)   Pulse 60   Temp 98.7 F (37.1 C) (Oral)   Resp 18   Ht _0  (1.499 m)   SpO2 100%   BMI 34.60 kg/m  Physical Exam Vitals and nursing note reviewed.  Constitutional:      Appearance: She is well-developed.  HENT:  Head: Normocephalic.     Comments: Swollen left face     Mouth/Throat:     Mouth: Mucous membranes are moist.     Comments: Swollen left gumline Pulmonary:     Effort: Pulmonary effort is normal.  Musculoskeletal:        General: Normal range of motion.     Cervical back: Normal range of motion.  Skin:    General: Skin is warm.  Neurological:     Mental Status: She is alert and oriented to person, place, and time.  Psychiatric:        Mood and Affect: Mood normal.    ED Results / Procedures / Treatments   Labs (all labs ordered are listed, but only abnormal results are displayed) Labs Reviewed - No data to display  EKG None  Radiology No results  found.  Procedures Procedures    Medications Ordered in ED Medications - No data to display  ED Course/ Medical Decision Making/ A&P                           Medical Decision Making Risk Prescription drug management.   Patient has swelling to her left gum into the left side of her face patient is allergic to penicillin patient request ibuprofen 800 mg for pain.  Patient is given a prescription for clindamycin and ibuprofen she is advised to follow-up with her dentist on Tuesday as scheduled        Final Clinical Impression(s) / ED Diagnoses Final diagnoses:  Toothache    Rx / DC Orders ED Discharge Orders          Ordered    clindamycin (CLEOCIN) 300 MG capsule  3 times daily        05/18/22 1041    ibuprofen (ADVIL) 800 MG tablet  Every 8 hours PRN        05/18/22 1041           An After Visit Summary was printed and given to the patient.    Fransico Meadow, [place] 05/18/22 [place], Samuel A, DO 05/18/22 1600

## 2022-05-18 NOTE — Discharge Instructions (Addendum)
See your Dentist as scheduled on Tuesday

## 2022-05-18 NOTE — ED Triage Notes (Signed)
Patient states that she has an appointment on tuesday with the dentist . Due to toothache. States that the tooth has an abscess and that it is causing her face to swell on the left side. States dentist told her to come in to be evaluated. Denies any issues with shortness of breath . Or visual changes.

## 2022-06-27 IMAGING — MG DIGITAL SCREENING BILAT W/ TOMO W/ CAD
8 series · 8 of 24 positions shown · non-contrast
Comparison: None.

ACR Breast Density Category a: The breast tissue is almost entirely
fatty.

CLINICAL DATA: Screening.

EXAM:
DIGITAL SCREENING BILATERAL MAMMOGRAM WITH TOMO AND CAD

[R CC synth-2D]
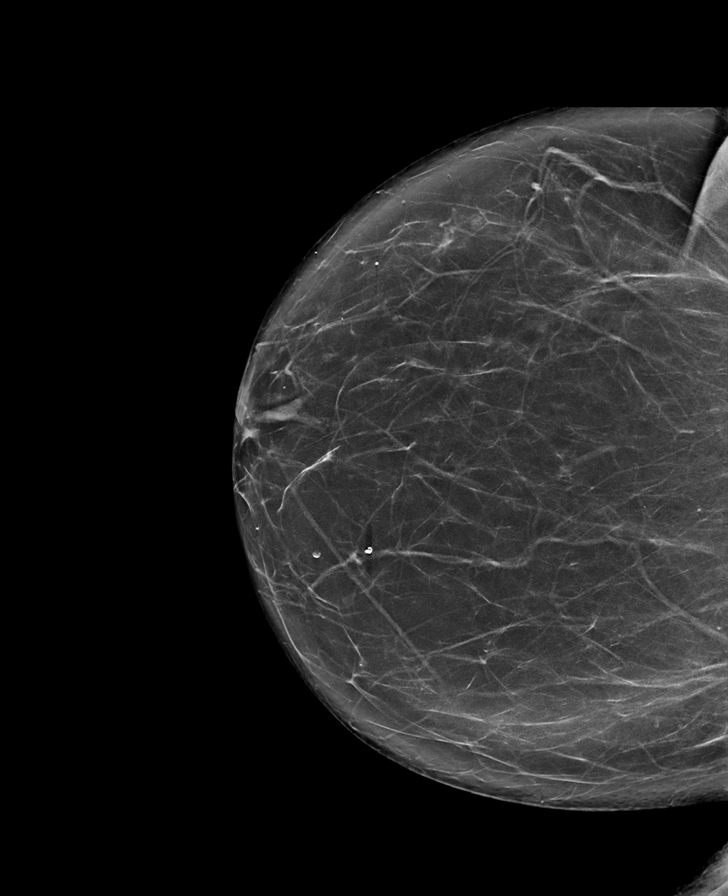

[L MLO synth-2D]
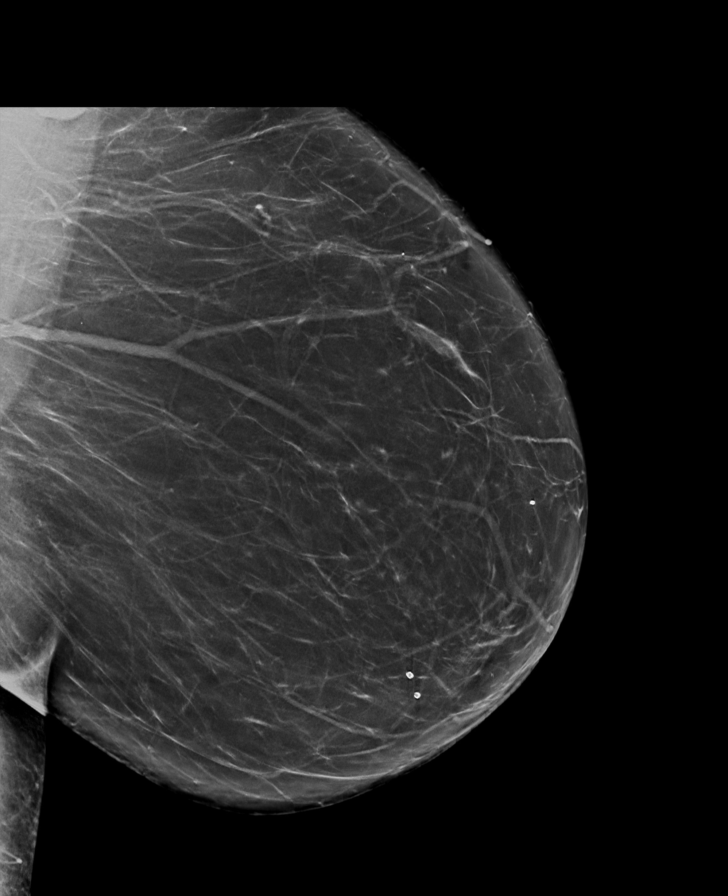

[R MLO synth-2D]
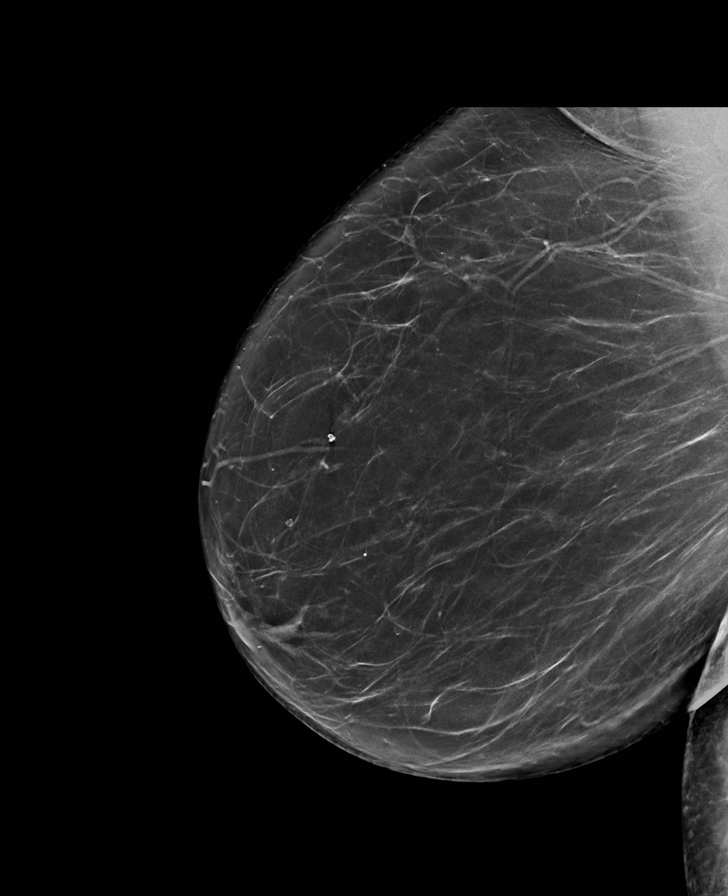

[L CC synth-2D]
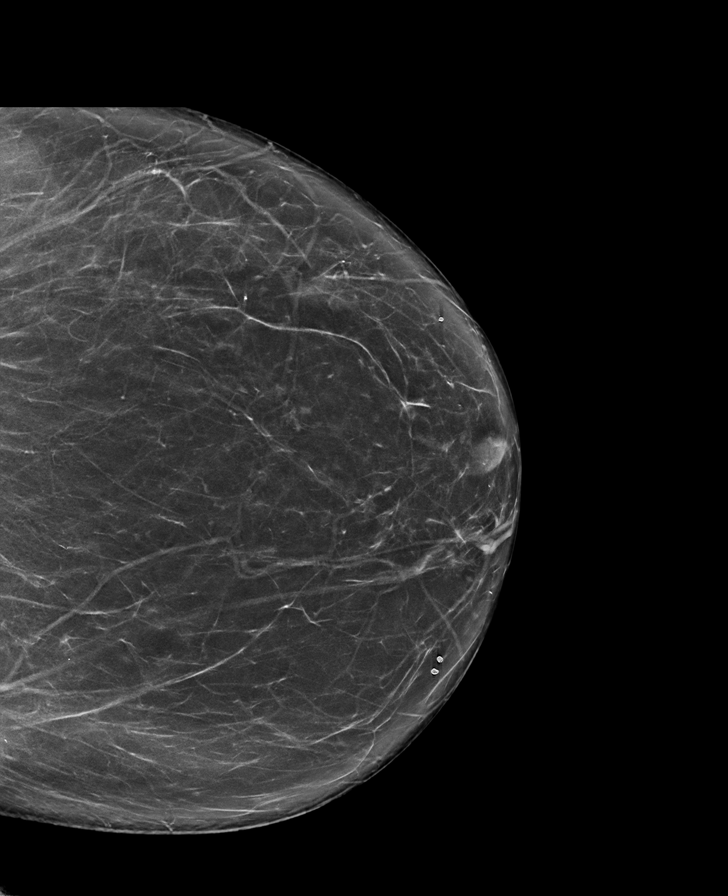

[R MLO tomo · tomo slice 45/88.0]
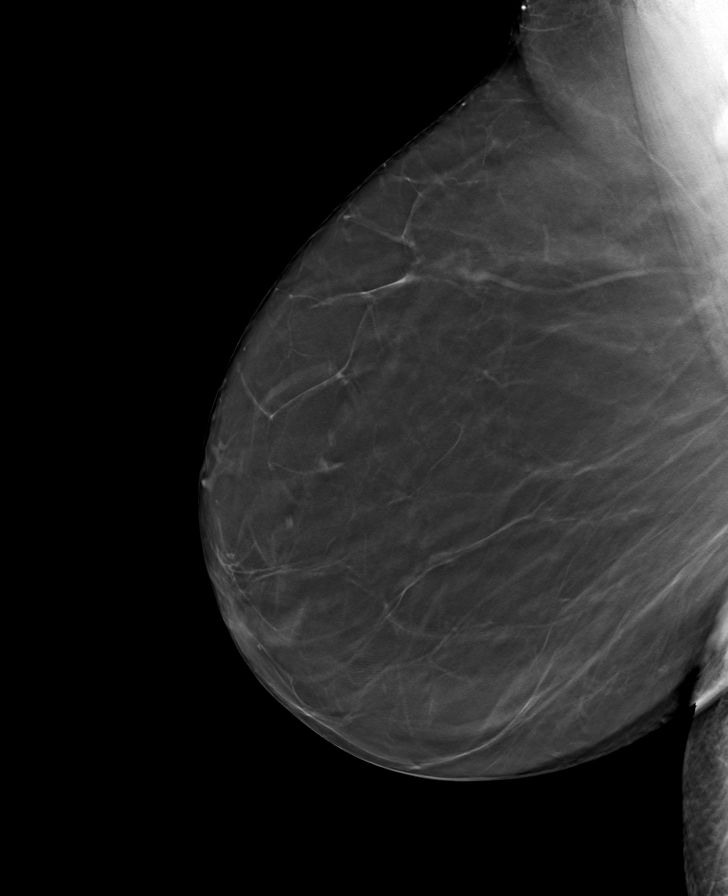

[L CC tomo · tomo slice 42/83.0]
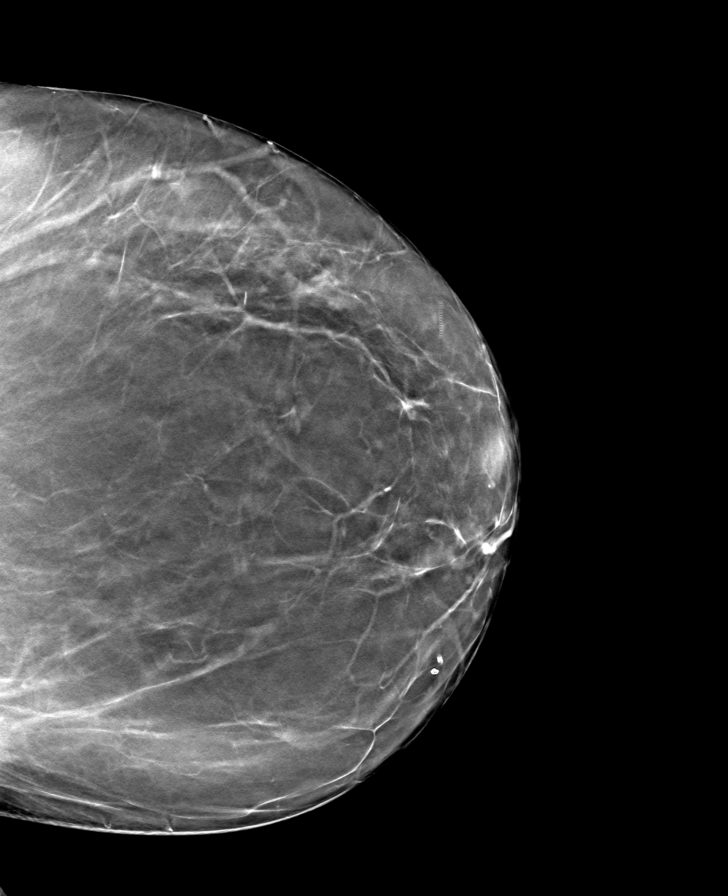

[R CC tomo · tomo slice 41/81.0]
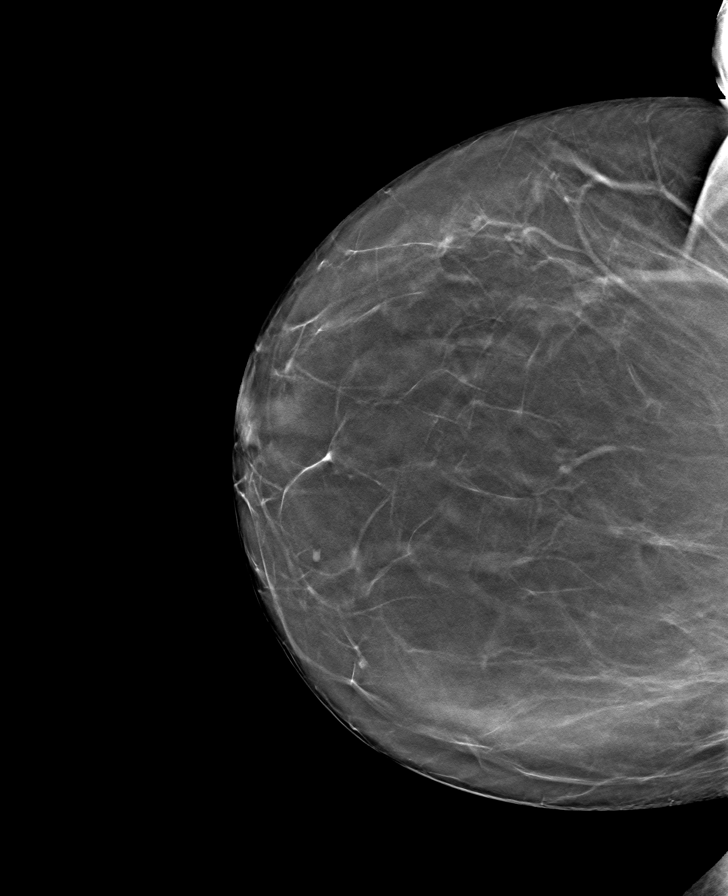

[L MLO tomo · tomo slice 45/88.0]
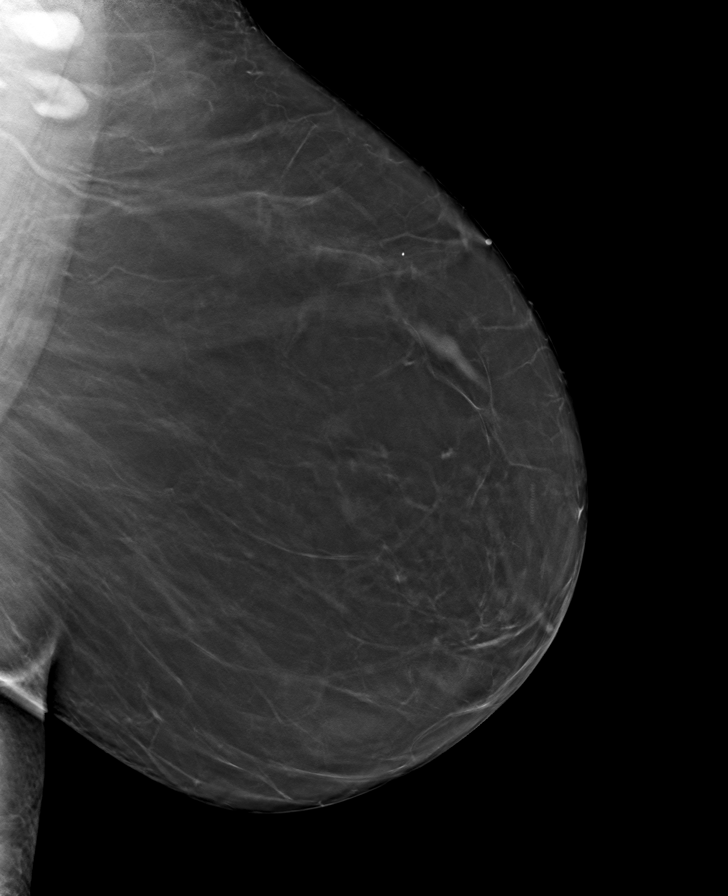

[8 of 24 positions shown; findings below may reference images not displayed]

FINDINGS: There are no findings suspicious for malignancy. Images were
processed with CAD.
IMPRESSION: No mammographic evidence of malignancy. A result letter of this
screening mammogram will be mailed directly to the patient.

RECOMMENDATION:
Screening mammogram in one year. (Code:[id number])

BI-RADS CATEGORY  1: Negative.

## 2022-07-20 ENCOUNTER — Other Ambulatory Visit: Payer: Self-pay | Admitting: Nurse Practitioner

## 2022-07-20 DIAGNOSIS — K219 Gastro-esophageal reflux disease without esophagitis: Secondary | ICD-10-CM

## 2022-07-20 DIAGNOSIS — E559 Vitamin D deficiency, unspecified: Secondary | ICD-10-CM

## 2022-07-22 DIAGNOSIS — R69 Illness, unspecified: Secondary | ICD-10-CM | POA: Diagnosis not present

## 2022-07-22 NOTE — Telephone Encounter (Signed)
Requested medication (s) are due for refill today - provider review   Requested medication (s) are on the active medication list -yes  Future visit scheduled -no  Last refill: 01/29/22 #12 1RF  Notes to clinic: high dose Rx- provider review   Requested Prescriptions  Pending Prescriptions Disp Refills   Vitamin D, Ergocalciferol, (DRISDOL) 1.25 MG (50000 UNIT) CAPS capsule [Pharmacy Med Name: VITAMIN D2 1.'25MG'$ (50,000 UNIT)] 12 capsule 1    Sig: TAKE 1 CAPSULE (50,000 UNITS TOTAL) BY MOUTH EVERY 7 (SEVEN) DAYS     Endocrinology:  Vitamins - Vitamin D Supplementation 2 Failed - 07/20/2022 10:11 AM      Failed - Manual Review: Route requests for 50,000 IU strength to the provider      Failed - Vitamin D in normal range and within 360 days    Vit D, 25-Hydroxy  Date Value Ref Range Status  01/29/2022 27.7 (L) 30.0 - 100.0 ng/mL Final    Comment:    Vitamin D deficiency has been defined by the [place] practice guideline as a level of serum 25-OH vitamin D less than 20 ng/mL (1,2). The Endocrine Society went on to further define vitamin D insufficiency as a level between 21 and 29 ng/mL (2). 1. IOM (Institute of Medicine). 2010. Dietary reference    intakes for calcium and D. [place]: The    [employer]. 2. Holick MF, Binkley [place], Bischoff-Ferrari HA, et al.    Evaluation, treatment, and prevention of vitamin D    deficiency: an Endocrine Society clinical practice    guideline. JCEM. 2011 Jul; 96(7):1911-30.          Passed - Ca in normal range and within 360 days    Calcium  Date Value Ref Range Status  01/29/2022 10.3 8.7 - 10.3 mg/dL Final         Passed - Valid encounter within last 12 months    Recent Outpatient Visits           5 months ago Type 2 diabetes mellitus with hyperglycemia, without long-term current use of insulin [facility])   [place], Vernia Buff, NP   1 year ago  Essential hypertension   [place], Jarome Matin, RPH-CPP   1 year ago Essential hypertension   [place], Jarome Matin, RPH-CPP   1 year ago Essential hypertension   [place], [place] W, NP   1 year ago Type 2 diabetes mellitus with hyperglycemia, without long-term current use of insulin [facility])   [place], [place] W, NP              Signed Prescriptions Disp Refills   pantoprazole (PROTONIX) 40 MG tablet 90 tablet 1    Sig: TAKE 1 TABLET BY MOUTH EVERY DAY     Gastroenterology: Proton Pump Inhibitors Passed - 07/20/2022 10:11 AM      Passed - Valid encounter within last 12 months    Recent Outpatient Visits           5 months ago Type 2 diabetes mellitus with hyperglycemia, without long-term current use of insulin [facility])   [place], Vernia Buff, NP   1 year ago Essential hypertension   [place], RPH-CPP   1 year ago  Essential hypertension   [place], RPH-CPP   1 year ago Essential hypertension   [place], [place] W, NP   1 year ago Type 2 diabetes mellitus with hyperglycemia, without long-term current use of insulin ([place])   [place], Vernia Buff, NP                 Requested Prescriptions  Pending Prescriptions Disp Refills   Vitamin D, Ergocalciferol, (DRISDOL) 1.25 MG (50000 UNIT) CAPS capsule [Pharmacy Med Name: VITAMIN D2 1.'25MG'$ (50,000 UNIT)] 12 capsule 1    Sig: TAKE 1 CAPSULE (50,000 UNITS TOTAL) BY MOUTH EVERY 7 (SEVEN) DAYS     Endocrinology:  Vitamins - Vitamin D Supplementation 2 Failed - 07/20/2022 10:11 AM      Failed - Manual Review: Route requests for 50,000 IU strength to the  provider      Failed - Vitamin D in normal range and within 360 days    Vit D, 25-Hydroxy  Date Value Ref Range Status  01/29/2022 27.7 (L) 30.0 - 100.0 ng/mL Final    Comment:    Vitamin D deficiency has been defined by the Institute of Medicine and an Endocrine Society practice guideline as a level of serum 25-OH vitamin D less than 20 ng/mL (1,2). The Endocrine Society went on to further define vitamin D insufficiency as a level between 21 and 29 ng/mL (2). 1. IOM (Institute of Medicine). 2010. Dietary reference    intakes for calcium and D. [place]: The    [employer]. 2. Holick MF, Binkley [place], Bischoff-Ferrari HA, et al.    Evaluation, treatment, and prevention of vitamin D    deficiency: an Endocrine Society clinical practice    guideline. JCEM. 2011 Jul; 96(7):1911-30.          Passed - Ca in normal range and within 360 days    Calcium  Date Value Ref Range Status  01/29/2022 10.3 8.7 - 10.3 mg/dL Final         Passed - Valid encounter within last 12 months    Recent Outpatient Visits           5 months ago Type 2 diabetes mellitus with hyperglycemia, without long-term current use of insulin [facility])   [place], Vernia Buff, NP   1 year ago Essential hypertension   [place], Stephen L, RPH-CPP   1 year ago Essential hypertension   [place], Stephen L, RPH-CPP   1 year ago Essential hypertension   [place], [place] W, NP   1 year ago Type 2 diabetes mellitus with hyperglycemia, without long-term current use of insulin [facility])   [place], Vernia Buff, NP              Signed Prescriptions Disp Refills   pantoprazole (PROTONIX) 40 MG tablet 90 tablet 1    Sig: TAKE 1 TABLET BY MOUTH EVERY DAY     Gastroenterology: Proton Pump Inhibitors Passed -  07/20/2022 10:11 AM      Passed - Valid encounter within last 12 months    Recent Outpatient Visits           5 months ago Type 2 diabetes mellitus with hyperglycemia, without long-term current use of insulin ([place])  [place] Gildardo Pounds, NP   1 year ago Essential hypertension   [place], Jarome Matin, RPH-CPP   1 year ago Essential hypertension   [place], Jarome Matin, RPH-CPP   1 year ago Essential hypertension   [place], [place] W, NP   1 year ago Type 2 diabetes mellitus with hyperglycemia, without long-term current use of insulin [facility])   [place], Vernia Buff, NP

## 2022-07-22 NOTE — Telephone Encounter (Signed)
Requested Prescriptions  Pending Prescriptions Disp Refills  . Vitamin D, Ergocalciferol, (DRISDOL) 1.25 MG (50000 UNIT) CAPS capsule [Pharmacy Med Name: VITAMIN D2 1.'25MG'$ (50,000 UNIT)] 12 capsule 1    Sig: TAKE 1 CAPSULE (50,000 UNITS TOTAL) BY MOUTH EVERY 7 (SEVEN) DAYS     Endocrinology:  Vitamins - Vitamin D Supplementation 2 Failed - 07/20/2022 10:11 AM      Failed - Manual Review: Route requests for 50,000 IU strength to the provider      Failed - Vitamin D in normal range and within 360 days    Vit D, 25-Hydroxy  Date Value Ref Range Status  01/29/2022 27.7 (L) 30.0 - 100.0 ng/mL Final    Comment:    Vitamin D deficiency has been defined by the Institute of Medicine and an Endocrine Society practice guideline as a level of serum 25-OH vitamin D less than 20 ng/mL (1,2). The Endocrine Society went on to further define vitamin D insufficiency as a level between 21 and 29 ng/mL (2). 1. IOM (Institute of Medicine). 2010. Dietary reference    intakes for calcium and D. [place]: The    [employer]. 2. Holick MF, Binkley [place], Bischoff-Ferrari HA, et al.    Evaluation, treatment, and prevention of vitamin D    deficiency: an Endocrine Society clinical practice    guideline. JCEM. 2011 Jul; 96(7):1911-30.          Passed - Ca in normal range and within 360 days    Calcium  Date Value Ref Range Status  01/29/2022 10.3 8.7 - 10.3 mg/dL Final         Passed - Valid encounter within last 12 months    Recent Outpatient Visits          5 months ago Type 2 diabetes mellitus with hyperglycemia, without long-term current use of insulin [facility])   [place] [employer], Vernia Buff, NP   1 year ago Essential hypertension   [place], RPH-CPP   1 year ago Essential hypertension   [place], Stephen L, RPH-CPP   1 year ago Essential hypertension    [place], [place] W, NP   1 year ago Type 2 diabetes mellitus with hyperglycemia, without long-term current use of insulin [facility])   [place], [place] W, NP             . pantoprazole (PROTONIX) 40 MG tablet [Pharmacy Med Name: PANTOPRAZOLE SOD DR 40 MG TAB] 90 tablet 1    Sig: TAKE 1 TABLET BY MOUTH EVERY DAY     Gastroenterology: Proton Pump Inhibitors Passed - 07/20/2022 10:11 AM      Passed - Valid encounter within last 12 months    Recent Outpatient Visits          5 months ago Type 2 diabetes mellitus with hyperglycemia, without long-term current use of insulin [facility])   [place], Vernia Buff, NP   1 year ago Essential hypertension   [place], Jarome Matin, RPH-CPP   1 year ago Essential hypertension   [place], Jarome Matin, RPH-CPP   1 year ago Essential hypertension   [place], [place] W, NP   1 year ago Type 2 diabetes mellitus with  hyperglycemia, without long-term current use of insulin [facility])   [place], Vernia Buff, NP

## 2022-08-04 DIAGNOSIS — H93293 Other abnormal auditory perceptions, bilateral: Secondary | ICD-10-CM | POA: Diagnosis not present

## 2022-08-04 DIAGNOSIS — H9193 Unspecified hearing loss, bilateral: Secondary | ICD-10-CM | POA: Insufficient documentation

## 2022-08-04 DIAGNOSIS — H6123 Impacted cerumen, bilateral: Secondary | ICD-10-CM | POA: Insufficient documentation

## 2022-08-06 ENCOUNTER — Other Ambulatory Visit: Payer: Self-pay | Admitting: Nurse Practitioner

## 2022-08-06 DIAGNOSIS — E559 Vitamin D deficiency, unspecified: Secondary | ICD-10-CM

## 2022-08-06 NOTE — Telephone Encounter (Signed)
Medication Refill - Medication: Vitamin D, Ergocalciferol, (DRISDOL) 1.25 MG (50000 UNIT) CAPS capsule   Has the patient contacted their pharmacy? yes (Agent: If no, request that the patient contact the pharmacy for the refill. If patient does not wish to contact the pharmacy document the reason why and proceed with request.) (Agent: If yes, when and what did the pharmacy advise?)  Preferred Pharmacy (with phone number or street name):  [id number] [place], [place] - [address]:  [phone] Fax:  [phone]    Has the patient been seen for an appointment in the last year OR does the patient have an upcoming appointment? yes  Agent: Please be advised that RX refills may take up to 3 business days. We ask that you follow-up with your pharmacy.

## 2022-08-07 MED ORDER — VITAMIN D (ERGOCALCIFEROL) 1.25 MG (50000 UNIT) PO CAPS: 50000.0000 [IU] | ORAL_CAPSULE | ORAL | 1 refills | Status: DC

## 2022-08-07 NOTE — Telephone Encounter (Signed)
Requested medication (s) are due for refill today:   Provider to review  Requested medication (s) are on the active medication list:   Yes  Future visit scheduled:   Yes in 5 days   Last ordered: 01/29/2022 #12, 1 refill  Non delegated refill reason returned   Requested Prescriptions  Pending Prescriptions Disp Refills   Vitamin D, Ergocalciferol, (DRISDOL) 1.25 MG (50000 UNIT) CAPS capsule 12 capsule 1    Sig: Take 1 capsule (50,000 Units total) by mouth every 7 (seven) days.     Endocrinology:  Vitamins - Vitamin D Supplementation 2 Failed - 08/06/2022  3:17 PM      Failed - Manual Review: Route requests for 50,000 IU strength to the provider      Failed - Vitamin D in normal range and within 360 days    Vit D, 25-Hydroxy  Date Value Ref Range Status  01/29/2022 27.7 (L) 30.0 - 100.0 ng/mL Final    Comment:    Vitamin D deficiency has been defined by the [place] practice guideline as a level of serum 25-OH vitamin D less than 20 ng/mL (1,2). The Endocrine Society went on to further define vitamin D insufficiency as a level between 21 and 29 ng/mL (2). 1. IOM (Institute of Medicine). 2010. Dietary reference    intakes for calcium and D. [place]: The    [employer]. 2. Holick MF, Binkley , Bischoff-Ferrari HA, et al.    Evaluation, treatment, and prevention of vitamin D    deficiency: an Endocrine Society clinical practice    guideline. JCEM. 2011 Jul; 96(7):1911-30.          Passed - Ca in normal range and within 360 days    Calcium  Date Value Ref Range Status  01/29/2022 10.3 8.7 - 10.3 mg/dL Final         Passed - Valid encounter within last 12 months    Recent Outpatient Visits           6 months ago Type 2 diabetes mellitus with hyperglycemia, without long-term current use of insulin [facility])   [place], Vernia Buff, NP   1 year ago Essential hypertension   [place], RPH-CPP   1 year ago Essential hypertension   [place], Jarome Matin, RPH-CPP   1 year ago Essential hypertension   [place], [place] W, NP   1 year ago Type 2 diabetes mellitus with hyperglycemia, without long-term current use of insulin [facility])   [place], Vernia Buff, NP       Future Appointments             In 5 days Gildardo Pounds, NP [place]

## 2022-08-08 ENCOUNTER — Other Ambulatory Visit: Payer: Self-pay | Admitting: Nurse Practitioner

## 2022-08-08 DIAGNOSIS — I10 Essential (primary) hypertension: Secondary | ICD-10-CM

## 2022-08-12 ENCOUNTER — Telehealth (HOSPITAL_BASED_OUTPATIENT_CLINIC_OR_DEPARTMENT_OTHER): Payer: Medicare HMO | Admitting: Nurse Practitioner

## 2022-08-12 ENCOUNTER — Encounter: Payer: Self-pay | Admitting: Nurse Practitioner

## 2022-08-12 DIAGNOSIS — E559 Vitamin D deficiency, unspecified: Secondary | ICD-10-CM

## 2022-08-12 DIAGNOSIS — I10 Essential (primary) hypertension: Secondary | ICD-10-CM | POA: Diagnosis not present

## 2022-08-12 DIAGNOSIS — D72829 Elevated white blood cell count, unspecified: Secondary | ICD-10-CM

## 2022-08-12 DIAGNOSIS — E785 Hyperlipidemia, unspecified: Secondary | ICD-10-CM | POA: Diagnosis not present

## 2022-08-12 DIAGNOSIS — E1165 Type 2 diabetes mellitus with hyperglycemia: Secondary | ICD-10-CM

## 2022-08-12 MED ORDER — HYDROCHLOROTHIAZIDE 25 MG PO TABS: 25.0000 mg | ORAL_TABLET | Freq: Every day | ORAL | 1 refills | Status: DC

## 2022-08-12 MED ORDER — GLIMEPIRIDE 4 MG PO TABS: 4.0000 mg | ORAL_TABLET | Freq: Two times a day (BID) | ORAL | 1 refills | Status: DC

## 2022-08-12 MED ORDER — ATORVASTATIN CALCIUM 40 MG PO TABS: 40.0000 mg | ORAL_TABLET | Freq: Every day | ORAL | 3 refills | Status: DC

## 2022-08-12 MED ORDER — AMLODIPINE BESYLATE 10 MG PO TABS: 10.0000 mg | ORAL_TABLET | Freq: Every day | ORAL | 1 refills | Status: DC

## 2022-08-12 NOTE — Progress Notes (Signed)
Virtual Visit via Telephone Note  I discussed the limitations, risks, security and privacy concerns of performing an evaluation and management service by telephone and the availability of in person appointments. I also discussed with the patient that there may be a patient responsible charge related to this service. The patient expressed understanding and agreed to proceed.    I connected with Maria Robinson on 08/12/22  at   8:30 AM EDT  EDT by telephone and verified that I am speaking with the correct person using two identifiers.  Location of Patient: Private Residence   Location of Provider: [place] and [employer] Office    Persons participating in Telemedicine visit: Chelsye Suhre FNP-BC [place]    History of Present Illness: Telemedicine visit for: Medication refills She has a past medical history of Allergy, Cataract, Diabetes mellitus without complication, Hypertension, Low blood potassium, and Sickle cell trait      Past Medical History:  Diagnosis Date   Allergy    Cataract    Diabetes mellitus without complication ([place])    Hypertension    Low blood potassium    Sickle cell trait ([name])     Past Surgical History:  Procedure Laterality Date   BIOPSY  06/13/2019   Procedure: BIOPSY;  Surgeon: Jackquline Denmark, MD;  Location: [facility];  Service: Endoscopy;;   CESAREAN SECTION     x 3   ESOPHAGOGASTRODUODENOSCOPY (EGD) WITH PROPOFOL N/A 06/13/2019   Procedure: ESOPHAGOGASTRODUODENOSCOPY (EGD) WITH PROPOFOL;  Surgeon: Jackquline Denmark, MD;  Location: [facility];  Service: Endoscopy;  Laterality: N/A;   HAND SURGERY Right    MALONEY DILATION  06/13/2019   Procedure: MALONEY DILATION;  Surgeon: Jackquline Denmark, MD;  Location: [facility];  Service: Endoscopy;;    Family History  Problem Relation Age of Onset   Diabetes Father    Colon cancer Neg Hx    Colon polyps Neg Hx    Esophageal cancer Neg Hx    Rectal cancer Neg Hx    Stomach cancer  Neg Hx     Social History   Socioeconomic History   Marital status: Single    Spouse name: Not on file   Number of children: Not on file   Years of education: Not on file   Highest education level: Not on file  Occupational History   Not on file  Tobacco Use   Smoking status: Never   Smokeless tobacco: Never  Vaping Use   Vaping Use: Never used  Substance and Sexual Activity   Alcohol use: No   Drug use: No   Sexual activity: Not Currently  Other Topics Concern   Not on file  Social History Narrative   Not on file   Social Determinants of Health   Financial Resource Strain: Not on file  Food Insecurity: Not on file  Transportation Needs: Not on file  Physical Activity: Not on file  Stress: Not on file  Social Connections: Not on file     Observations/Objective: Awake, alert and oriented x 3   ROS  Assessment and Plan: Diagnoses and all orders for this visit:  Essential hypertension -     CMP14+EGFR -     CBC with Differential  Type 2 diabetes mellitus with hyperglycemia, without long-term current use of insulin (HCC) -     CMP14+EGFR -     Hemoglobin A1c  Leukocytosis, unspecified type -     CBC with Differential  Dyslipidemia, goal LDL below 70 -     Lipid  panel     Follow Up Instructions No follow-ups on file.     I discussed the assessment and treatment plan with the patient. The patient was provided an opportunity to ask questions and all were answered. The patient agreed with the plan and demonstrated an understanding of the instructions.   The patient was advised to call back or seek an in-person evaluation if the symptoms worsen or if the condition fails to improve as anticipated.  I provided 12 minutes of non-face-to-face time during this encounter including median intraservice time, reviewing previous notes, labs, imaging, medications and explaining diagnosis and management.  Maria Pounds, FNP-BC

## 2022-08-12 NOTE — Progress Notes (Signed)
Virtual Visit via Telephone Note  I discussed the limitations, risks, security and privacy concerns of performing an evaluation and management service by telephone and the availability of in person appointments. I also discussed with the patient that there may be a patient responsible charge related to this service. The patient expressed understanding and agreed to proceed.    I connected with Rosalva Krotz on 08/12/22  at   8:30 AM EDT  EDT by telephone and verified that I am speaking with the correct person using two identifiers.  Location of Patient: Private Residence   Location of Provider: [place] [facility] [employer] Office    Persons participating in Telemedicine visit: Nadalyn Deringer FNP-BC [place]    History of Present Illness: Telemedicine visit for: DM/HTN Medication refills She has a past medical history of Allergy, Cataract, Diabetes mellitus without complication, Hypertension, Low blood potassium, and Sickle cell trait    HTN Blood pressures well controlled with amlodipine 10 mg daily, hydrochlorothiazide 25 mg daily and lisinopril 40 mg daily. She does not frequently monitor her blood pressure at home. BP Readings from Last 3 Encounters:  05/18/22 131/80  03/12/22 117/61  01/29/22 132/82      DM 2 Diabetes is not well controlled with goal A1c less than 7.  She cannot tolerate an increase in her metformin due to GI upset and is currently taking 500 mg twice daily and glimepiride 4 mg BID Lab Results  Component Value Date   HGBA1C 7.8 (A) 01/29/2022   LDL not at goal with atorvastatin 20 mg daily. Will increase to 40 mg daily.  Lab Results  Component Value Date   LDLCALC 141 (H) 01/29/2022     Past Medical History:  Diagnosis Date   Allergy    Cataract    Diabetes mellitus without complication ([place])    Hypertension    Low blood potassium    Sickle cell trait [facility])     Past Surgical History:  Procedure Laterality Date   BIOPSY   06/13/2019   Procedure: BIOPSY;  Surgeon: Jackquline Denmark, MD;  [facility];  Service: Endoscopy;;   CESAREAN SECTION     x 3   ESOPHAGOGASTRODUODENOSCOPY (EGD) WITH PROPOFOL N/A 06/13/2019   Procedure: ESOPHAGOGASTRODUODENOSCOPY (EGD) WITH PROPOFOL;  Surgeon: Jackquline Denmark, MD;  [facility];  Service: Endoscopy;  Laterality: N/A;   HAND SURGERY Right    MALONEY DILATION  06/13/2019   Procedure: MALONEY DILATION;  Surgeon: Jackquline Denmark, MD;  [facility];  Service: Endoscopy;;    Family History  Problem Relation Age of Onset   Diabetes Father    Colon cancer Neg Hx    Colon polyps Neg Hx    Esophageal cancer Neg Hx    Rectal cancer Neg Hx    Stomach cancer Neg Hx     Social History   Socioeconomic History   Marital status: Single    Spouse name: Not on file   Number of children: Not on file   Years of education: Not on file   Highest education level: Not on file  Occupational History   Not on file  Tobacco Use   Smoking status: Never   Smokeless tobacco: Never  Vaping Use   Vaping Use: Never used  Substance and Sexual Activity   Alcohol use: No   Drug use: No   Sexual activity: Not Currently  Other Topics Concern   Not on file  Social History Narrative   Not on file   Social  Determinants of Health   Financial Resource Strain: Not on file  Food Insecurity: Not on file  Transportation Needs: Not on file  Physical Activity: Not on file  Stress: Not on file  Social Connections: Not on file     Observations/Objective: Awake, alert and oriented x 3   Review of Systems  Constitutional:  Negative for fever, malaise/fatigue and weight loss.  HENT: Negative.  Negative for nosebleeds.   Eyes: Negative.  Negative for blurred vision, double vision and photophobia.  Respiratory: Negative.  Negative for cough and shortness of breath.   Cardiovascular: Negative.  Negative for chest pain, palpitations and leg swelling.  Gastrointestinal:  Negative.  Negative for heartburn, nausea and vomiting.  Musculoskeletal: Negative.  Negative for myalgias.  Neurological: Negative.  Negative for dizziness, focal weakness, seizures and headaches.  Psychiatric/Behavioral: Negative.  Negative for suicidal ideas.     Assessment and Plan: Diagnoses and all orders for this visit:  Essential hypertension -     CMP14+EGFR -     CBC with Differential -     hydrochlorothiazide (HYDRODIURIL) 25 MG tablet; Take 1 tablet (25 mg total) by mouth daily. -     amLODipine (NORVASC) 10 MG tablet; Take 1 tablet (10 mg total) by mouth daily.  Type 2 diabetes mellitus with hyperglycemia, without long-term current use of insulin (HCC) -     CMP14+EGFR -     Hemoglobin A1c -     glimepiride (AMARYL) 4 MG tablet; Take 1 tablet (4 mg total) by mouth 2 (two) times daily.  Leukocytosis, unspecified type -     CBC with Differential  Dyslipidemia, goal LDL below 70 -     Lipid panel -     atorvastatin (LIPITOR) 40 MG tablet; Take 1 tablet (40 mg total) by mouth daily.  Vitamin D deficiency disease -     VITAMIN D 25 Hydroxy (Vit-D Deficiency, Fractures)     Follow Up Instructions Return in about 5 weeks (around 09/16/2022) for HTN.     I discussed the assessment and treatment plan with the patient. The patient was provided an opportunity to ask questions and all were answered. The patient agreed with the plan and demonstrated an understanding of the instructions.   The patient was advised to call back or seek an in-person evaluation if the symptoms worsen or if the condition fails to improve as anticipated.  I provided 11 minutes of non-face-to-face time during this encounter including median intraservice time, reviewing previous notes, labs, imaging, medications and explaining diagnosis and management.  Gildardo Pounds, FNP-BC

## 2022-08-22 ENCOUNTER — Ambulatory Visit: Payer: Medicare HMO | Attending: Nurse Practitioner

## 2022-08-22 ENCOUNTER — Other Ambulatory Visit: Payer: Self-pay | Admitting: Nurse Practitioner

## 2022-08-22 DIAGNOSIS — E559 Vitamin D deficiency, unspecified: Secondary | ICD-10-CM | POA: Diagnosis not present

## 2022-08-22 DIAGNOSIS — E785 Hyperlipidemia, unspecified: Secondary | ICD-10-CM | POA: Diagnosis not present

## 2022-08-22 DIAGNOSIS — E1165 Type 2 diabetes mellitus with hyperglycemia: Secondary | ICD-10-CM | POA: Diagnosis not present

## 2022-08-22 DIAGNOSIS — D72829 Elevated white blood cell count, unspecified: Secondary | ICD-10-CM | POA: Diagnosis not present

## 2022-08-22 DIAGNOSIS — I10 Essential (primary) hypertension: Secondary | ICD-10-CM | POA: Diagnosis not present

## 2022-08-23 LAB — CMP14+EGFR
ALT: 17 IU/L (ref 0–32)
AST: 19 IU/L (ref 0–40)
Albumin/Globulin Ratio: 1.8 (ref 1.2–2.2)
Albumin: 4.9 g/dL (ref 3.9–4.9)
Alkaline Phosphatase: 111 IU/L (ref 44–121)
BUN/Creatinine Ratio: 24 (ref 12–28)
BUN: 27 mg/dL (ref 8–27)
Bilirubin Total: 0.4 mg/dL (ref 0.0–1.2)
CO2: 21 mmol/L (ref 20–29)
Calcium: 9.6 mg/dL (ref 8.7–10.3)
Chloride: 100 mmol/L (ref 96–106)
Creatinine, Ser: 1.14 mg/dL — ABNORMAL HIGH (ref 0.57–1.00)
Globulin, Total: 2.8 g/dL (ref 1.5–4.5)
Glucose: 113 mg/dL — ABNORMAL HIGH (ref 70–99)
Potassium: 4.8 mmol/L (ref 3.5–5.2)
Sodium: 139 mmol/L (ref 134–144)
Total Protein: 7.7 g/dL (ref 6.0–8.5)
eGFR: 53 mL/min/{1.73_m2} — ABNORMAL LOW (ref >59)

## 2022-08-23 LAB — CBC WITH DIFFERENTIAL/PLATELET
Basophils Absolute: 0.2 10*3/uL (ref 0.0–0.2)
Basos: 1 %
EOS (ABSOLUTE): 0.2 10*3/uL (ref 0.0–0.4)
Eos: 1 %
Hematocrit: 35 % (ref 34.0–46.6)
Hemoglobin: 10.7 g/dL — ABNORMAL LOW (ref 11.1–15.9)
Immature Grans (Abs): 0 10*3/uL (ref 0.0–0.1)
Immature Granulocytes: 0 %
Lymphocytes Absolute: 5.5 10*3/uL — ABNORMAL HIGH (ref 0.7–3.1)
Lymphs: 38 %
MCH: 22.9 pg — ABNORMAL LOW (ref 26.6–33.0)
MCHC: 30.6 g/dL — ABNORMAL LOW (ref 31.5–35.7)
MCV: 75 fL — ABNORMAL LOW (ref 79–97)
Monocytes Absolute: 1 10*3/uL — ABNORMAL HIGH (ref 0.1–0.9)
Monocytes: 7 %
Neutrophils Absolute: 7.6 10*3/uL — ABNORMAL HIGH (ref 1.4–7.0)
Neutrophils: 53 %
Platelets: 361 10*3/uL (ref 150–450)
RBC: 4.68 x10E6/uL (ref 3.77–5.28)
RDW: 16.1 % — ABNORMAL HIGH (ref 11.7–15.4)
WBC: 14.4 10*3/uL — ABNORMAL HIGH (ref 3.4–10.8)

## 2022-08-23 LAB — LIPID PANEL
Chol/HDL Ratio: 3.6 ratio (ref 0.0–4.4)
Cholesterol, Total: 189 mg/dL (ref 100–199)
HDL: 52 mg/dL (ref >39)
LDL Chol Calc (NIH): 108 mg/dL — ABNORMAL HIGH (ref 0–99)
Triglycerides: 166 mg/dL — ABNORMAL HIGH (ref 0–149)
VLDL Cholesterol Cal: 29 mg/dL (ref 5–40)

## 2022-08-23 LAB — HEMOGLOBIN A1C
Est. average glucose Bld gHb Est-mCnc: 171 mg/dL
Hgb A1c MFr Bld: 7.6 % — ABNORMAL HIGH (ref 4.8–5.6)

## 2022-08-23 LAB — VITAMIN D 25 HYDROXY (VIT D DEFICIENCY, FRACTURES): Vit D, 25-Hydroxy: 26.6 ng/mL — ABNORMAL LOW (ref 30.0–100.0)

## 2022-08-27 NOTE — Progress Notes (Signed)
Unable to reach patient by phone 

## 2022-09-01 ENCOUNTER — Telehealth: Payer: Self-pay

## 2022-09-01 NOTE — Telephone Encounter (Signed)
Pt. Given lab results and instructions, verbalizes understanding. 

## 2022-09-18 ENCOUNTER — Ambulatory Visit: Payer: Medicare HMO | Attending: Nurse Practitioner | Admitting: Nurse Practitioner

## 2022-09-18 ENCOUNTER — Encounter: Payer: Self-pay | Admitting: Nurse Practitioner

## 2022-09-18 VITALS — BP 121/74 | HR 88 | Temp 98.1°F | Ht 59.0 in | Wt 167.6 lb

## 2022-09-18 DIAGNOSIS — I10 Essential (primary) hypertension: Secondary | ICD-10-CM | POA: Diagnosis not present

## 2022-09-18 DIAGNOSIS — Z23 Encounter for immunization: Secondary | ICD-10-CM

## 2022-09-18 NOTE — Progress Notes (Signed)
Assessment & Plan:  Maria Robinson was seen today for hypertension.  Diagnoses and all orders for this visit:  Primary hypertension  Need for influenza vaccination -     Flu Vaccine QUAD High Dose(Fluad)    Patient has been counseled on age-appropriate routine health concerns for screening and prevention. These are reviewed and up-to-date. Referrals have been placed accordingly. Immunizations are up-to-date or declined.    Subjective:   Chief Complaint  Patient presents with   Hypertension   HPI Maria Robinson 66 y.o. female presents to office today for follow up to HTN.  She has a past medical history of Allergy, Cataract, Diabetes mellitus without complication, Hypertension, Low blood potassium, and Sickle cell trait     She is taking HCTZ 25 mg daily, lisinopril 40 mg daily and amlodipine 10 mg daily as prescribed. Blood pressure is well controlled. She does not have a BP log with her today.  BP Readings from Last 3 Encounters:  09/18/22 121/74  05/18/22 131/80  03/12/22 117/61     Review of Systems  Constitutional:  Negative for fever, malaise/fatigue and weight loss.  HENT: Negative.  Negative for nosebleeds.   Eyes: Negative.  Negative for blurred vision, double vision and photophobia.  Respiratory: Negative.  Negative for cough and shortness of breath.   Cardiovascular: Negative.  Negative for chest pain, palpitations and leg swelling.  Gastrointestinal: Negative.  Negative for heartburn, nausea and vomiting.  Musculoskeletal: Negative.  Negative for myalgias.  Neurological: Negative.  Negative for dizziness, focal weakness, seizures and headaches.  Psychiatric/Behavioral: Negative.  Negative for suicidal ideas.     Past Medical History:  Diagnosis Date   Allergy    Cataract    Diabetes mellitus without complication ([place])    Hypertension    Low blood potassium    Sickle cell trait [facility])     Past Surgical History:  Procedure Laterality Date   BIOPSY   06/13/2019   Procedure: BIOPSY;  Surgeon: Jackquline Denmark, MD;  [facility];  Service: Endoscopy;;   CESAREAN SECTION     x 3   ESOPHAGOGASTRODUODENOSCOPY (EGD) WITH PROPOFOL N/A 06/13/2019   Procedure: ESOPHAGOGASTRODUODENOSCOPY (EGD) WITH PROPOFOL;  Surgeon: Jackquline Denmark, MD;  [facility];  Service: Endoscopy;  Laterality: N/A;   HAND SURGERY Right    MALONEY DILATION  06/13/2019   Procedure: MALONEY DILATION;  Surgeon: Jackquline Denmark, MD;  [facility];  Service: Endoscopy;;    Family History  Problem Relation Age of Onset   Diabetes Father    Colon cancer Neg Hx    Colon polyps Neg Hx    Esophageal cancer Neg Hx    Rectal cancer Neg Hx    Stomach cancer Neg Hx     Social History Reviewed with no changes to be made today.   Outpatient Medications Prior to Visit  Medication Sig Dispense Refill   amLODipine (NORVASC) 10 MG tablet Take 1 tablet (10 mg total) by mouth daily. 90 tablet 1   atorvastatin (LIPITOR) 40 MG tablet Take 1 tablet (40 mg total) by mouth daily. 90 tablet 3   Blood Glucose Monitoring Suppl (ONETOUCH VERIO REFLECT) w/Device KIT Use to check blood sugar daily. 1 kit 0   glimepiride (AMARYL) 4 MG tablet Take 1 tablet (4 mg total) by mouth 2 (two) times daily. 180 tablet 1   glucose blood test strip Use as instructed to check blood sugar daily. 100 each 2   hydrochlorothiazide (HYDRODIURIL) 25 MG tablet Take 1 tablet (  25 mg total) by mouth daily. 90 tablet 1   Lancets (ONETOUCH DELICA PLUS [id number]) MISC Use as instructed. Check blood glucose level by fingerstick twice per day. 100 each 1   lisinopril (ZESTRIL) 40 MG tablet TAKE 1 TABLET BY MOUTH EVERY DAY 90 tablet 0   metFORMIN (GLUCOPHAGE) 500 MG tablet Take 1 tablet (500 mg total) by mouth 2 (two) times daily with a meal. 180 tablet 3   pantoprazole (PROTONIX) 40 MG tablet TAKE 1 TABLET BY MOUTH EVERY DAY 90 tablet 1   Vitamin D, Ergocalciferol, (DRISDOL) 1.25 MG (50000 UNIT) CAPS  capsule Take 1 capsule (50,000 Units total) by mouth every 7 (seven) days. 12 capsule 1   ibuprofen (ADVIL) 800 MG tablet Take 1 tablet (800 mg total) by mouth every 8 (eight) hours as needed. (Patient not taking: Reported on 09/18/2022) 30 tablet 0   No facility-administered medications prior to visit.    Allergies  Allergen Reactions   Penicillins Swelling and Rash    Did it involve swelling of the face/tongue/throat, SOB, or low BP? y Did it involve sudden or severe rash/hives, skin peeling, or any reaction on the inside of your mouth or nose? y Did you need to seek medical attention at a hospital or doctor's office? y When did it last happen? 2026       If all above answers are "NO", may proceed with cephalosporin use.       Objective:    BP 121/74   Pulse 88   Temp 98.1 F (36.7 C) (Oral)   Ht '4\' 11"'  (1.499 m)   Wt 167 lb 9.6 oz (76 kg)   SpO2 97%   BMI 33.85 kg/m  Wt Readings from Last 3 Encounters:  09/18/22 167 lb 9.6 oz (76 kg)  03/12/22 171 lb 4.8 oz (77.7 kg)  01/29/22 174 lb 8 oz (79.2 kg)    Physical Exam Vitals and nursing note reviewed.  Constitutional:      Appearance: She is well-developed.  HENT:     Head: Normocephalic and atraumatic.  Eyes:     Comments: Wears glasses  Cardiovascular:     Rate and Rhythm: Normal rate and regular rhythm.     Heart sounds: Normal heart sounds. No murmur heard.    No friction rub. No gallop.  Pulmonary:     Effort: Pulmonary effort is normal. No tachypnea or respiratory distress.     Breath sounds: Normal breath sounds. No decreased breath sounds, wheezing, rhonchi or rales.  Chest:     Chest wall: No tenderness.  Abdominal:     General: Bowel sounds are normal.     Palpations: Abdomen is soft.  Musculoskeletal:        General: Normal range of motion.     Cervical back: Normal range of motion.  Skin:    General: Skin is warm and dry.  Neurological:     Mental Status: She is alert and oriented to person,  place, and time.     Coordination: Coordination normal.  Psychiatric:        Behavior: Behavior normal. Behavior is cooperative.        Thought Content: Thought content normal.        Judgment: Judgment normal.          Patient has been counseled extensively about nutrition and exercise as well as the importance of adherence with medications and regular follow-up. The patient was given clear instructions to go to ER or return to  medical center if symptoms don't improve, worsen or new problems develop. The patient verbalized understanding.   Follow-up: No follow-ups on file.   Gildardo Pounds, FNP-BC [facility] [place], [place]   09/18/2022, 10:15 AM

## 2022-11-02 ENCOUNTER — Other Ambulatory Visit: Payer: Self-pay | Admitting: Family Medicine

## 2022-11-02 DIAGNOSIS — I10 Essential (primary) hypertension: Secondary | ICD-10-CM

## 2022-11-03 NOTE — Telephone Encounter (Signed)
Requested Prescriptions  Pending Prescriptions Disp Refills   lisinopril (ZESTRIL) 40 MG tablet [Pharmacy Med Name: LISINOPRIL 40 MG TABLET] 90 tablet 0    Sig: TAKE 1 TABLET BY MOUTH EVERY DAY     Cardiovascular:  ACE Inhibitors Failed - 11/02/2022  1:32 AM      Failed - Cr in normal range and within 180 days    Creat  Date Value Ref Range Status  08/16/2020 0.90 0.50 - 0.99 mg/dL Final    Comment:    For patients >65 years of age, the reference limit for Creatinine is approximately 13% higher for people identified as African-American. .    Creatinine, Ser  Date Value Ref Range Status  08/22/2022 1.14 (H) 0.57 - 1.00 mg/dL Final         Passed - K in normal range and within 180 days    Potassium  Date Value Ref Range Status  08/22/2022 4.8 3.5 - 5.2 mmol/L Final         Passed - Patient is not pregnant      Passed - Last BP in normal range    BP Readings from Last 1 Encounters:  09/18/22 121/74         Passed - Valid encounter within last 6 months    Recent Outpatient Visits           1 month ago Primary hypertension   [place], Vernia Buff, NP   2 months ago Essential hypertension   [place], [place] W, NP   9 months ago Type 2 diabetes mellitus with hyperglycemia, without long-term current use of insulin [facility])   [place], Vernia Buff, NP   1 year ago Essential hypertension   [place], Jarome Matin, RPH-CPP   1 year ago Essential hypertension   [place], Jarome Matin, RPH-CPP       Future Appointments             In 1 month Gildardo Pounds, NP [place]

## 2022-12-17 ENCOUNTER — Ambulatory Visit: Payer: Medicare HMO | Admitting: Nurse Practitioner

## 2023-01-19 ENCOUNTER — Other Ambulatory Visit: Payer: Self-pay | Admitting: Nurse Practitioner

## 2023-01-19 DIAGNOSIS — E1165 Type 2 diabetes mellitus with hyperglycemia: Secondary | ICD-10-CM

## 2023-01-19 DIAGNOSIS — E785 Hyperlipidemia, unspecified: Secondary | ICD-10-CM

## 2023-01-19 NOTE — Telephone Encounter (Signed)
Atorvastatin- no longer on this dose Requested Prescriptions  Pending Prescriptions Disp Refills   metFORMIN (GLUCOPHAGE) 500 MG tablet [Pharmacy Med Name: METFORMIN HCL 500 MG TABLET] 180 tablet 0    Sig: TAKE 1 TABLET BY MOUTH 2 TIMES DAILY WITH A MEAL.     Endocrinology:  Diabetes - Biguanides Failed - 01/19/2023  1:28 AM      Failed - Cr in normal range and within 360 days    Creat  Date Value Ref Range Status  08/16/2020 0.90 0.50 - 0.99 mg/dL Final    Comment:    For patients >40 years of age, the reference limit for Creatinine is approximately 13% higher for people identified as African-American. .    Creatinine, Ser  Date Value Ref Range Status  08/22/2022 1.14 (H) 0.57 - 1.00 mg/dL Final         Failed - eGFR in normal range and within 360 days    GFR, Est African American  Date Value Ref Range Status  08/16/2020 79 > OR = 60 mL/min/1.40m Final   GFR calc Af Amer  Date Value Ref Range Status  11/12/2020 100 >59 mL/min/1.73 Final    Comment:    **In accordance with recommendations from the NKF-ASN Task force,**   [facility] is in the process of updating its eGFR calculation to the   2021 CKD-EPI creatinine equation that estimates kidney function   without a race variable.    GFR, Est Non African American  Date Value Ref Range Status  08/16/2020 68 > OR = 60 mL/min/1.737mFinal   GFR calc non Af Amer  Date Value Ref Range Status  11/12/2020 86 >59 mL/min/1.73 Final   eGFR  Date Value Ref Range Status  08/22/2022 53 (L) >59 mL/min/1.73 Final         Failed - B12 Level in normal range and within 720 days    Vitamin B-12  Date Value Ref Range Status  06/10/2019 1,597 (H) 180 - 914 pg/mL Final    Comment:    RESULTS CONFIRMED BY MANUAL DILUTION (NOTE) This assay is not validated for testing neonatal or myeloproliferative syndrome specimens for Vitamin B12 levels. Performed at [facility] [place] [address]         Passed - HBA1C is between 0 and 7.9 and within 180 days    Hgb A1c MFr Bld  Date Value Ref Range Status  08/22/2022 7.6 (H) 4.8 - 5.6 % Final    Comment:             Prediabetes: 5.7 - 6.4          Diabetes: >6.4          Glycemic control for adults with diabetes: <7.0          Passed - Valid encounter within last 6 months    Recent Outpatient Visits           4 months ago Primary hypertension   Co[place]   5 months ago Essential hypertension   [place], [place]   11 months ago Type 2 diabetes mellitus with hyperglycemia, without long-term current use of insulin ([facility]  [place] Buff[place]   1 year ago Essential hypertension   [place]PH-CPP  1 year ago Essential hypertension   [place], RPH-CPP       Future Appointments             In 1 month Gildardo Pounds, NP [place]            Passed - CBC within normal limits and completed in the last 12 months    WBC  Date Value Ref Range Status  08/22/2022 14.4 (H) 3.4 - 10.8 x10E3/uL Final  06/14/2019 17.7 (H) 4.0 - 10.5 K/uL Final   WBC Count  Date Value Ref Range Status  01/04/2020 9.4 4.0 - 10.5 K/uL Final   RBC  Date Value Ref Range Status  08/22/2022 4.68 3.77 - 5.28 x10E6/uL Final  01/04/2020 5.32 (H) 3.87 - 5.11 MIL/uL Final   Hemoglobin  Date Value Ref Range Status  08/22/2022 10.7 (L) 11.1 - 15.9 g/dL Final   Hematocrit  Date Value Ref Range Status  08/22/2022 35.0 34.0 - 46.6 % Final   MCHC  Date Value Ref Range Status  08/22/2022 30.6 (L) 31.5 - 35.7 g/dL Final  01/04/2020 32.1 30.0 - 36.0 g/dL Final   [facility]  Date Value Ref Range Status  08/22/2022 22.9 (L) 26.6 - 33.0 pg  Final  01/04/2020 23.5 (L) 26.0 - 34.0 pg Final   MCV  Date Value Ref Range Status  08/22/2022 75 (L) 79 - 97 fL Final   No results found for: "PLTCOUNTKUC", "LABPLAT", "POCPLA" RDW  Date Value Ref Range Status  08/22/2022 16.1 (H) 11.7 - 15.4 % Final         Refused Prescriptions Disp Refills   atorvastatin (LIPITOR) 20 MG tablet [Pharmacy Med Name: ATORVASTATIN 20 MG TABLET] 90 tablet 3    Sig: TAKE 1 TABLET BY MOUTH EVERY DAY     Cardiovascular:  Antilipid - Statins Failed - 01/19/2023  1:28 AM      Failed - Lipid Panel in normal range within the last 12 months    Cholesterol, Total  Date Value Ref Range Status  08/22/2022 189 100 - 199 mg/dL Final   LDL Chol Calc (NIH)  Date Value Ref Range Status  08/22/2022 108 (H) 0 - 99 mg/dL Final   HDL  Date Value Ref Range Status  08/22/2022 52 >39 mg/dL Final   Triglycerides  Date Value Ref Range Status  08/22/2022 166 (H) 0 - 149 mg/dL Final         Passed - Patient is not pregnant      Passed - Valid encounter within last 12 months    Recent Outpatient Visits           4 months ago Primary hypertension   [place], Vernia Buff, NP   5 months ago Essential hypertension   [place], [place] W, NP   11 months ago Type 2 diabetes mellitus with hyperglycemia, without long-term current use of insulin [facility])   [place], Vernia Buff, NP   1 year ago Essential hypertension   [place], RPH-CPP   1 year ago Essential hypertension   [place], Stephen L, RPH-CPP       Future Appointments  In 1 month Gildardo Pounds, NP [place]

## 2023-01-27 ENCOUNTER — Other Ambulatory Visit: Payer: Self-pay | Admitting: Family Medicine

## 2023-01-27 DIAGNOSIS — E559 Vitamin D deficiency, unspecified: Secondary | ICD-10-CM

## 2023-01-27 NOTE — Telephone Encounter (Signed)
Requested medication (s) are due for refill today: yes  Requested medication (s) are on the active medication list: yes  Last refill:  08/07/22 #12/1  Future visit scheduled: yes  Notes to clinic:  Unable to refill per protocol, cannot delegate.    Requested Prescriptions  Pending Prescriptions Disp Refills   Vitamin D, Ergocalciferol, (DRISDOL) 1.25 MG (50000 UNIT) CAPS capsule [Pharmacy Med Name: VITAMIN D2 1.'25MG'$ (50,000 UNIT)] 12 capsule 1    Sig: Take 1 capsule (50,000 Units total) by mouth every 7 (seven) days.     Endocrinology:  Vitamins - Vitamin D Supplementation 2 Failed - 01/27/2023  1:25 AM      Failed - Manual Review: Route requests for 50,000 IU strength to the provider      Failed - Vitamin D in normal range and within 360 days    Vit D, 25-Hydroxy  Date Value Ref Range Status  08/22/2022 26.6 (L) 30.0 - 100.0 ng/mL Final    Comment:    Vitamin D deficiency has been defined by the [place] practice guideline as a level of serum 25-OH vitamin D less than 20 ng/mL (1,2). The Endocrine Society went on to further define vitamin D insufficiency as a level between 21 and 29 ng/mL (2). 1. IOM (Institute of Medicine). 2010. Dietary reference    intakes for calcium and D. [place]: The    [employer]. 2. Holick MF, Binkley [place], Bischoff-Ferrari HA, et al.    Evaluation, treatment, and prevention of vitamin D    deficiency: an Endocrine Society clinical practice    guideline. JCEM. 2011 Jul; 96(7):1911-30.          Passed - Ca in normal range and within 360 days    Calcium  Date Value Ref Range Status  08/22/2022 9.6 8.7 - 10.3 mg/dL Final         Passed - Valid encounter within last 12 months    Recent Outpatient Visits           4 months ago Primary hypertension   [place], NP   5 months ago Essential hypertension   [place], [place] W, NP   12 months ago Type 2 diabetes mellitus with hyperglycemia, without long-term current use of insulin [facility])   [place], Vernia Buff, NP   1 year ago Essential hypertension   [place], RPH-CPP   1 year ago Essential hypertension   [place], [place], RPH-CPP       Future Appointments             In 1 month Gildardo Pounds, NP [place]

## 2023-02-09 ENCOUNTER — Other Ambulatory Visit (HOSPITAL_COMMUNITY): Payer: Self-pay

## 2023-02-18 DIAGNOSIS — H0288B Meibomian gland dysfunction left eye, upper and lower eyelids: Secondary | ICD-10-CM | POA: Diagnosis not present

## 2023-02-18 DIAGNOSIS — H04123 Dry eye syndrome of bilateral lacrimal glands: Secondary | ICD-10-CM | POA: Diagnosis not present

## 2023-02-18 DIAGNOSIS — H25813 Combined forms of age-related cataract, bilateral: Secondary | ICD-10-CM | POA: Diagnosis not present

## 2023-02-18 DIAGNOSIS — H0288A Meibomian gland dysfunction right eye, upper and lower eyelids: Secondary | ICD-10-CM | POA: Diagnosis not present

## 2023-02-18 DIAGNOSIS — E119 Type 2 diabetes mellitus without complications: Secondary | ICD-10-CM | POA: Diagnosis not present

## 2023-02-18 LAB — HM DIABETES EYE EXAM

## 2023-03-04 ENCOUNTER — Telehealth: Payer: Self-pay | Admitting: Nurse Practitioner

## 2023-03-04 NOTE — Telephone Encounter (Signed)
Called patient to schedule Medicare Annual Wellness Visit (AWV). Left message for patient to call back and schedule Medicare Annual Wellness Visit (AWV).  Last date of AWV: due  awvi 12/22/22 per [facility]        If any questions, please contact me at [phone].  Thank you ,  Barkley Boards AWV direct phone # [phone]

## 2023-03-06 ENCOUNTER — Telehealth: Payer: Self-pay | Admitting: Nurse Practitioner

## 2023-03-06 NOTE — Telephone Encounter (Signed)
Sidney to schedule their annual wellness visit. Appointment made for 03/13/23.  Barkley Boards AWV direct phone # [phone]

## 2023-03-09 ENCOUNTER — Encounter: Payer: Self-pay | Admitting: Nurse Practitioner

## 2023-03-09 ENCOUNTER — Ambulatory Visit: Payer: Medicare HMO | Attending: Nurse Practitioner | Admitting: Nurse Practitioner

## 2023-03-09 VITALS — BP 135/80 | HR 98 | Ht 59.0 in | Wt 166.6 lb

## 2023-03-09 DIAGNOSIS — D509 Iron deficiency anemia, unspecified: Secondary | ICD-10-CM

## 2023-03-09 DIAGNOSIS — E1165 Type 2 diabetes mellitus with hyperglycemia: Secondary | ICD-10-CM | POA: Diagnosis not present

## 2023-03-09 DIAGNOSIS — D573 Sickle-cell trait: Secondary | ICD-10-CM | POA: Diagnosis not present

## 2023-03-09 DIAGNOSIS — Z1231 Encounter for screening mammogram for malignant neoplasm of breast: Secondary | ICD-10-CM

## 2023-03-09 DIAGNOSIS — Z780 Asymptomatic menopausal state: Secondary | ICD-10-CM

## 2023-03-09 DIAGNOSIS — Z13820 Encounter for screening for osteoporosis: Secondary | ICD-10-CM | POA: Diagnosis not present

## 2023-03-09 DIAGNOSIS — E7800 Pure hypercholesterolemia, unspecified: Secondary | ICD-10-CM

## 2023-03-09 LAB — POCT GLYCOSYLATED HEMOGLOBIN (HGB A1C): Hemoglobin A1C: 7.5 % — AB (ref 4.0–5.6)

## 2023-03-09 MED ORDER — ACCU-CHEK GUIDE VI STRP: ORAL_STRIP | 12 refills | Status: AC

## 2023-03-09 MED ORDER — ACCU-CHEK SOFTCLIX LANCETS MISC: 12 refills | Status: AC

## 2023-03-09 MED ORDER — LANCET DEVICE MISC: 1.0000 | Freq: Two times a day (BID) | 0 refills | Status: AC

## 2023-03-09 MED ORDER — BLOOD GLUCOSE TEST VI STRP: 1.0000 | ORAL_STRIP | Freq: Two times a day (BID) | 0 refills | Status: DC

## 2023-03-09 MED ORDER — BLOOD GLUCOSE MONITORING SUPPL DEVI: 1.0000 | Freq: Two times a day (BID) | 0 refills | Status: DC

## 2023-03-09 MED ORDER — ACCU-CHEK GUIDE ME W/DEVICE KIT: PACK | 0 refills | Status: AC

## 2023-03-09 MED ORDER — LANCETS MISC. MISC: 1.0000 | Freq: Two times a day (BID) | 0 refills | Status: AC

## 2023-03-09 MED ORDER — ACCU-CHEK SOFTCLIX LANCETS MISC: 6 refills | Status: DC

## 2023-03-09 NOTE — Patient Instructions (Signed)
[facility] Address: [address], [place], [place] [address] Phone: [phone] 

## 2023-03-09 NOTE — Progress Notes (Signed)
Assessment & Plan:  Maria Robinson was seen today for hypertension and diabetes.  Diagnoses and all orders for this visit:  Type 2 diabetes mellitus with hyperglycemia, without long-term current use of insulin (HCC) -     Cancel: POCT glucose (manual entry) -     CMP14+EGFR -     Microalbumin / creatinine urine ratio -     POCT glycosylated hemoglobin (Hb A1C) Continue blood sugar control as discussed in office today, low carbohydrate diet, and regular physical exercise as tolerated, 150 minutes per week (30 min each day, 5 days per week, or 50 min 3 days per week). Keep blood sugar logs with fasting goal of 90-130 mg/dl, post prandial (after you eat) less than 180.  For Hypoglycemia: BS <60 and Hyperglycemia BS >400; contact the clinic ASAP. Annual eye exams and foot exams are recommended.   Breast cancer screening by mammogram -     MM 3D SCREENING MAMMOGRAM BILATERAL BREAST; Future  Microcytic anemia -     CBC with Differential  Hypercholesterolemia -     Lipid panel INSTRUCTIONS: Work on a low fat, heart healthy diet and participate in regular aerobic exercise program by working out at least 150 minutes per week; 5 days a week-30 minutes per day. Avoid red meat/beef/steak,  fried foods. junk foods, sodas, sugary drinks, unhealthy snacking, alcohol and smoking.  Drink at least 80 oz of water per day and monitor your carbohydrate intake daily.    Encounter for osteoporosis screening in asymptomatic postmenopausal patient -     DG Bone Density; Future    Patient has been counseled on age-appropriate routine health concerns for screening and prevention. These are reviewed and up-to-date. Referrals have been placed accordingly. Immunizations are up-to-date or declined.    Subjective:   Chief Complaint  Patient presents with   Hypertension   Diabetes   HPI Maria Robinson 67 y.o. female presents to office today for follow up to DM and HTN.  She has a past medical history of Allergy,  Cataract, Diabetes mellitus without complication, Hypertension, Low blood potassium, and Sickle cell trait      HTN She is taking HCTZ 25 mg daily, lisinopril 40 mg daily and amlodipine 10 mg daily as prescribed. Blood pressure is well controlled. She does not have a BP log with her today. Doing well today. Weight is trending down nicely.  BP Readings from Last 3 Encounters:  03/09/23 135/80  09/18/22 121/74  05/18/22 131/80     DM 2 Nearing goal of <7. She is currently taking glimepiride 4 mg BID and metformin 500 mg BID. She is requesting a new meter, strips and lancets. Current meter is broken and over 93 years old.  Lab Results  Component Value Date   HGBA1C 7.5 (A) 03/09/2023    Lab Results  Component Value Date   HGBA1C 7.6 (H) 08/22/2022  LDL not at goal with taking high intensity statin.  Lab Results  Component Value Date   LDLCALC 108 (H) 08/22/2022    Review of Systems  Constitutional:  Negative for fever, malaise/fatigue and weight loss.  HENT: Negative.  Negative for nosebleeds.   Eyes: Negative.  Negative for blurred vision, double vision and photophobia.  Respiratory: Negative.  Negative for cough and shortness of breath.   Cardiovascular: Negative.  Negative for chest pain, palpitations and leg swelling.  Gastrointestinal: Negative.  Negative for heartburn, nausea and vomiting.  Musculoskeletal: Negative.  Negative for myalgias.  Neurological: Negative.  Negative for dizziness,  focal weakness, seizures and headaches.  Psychiatric/Behavioral: Negative.  Negative for suicidal ideas.     Past Medical History:  Diagnosis Date   Allergy    Cataract    Diabetes mellitus without complication ([place])    Hypertension    Low blood potassium    Sickle cell trait [facility])     Past Surgical History:  Procedure Laterality Date   BIOPSY  06/13/2019   Procedure: BIOPSY;  Surgeon: Jackquline Denmark, MD;  Location: [facility];  Service: Endoscopy;;   CESAREAN SECTION     x 3    ESOPHAGOGASTRODUODENOSCOPY (EGD) WITH PROPOFOL N/A 06/13/2019   Procedure: ESOPHAGOGASTRODUODENOSCOPY (EGD) WITH PROPOFOL;  Surgeon: Jackquline Denmark, MD;  Location: [facility];  Service: Endoscopy;  Laterality: N/A;   HAND SURGERY Right    MALONEY DILATION  06/13/2019   Procedure: MALONEY DILATION;  Surgeon: Jackquline Denmark, MD;  Location: [facility];  Service: Endoscopy;;    Family History  Problem Relation Age of Onset   Diabetes Father    Colon cancer Neg Hx    Colon polyps Neg Hx    Esophageal cancer Neg Hx    Rectal cancer Neg Hx    Stomach cancer Neg Hx     Social History Reviewed with no changes to be made today.   Outpatient Medications Prior to Visit  Medication Sig Dispense Refill   amLODipine (NORVASC) 10 MG tablet Take 1 tablet (10 mg total) by mouth daily. 90 tablet 1   atorvastatin (LIPITOR) 40 MG tablet Take 1 tablet (40 mg total) by mouth daily. 90 tablet 3   Blood Glucose Monitoring Suppl (ONETOUCH VERIO REFLECT) w/Device KIT Use to check blood sugar daily. 1 kit 0   glimepiride (AMARYL) 4 MG tablet Take 1 tablet (4 mg total) by mouth 2 (two) times daily. 180 tablet 1   glucose blood test strip Use as instructed to check blood sugar daily. 100 each 2   hydrochlorothiazide (HYDRODIURIL) 25 MG tablet Take 1 tablet (25 mg total) by mouth daily. 90 tablet 1   ibuprofen (ADVIL) 800 MG tablet Take 1 tablet (800 mg total) by mouth every 8 (eight) hours as needed. 30 tablet 0   Lancets (ONETOUCH DELICA PLUS 123XX123) MISC Use as instructed. Check blood glucose level by fingerstick twice per day. 100 each 1   lisinopril (ZESTRIL) 40 MG tablet TAKE 1 TABLET BY MOUTH EVERY DAY 90 tablet 1   metFORMIN (GLUCOPHAGE) 500 MG tablet TAKE 1 TABLET BY MOUTH 2 TIMES DAILY WITH A MEAL. 180 tablet 0   pantoprazole (PROTONIX) 40 MG tablet TAKE 1 TABLET BY MOUTH EVERY DAY 90 tablet 1   Vitamin D, Ergocalciferol, (DRISDOL) 1.25 MG (50000 UNIT) CAPS capsule TAKE 1 CAPSULE (50,000 UNITS  TOTAL) BY MOUTH EVERY 7 (SEVEN) DAYS 12 capsule 0   No facility-administered medications prior to visit.    Allergies  Allergen Reactions   Penicillins Rash and Swelling    Did it involve swelling of the face/tongue/throat, SOB, or low BP? y  Did it involve sudden or severe rash/hives, skin peeling, or any reaction on the inside of your mouth or nose? y  Did you need to seek medical attention at a hospital or doctor's office? y  When did it last happen? 2026        If all above answers are "NO", may proceed with cephalosporin use.       Objective:    BP 135/80   Pulse 98   Ht 4\' 11"  (1.499 m)  Wt 166 lb 9.6 oz (75.6 kg)   SpO2 99%   BMI 33.65 kg/m  Wt Readings from Last 3 Encounters:  03/09/23 166 lb 9.6 oz (75.6 kg)  09/18/22 167 lb 9.6 oz (76 kg)  03/12/22 171 lb 4.8 oz (77.7 kg)    Physical Exam Vitals and nursing note reviewed.  Constitutional:      Appearance: She is well-developed.  HENT:     Head: Normocephalic and atraumatic.  Cardiovascular:     Rate and Rhythm: Normal rate and regular rhythm.     Heart sounds: Normal heart sounds. No murmur heard.    No friction rub. No gallop.  Pulmonary:     Effort: Pulmonary effort is normal. No tachypnea or respiratory distress.     Breath sounds: Normal breath sounds. No decreased breath sounds, wheezing, rhonchi or rales.  Chest:     Chest wall: No tenderness.  Abdominal:     General: Bowel sounds are normal.     Palpations: Abdomen is soft.  Musculoskeletal:        General: Normal range of motion.     Cervical back: Normal range of motion.  Skin:    General: Skin is warm and dry.  Neurological:     Mental Status: She is alert and oriented to person, place, and time.     Coordination: Coordination normal.  Psychiatric:        Behavior: Behavior normal. Behavior is cooperative.        Thought Content: Thought content normal.        Judgment: Judgment normal.          Patient has been counseled  extensively about nutrition and exercise as well as the importance of adherence with medications and regular follow-up. The patient was given clear instructions to go to ER or return to medical center if symptoms don't improve, worsen or new problems develop. The patient verbalized understanding.   Follow-up: Return in about 6 months (around 09/09/2023).   Gildardo Pounds, FNP-BC [facility] and [facility] [place], [place]   03/09/2023, 9:40 AM

## 2023-03-10 LAB — CMP14+EGFR
ALT: 19 IU/L (ref 0–32)
AST: 18 IU/L (ref 0–40)
Albumin/Globulin Ratio: 1.6 (ref 1.2–2.2)
Albumin: 4.9 g/dL (ref 3.9–4.9)
Alkaline Phosphatase: 118 IU/L (ref 44–121)
BUN/Creatinine Ratio: 20 (ref 12–28)
BUN: 30 mg/dL — ABNORMAL HIGH (ref 8–27)
Bilirubin Total: 0.2 mg/dL (ref 0.0–1.2)
CO2: 19 mmol/L — ABNORMAL LOW (ref 20–29)
Calcium: 9.7 mg/dL (ref 8.7–10.3)
Chloride: 99 mmol/L (ref 96–106)
Creatinine, Ser: 1.49 mg/dL — ABNORMAL HIGH (ref 0.57–1.00)
Globulin, Total: 3 g/dL (ref 1.5–4.5)
Glucose: 113 mg/dL — ABNORMAL HIGH (ref 70–99)
Potassium: 4.1 mmol/L (ref 3.5–5.2)
Sodium: 137 mmol/L (ref 134–144)
Total Protein: 7.9 g/dL (ref 6.0–8.5)
eGFR: 39 mL/min/{1.73_m2} — ABNORMAL LOW (ref >59)

## 2023-03-10 LAB — CBC WITH DIFFERENTIAL/PLATELET
Basophils Absolute: 0.2 10*3/uL (ref 0.0–0.2)
Basos: 1 %
EOS (ABSOLUTE): 0.2 10*3/uL (ref 0.0–0.4)
Eos: 1 %
Hematocrit: 34.3 % (ref 34.0–46.6)
Hemoglobin: 10.3 g/dL — ABNORMAL LOW (ref 11.1–15.9)
Immature Grans (Abs): 0 10*3/uL (ref 0.0–0.1)
Immature Granulocytes: 0 %
Lymphocytes Absolute: 6.6 10*3/uL — ABNORMAL HIGH (ref 0.7–3.1)
Lymphs: 43 %
MCH: 22.6 pg — ABNORMAL LOW (ref 26.6–33.0)
MCHC: 30 g/dL — ABNORMAL LOW (ref 31.5–35.7)
MCV: 75 fL — ABNORMAL LOW (ref 79–97)
Monocytes Absolute: 1.1 10*3/uL — ABNORMAL HIGH (ref 0.1–0.9)
Monocytes: 7 %
Neutrophils Absolute: 7.3 10*3/uL — ABNORMAL HIGH (ref 1.4–7.0)
Neutrophils: 48 %
Platelets: 379 10*3/uL (ref 150–450)
RBC: 4.56 x10E6/uL (ref 3.77–5.28)
RDW: 16.3 % — ABNORMAL HIGH (ref 11.7–15.4)
WBC: 15.4 10*3/uL — ABNORMAL HIGH (ref 3.4–10.8)

## 2023-03-10 LAB — MICROALBUMIN / CREATININE URINE RATIO
Creatinine, Urine: 42 mg/dL
Microalb/Creat Ratio: 9 mg/g creat (ref 0–29)
Microalbumin, Urine: 3.7 ug/mL

## 2023-03-10 LAB — LIPID PANEL
Chol/HDL Ratio: 4.2 ratio (ref 0.0–4.4)
Cholesterol, Total: 221 mg/dL — ABNORMAL HIGH (ref 100–199)
HDL: 53 mg/dL (ref >39)
LDL Chol Calc (NIH): 128 mg/dL — ABNORMAL HIGH (ref 0–99)
Triglycerides: 225 mg/dL — ABNORMAL HIGH (ref 0–149)
VLDL Cholesterol Cal: 40 mg/dL (ref 5–40)

## 2023-03-13 ENCOUNTER — Ambulatory Visit: Payer: Medicare HMO | Attending: Nurse Practitioner

## 2023-03-13 VITALS — Ht 59.0 in | Wt 166.0 lb

## 2023-03-13 DIAGNOSIS — Z0000 Encounter for general adult medical examination without abnormal findings: Secondary | ICD-10-CM | POA: Diagnosis not present

## 2023-03-13 NOTE — Patient Instructions (Signed)
[name] , Thank you for taking time to come for your Medicare Wellness Visit. I appreciate your ongoing commitment to your health goals. Please review the following plan we discussed and let me know if I can assist you in the future.   These are the goals we discussed:  Goals      Patient Stated     03/13/2023, wants to lose weight        This is a list of the screening recommended for you and due dates:  Health Maintenance  Topic Date Due   COVID-19 Vaccine (1) Never done   Complete foot exam   Never done   Mammogram  09/26/2021   DEXA scan (bone density measurement)  Never done   Hemoglobin A1C  09/09/2023   Eye exam for diabetics  02/19/2024   Yearly kidney function blood test for diabetes  03/08/2024   Yearly kidney health urinalysis for diabetes  03/08/2024   Medicare Annual Wellness Visit  03/12/2024   DTaP/Tdap/Td vaccine (2 - Td or Tdap) 08/08/2029   Colon Cancer Screening  04/06/2030   Pneumonia Vaccine  Completed   Flu Shot  Completed   Hepatitis C Screening: USPSTF Recommendation to screen - Ages 18-79 yo.  Completed   Zoster (Shingles) Vaccine  Completed   HPV Vaccine  Aged Out    Advanced directives: Advance directive discussed with you today.   Conditions/risks identified: none  Next appointment: Follow up in one year for your annual wellness visit    Preventive Care 65 Years and Older, Female Preventive care refers to lifestyle choices and visits with your health care provider that can promote health and wellness. What does preventive care include? A yearly physical exam. This is also called an annual well check. Dental exams once or twice a year. Routine eye exams. Ask your health care provider how often you should have your eyes checked. Personal lifestyle choices, including: Daily care of your teeth and gums. Regular physical activity. Eating a healthy diet. Avoiding tobacco and drug use. Limiting alcohol use. Practicing safe sex. Taking  low-dose aspirin every day. Taking vitamin and mineral supplements as recommended by your health care provider. What happens during an annual well check? The services and screenings done by your health care provider during your annual well check will depend on your age, overall health, lifestyle risk factors, and family history of disease. Counseling  Your health care provider may ask you questions about your: Alcohol use. Tobacco use. Drug use. Emotional well-being. Home and relationship well-being. Sexual activity. Eating habits. History of falls. Memory and ability to understand (cognition). Work and work Statistician. Reproductive health. Screening  You may have the following tests or measurements: Height, weight, and BMI. Blood pressure. Lipid and cholesterol levels. These may be checked every 5 years, or more frequently if you are over 61 years old. Skin check. Lung cancer screening. You may have this screening every year starting at age 28 if you have a 30-pack-year history of smoking and currently smoke or have quit within the past 15 years. Fecal occult blood test (FOBT) of the stool. You may have this test every year starting at age 81. Flexible sigmoidoscopy or colonoscopy. You may have a sigmoidoscopy every 5 years or a colonoscopy every 10 years starting at age 91. Hepatitis C blood test. Hepatitis B blood test. Sexually transmitted disease (STD) testing. Diabetes screening. This is done by checking your blood sugar (glucose) after you have not eaten for a while (fasting). You  may have this done every 1-3 years. Bone density scan. This is done to screen for osteoporosis. You may have this done starting at age 49. Mammogram. This may be done every 1-2 years. Talk to your health care provider about how often you should have regular mammograms. Talk with your health care provider about your test results, treatment options, and if necessary, the need for more tests. Vaccines   Your health care provider may recommend certain vaccines, such as: Influenza vaccine. This is recommended every year. Tetanus, diphtheria, and acellular pertussis (Tdap, Td) vaccine. You may need a Td booster every 10 years. Zoster vaccine. You may need this after age 53. Pneumococcal 13-valent conjugate (PCV13) vaccine. One dose is recommended after age 31. Pneumococcal polysaccharide (PPSV23) vaccine. One dose is recommended after age 2. Talk to your health care provider about which screenings and vaccines you need and how often you need them. This information is not intended to replace advice given to you by your health care provider. Make sure you discuss any questions you have with your health care provider. Document Released: 01/04/2016 Document Revised: 08/27/2016 Document Reviewed: 10/09/2015 Elsevier Interactive Patient Education  2017 [place] Prevention in the Home Falls can cause injuries. They can happen to people of all ages. There are many things you can do to make your home safe and to help prevent falls. What can I do on the outside of my home? Regularly fix the edges of walkways and driveways and fix any cracks. Remove anything that might make you trip as you walk through a door, such as a raised step or threshold. Trim any bushes or trees on the path to your home. Use bright outdoor lighting. Clear any walking paths of anything that might make someone trip, such as rocks or tools. Regularly check to see if handrails are loose or broken. Make sure that both sides of any steps have handrails. Any raised decks and porches should have guardrails on the edges. Have any leaves, snow, or ice cleared regularly. Use sand or salt on walking paths during winter. Clean up any spills in your garage right away. This includes oil or grease spills. What can I do in the bathroom? Use night lights. Install grab bars by the toilet and in the tub and shower. Do not use towel  bars as grab bars. Use non-skid mats or decals in the tub or shower. If you need to sit down in the shower, use a plastic, non-slip stool. Keep the floor dry. Clean up any water that spills on the floor as soon as it happens. Remove soap buildup in the tub or shower regularly. Attach bath mats securely with double-sided non-slip rug tape. Do not have throw rugs and other things on the floor that can make you trip. What can I do in the bedroom? Use night lights. Make sure that you have a light by your bed that is easy to reach. Do not use any sheets or blankets that are too big for your bed. They should not hang down onto the floor. Have a firm chair that has side arms. You can use this for support while you get dressed. Do not have throw rugs and other things on the floor that can make you trip. What can I do in the kitchen? Clean up any spills right away. Avoid walking on wet floors. Keep items that you use a lot in easy-to-reach places. If you need to reach something above you, use a strong  step stool that has a grab bar. Keep electrical cords out of the way. Do not use floor polish or wax that makes floors slippery. If you must use wax, use non-skid floor wax. Do not have throw rugs and other things on the floor that can make you trip. What can I do with my stairs? Do not leave any items on the stairs. Make sure that there are handrails on both sides of the stairs and use them. Fix handrails that are broken or loose. Make sure that handrails are as long as the stairways. Check any carpeting to make sure that it is firmly attached to the stairs. Fix any carpet that is loose or worn. Avoid having throw rugs at the top or bottom of the stairs. If you do have throw rugs, attach them to the floor with carpet tape. Make sure that you have a light switch at the top of the stairs and the bottom of the stairs. If you do not have them, ask someone to add them for you. What else can I do to help  prevent falls? Wear shoes that: Do not have high heels. Have rubber bottoms. Are comfortable and fit you well. Are closed at the toe. Do not wear sandals. If you use a stepladder: Make sure that it is fully opened. Do not climb a closed stepladder. Make sure that both sides of the stepladder are locked into place. Ask someone to hold it for you, if possible. Clearly mark and make sure that you can see: Any grab bars or handrails. First and last steps. Where the edge of each step is. Use tools that help you move around (mobility aids) if they are needed. These include: Canes. Walkers. Scooters. Crutches. Turn on the lights when you go into a dark area. Replace any light bulbs as soon as they burn out. Set up your furniture so you have a clear path. Avoid moving your furniture around. If any of your floors are uneven, fix them. If there are any pets around you, be aware of where they are. Review your medicines with your doctor. Some medicines can make you feel dizzy. This can increase your chance of falling. Ask your doctor what other things that you can do to help prevent falls. This information is not intended to replace advice given to you by your health care provider. Make sure you discuss any questions you have with your health care provider. Document Released: 10/04/2009 Document Revised: 05/15/2016 Document Reviewed: 01/12/2015 Elsevier Interactive Patient Education  2017 [employer].

## 2023-03-13 NOTE — Progress Notes (Signed)
I connected with  Tallia Krach on 03/13/23 by a audio enabled telemedicine application and verified that I am speaking with the correct person using two identifiers.  Patient Location: Home  Provider Location: Office/Clinic  I discussed the limitations of evaluation and management by telemedicine. The patient expressed understanding and agreed to proceed.  Subjective:   Maria Robinson is a 67 y.o. female who presents for an Initial Medicare Annual Wellness Visit.  Review of Systems     Cardiac Risk Factors include: advanced age (>14men, >54 women);diabetes mellitus;hypertension;obesity (BMI >30kg/m2)     Objective:    Today's Vitals   03/13/23 0926  Weight: 166 lb (75.3 kg)  Height: 4\' 11"  (1.499 m)   Body mass index is 33.53 kg/m.     03/13/2023    9:30 AM 05/18/2022    9:50 AM 06/09/2019   11:35 AM 10/10/2014   11:41 AM  Advanced Directives  Does Patient Have a Medical Advance Directive? No No No No  Would patient like information on creating a medical advance directive?   No - Patient declined No - patient declined information    Current Medications (verified) Outpatient Encounter Medications as of 03/13/2023  Medication Sig   Accu-Chek Softclix Lancets lancets Use as instructed. Check blood glucose level by fingerstick twice per day.   Accu-Chek Softclix Lancets lancets Use as instructed. Check blood glucose level by fingerstick twice per day.   amLODipine (NORVASC) 10 MG tablet Take 1 tablet (10 mg total) by mouth daily.   atorvastatin (LIPITOR) 40 MG tablet Take 1 tablet (40 mg total) by mouth daily.   Blood Glucose Monitoring Suppl (ACCU-CHEK GUIDE ME) w/Device KIT Use as instructed. Check blood glucose level by fingerstick twice per day.   Blood Glucose Monitoring Suppl (ONETOUCH VERIO REFLECT) w/Device KIT Use to check blood sugar daily.   Blood Glucose Monitoring Suppl DEVI 1 each by Does not apply route 2 (two) times daily. May substitute to any  manufacturer covered by patient's insurance.   glimepiride (AMARYL) 4 MG tablet Take 1 tablet (4 mg total) by mouth 2 (two) times daily.   glucose blood (ACCU-CHEK GUIDE) test strip Use as instructed. Check blood glucose by fingerstick twice per day.   Glucose Blood (BLOOD GLUCOSE TEST STRIPS) STRP 1 each by In Vitro route 2 (two) times daily. May substitute to any manufacturer covered by patient's insurance.   glucose blood test strip Use as instructed to check blood sugar daily.   hydrochlorothiazide (HYDRODIURIL) 25 MG tablet Take 1 tablet (25 mg total) by mouth daily.   ibuprofen (ADVIL) 800 MG tablet Take 1 tablet (800 mg total) by mouth every 8 (eight) hours as needed.   Lancet Device MISC 1 each by Does not apply route 2 (two) times daily. May substitute to any manufacturer covered by patient's insurance.   Lancets Misc. MISC 1 each by Does not apply route 2 (two) times daily. May substitute to any manufacturer covered by patient's insurance.   lisinopril (ZESTRIL) 40 MG tablet TAKE 1 TABLET BY MOUTH EVERY DAY   metFORMIN (GLUCOPHAGE) 500 MG tablet TAKE 1 TABLET BY MOUTH 2 TIMES DAILY WITH A MEAL.   pantoprazole (PROTONIX) 40 MG tablet TAKE 1 TABLET BY MOUTH EVERY DAY   Vitamin D, Ergocalciferol, (DRISDOL) 1.25 MG (50000 UNIT) CAPS capsule TAKE 1 CAPSULE (50,000 UNITS TOTAL) BY MOUTH EVERY 7 (SEVEN) DAYS   No facility-administered encounter medications on file as of 03/13/2023.    Allergies (verified) Penicillins   History: Past  Medical History:  Diagnosis Date   Allergy    Cataract    Diabetes mellitus without complication ([place])    Hypertension    Low blood potassium    Sickle cell trait [facility])    Past Surgical History:  Procedure Laterality Date   BIOPSY  06/13/2019   Procedure: BIOPSY;  Surgeon: Jackquline Denmark, MD;  Location: [facility];  Service: Endoscopy;;   CESAREAN SECTION     x 3   ESOPHAGOGASTRODUODENOSCOPY (EGD) WITH PROPOFOL N/A 06/13/2019   Procedure:  ESOPHAGOGASTRODUODENOSCOPY (EGD) WITH PROPOFOL;  Surgeon: Jackquline Denmark, MD;  Location: [facility];  Service: Endoscopy;  Laterality: N/A;   HAND SURGERY Right    MALONEY DILATION  06/13/2019   Procedure: MALONEY DILATION;  Surgeon: Jackquline Denmark, MD;  Location: [facility];  Service: Endoscopy;;   Family History  Problem Relation Age of Onset   Diabetes Father    Colon cancer Neg Hx    Colon polyps Neg Hx    Esophageal cancer Neg Hx    Rectal cancer Neg Hx    Stomach cancer Neg Hx    Social History   Socioeconomic History   Marital status: Single    Spouse name: Not on file   Number of children: Not on file   Years of education: Not on file   Highest education level: Not on file  Occupational History   Not on file  Tobacco Use   Smoking status: Never   Smokeless tobacco: Never  Vaping Use   Vaping Use: Never used  Substance and Sexual Activity   Alcohol use: No   Drug use: No   Sexual activity: Not Currently  Other Topics Concern   Not on file  Social History Narrative   Not on file   Social Determinants of Health   Financial Resource Strain: Low Risk  (03/13/2023)   Overall Financial Resource Strain (CARDIA)    Difficulty of Paying Living Expenses: Not hard at all  Food Insecurity: No Food Insecurity (03/13/2023)   Hunger Vital Sign    Worried About Running Out of Food in the Last Year: Never true    Ran Out of Food in the Last Year: Never true  Transportation Needs: No Transportation Needs (03/13/2023)   PRAPARE - Hydrologist (Medical): No    Lack of Transportation (Non-Medical): No  Physical Activity: Inactive (03/13/2023)   Exercise Vital Sign    Days of Exercise per Week: 0 days    Minutes of Exercise per Session: 0 min  Stress: No Stress Concern Present (03/13/2023)   [place]    Feeling of Stress : Not at all  Social Connections: Not on file    Tobacco  Counseling Counseling given: Not Answered   Clinical Intake:  Pre-visit preparation completed: Yes  Pain : No/denies pain     Nutritional Status: BMI > 30  Obese Nutritional Risks: None Diabetes: Yes  How often do you need to have someone help you when you read instructions, pamphlets, or other written materials from your doctor or pharmacy?: 1 - Never  Diabetic? Yes Nutrition Risk Assessment:  Has the patient had any N/V/D within the last 2 months?  No  Does the patient have any non-healing wounds?  No  Has the patient had any unintentional weight loss or weight gain?  No   Diabetes:  Is the patient diabetic?  Yes  If diabetic, was a CBG obtained today?  No  Did the patient bring in their glucometer from home?  No  How often do you monitor your CBG's? Twice daily.   Financial Strains and Diabetes Management:  Are you having any financial strains with the device, your supplies or your medication? No .  Does the patient want to be seen by Chronic Care Management for management of their diabetes?  No  Would the patient like to be referred to a Nutritionist or for Diabetic Management?  No   Diabetic Exams:  Diabetic Eye Exam: Completed 02/18/2023 Diabetic Foot Exam: Overdue, Pt has been advised about the importance in completing this exam. Pt is scheduled for diabetic foot exam on next appointment.   Interpreter Needed?: No  Information entered by :: NAllen LPN   Activities of Daily Living    03/13/2023    9:31 AM  In your present state of health, do you have any difficulty performing the following activities:  Hearing? 0  Vision? 0  Difficulty concentrating or making decisions? 0  Walking or climbing stairs? 0  Dressing or bathing? 0  Doing errands, shopping? 0  Preparing Food and eating ? N  Using the Toilet? N  In the past six months, have you accidently leaked urine? N  Do you have problems with loss of bowel control? N  Managing your Medications? N   Managing your Finances? N  Housekeeping or managing your Housekeeping? N    Patient Care Team: Gildardo Pounds, NP as PCP - General (Nurse Practitioner)  Indicate any recent Medical Services you may have received from other than [facility] providers in the past year (date may be approximate).     Assessment:   This is a routine wellness examination for Maria Robinson.  Hearing/Vision screen Vision Screening - Comments:: Regular eye exams, [facility]  Dietary issues and exercise activities discussed: Current Exercise Habits: The patient does not participate in regular exercise at present   Goals Addressed             This Visit's Progress    Patient Stated       03/13/2023, wants to lose weight       Depression Screen    03/13/2023    9:31 AM 03/09/2023    9:38 AM 09/18/2022   10:07 AM 01/29/2022    9:10 AM 04/03/2021   11:06 AM 11/12/2020   11:25 AM 06/01/2020    9:45 AM  PHQ 2/9 Scores  PHQ - 2 Score 0 1 0 0 3 3 0  PHQ- 9 Score  3 0 2 4 6      Fall Risk    03/13/2023    9:30 AM 03/09/2023    9:38 AM 09/18/2022   10:03 AM 04/03/2021   11:06 AM 01/12/2020    3:16 PM  Fall Risk   Falls in the past year? 0 0 0 0 0  Number falls in past yr: 0 0 0 0   Injury with Fall? 0 0 0 0   Risk for fall due to : Medication side effect No Fall Risks     Follow up Falls prevention discussed;Education provided;Falls evaluation completed Falls evaluation completed       FALL RISK PREVENTION PERTAINING TO THE HOME:  Any stairs in or around the home? No  If so, are there any without handrails? N/a Home free of loose throw rugs in walkways, pet beds, electrical cords, etc? Yes  Adequate lighting in your home to reduce risk of falls? Yes   ASSISTIVE DEVICES  UTILIZED TO PREVENT FALLS:  Life alert? No  Use of a cane, walker or w/c? No  Grab bars in the bathroom? No  Shower chair or bench in shower? No  Elevated toilet seat or a handicapped toilet? No   TIMED UP AND GO:  Was the test  performed? No .      Cognitive Function:        03/13/2023    9:32 AM  6CIT Screen  What Year? 0 points  What month? 0 points  What time? 0 points  Count back from 20 0 points  Months in reverse 0 points  Repeat phrase 4 points  Total Score 4 points    Immunizations Immunization History  Administered Date(s) Administered   Fluad Quad(high Dose 65+) 09/18/2022   Influenza,inj,Quad PF,6+ Mos 09/09/2019, 11/12/2020, 10/07/2021   PNEUMOCOCCAL CONJUGATE-20 01/29/2022   PPD Test 04/09/2021   Pneumococcal Polysaccharide-23 08/09/2019   Tdap 08/09/2019   Zoster Recombinat (Shingrix) 04/28/2022, 02/19/2023    TDAP status: Up to date  Flu Vaccine status: Up to date  Pneumococcal vaccine status: Up to date  Covid-19 vaccine status: Declined, Education has been provided regarding the importance of this vaccine but patient still declined. Advised may receive this vaccine at local pharmacy or Health Dept.or vaccine clinic. Aware to provide a copy of the vaccination record if obtained from local pharmacy or Health Dept. Verbalized acceptance and understanding.  Qualifies for Shingles Vaccine? Yes   Zostavax completed Yes   Shingrix Completed?: Yes  Screening Tests Health Maintenance  Topic Date Due   Medicare Annual Wellness (AWV)  Never done   COVID-19 Vaccine (1) Never done   FOOT EXAM  Never done   MAMMOGRAM  09/26/2021   DEXA SCAN  Never done   HEMOGLOBIN A1C  09/09/2023   OPHTHALMOLOGY EXAM  02/19/2024   Diabetic kidney evaluation - eGFR measurement  03/08/2024   Diabetic kidney evaluation - Urine ACR  03/08/2024   DTaP/Tdap/Td (2 - Td or Tdap) 08/08/2029   COLONOSCOPY (Pts 45-43yrs Insurance coverage will need to be confirmed)  04/06/2030   Pneumonia Vaccine 52+ Years old  Completed   INFLUENZA VACCINE  Completed   Hepatitis C Screening  Completed   Zoster Vaccines- Shingrix  Completed   HPV VACCINES  Aged Out    Health Maintenance  Health Maintenance Due   Topic Date Due   Medicare Annual Wellness (AWV)  Never done   COVID-19 Vaccine (1) Never done   FOOT EXAM  Never done   MAMMOGRAM  09/26/2021   DEXA SCAN  Never done    Colorectal cancer screening: Type of screening: Colonoscopy. Completed 04/06/2020. Repeat every 10 years  Mammogram status: scheduled for 04/23/2023  Bone Density status: scheduled for 09/08/2023  Lung Cancer Screening: (Low Dose CT Chest recommended if Age 55-80 years, 30 pack-year currently smoking OR have quit w/in 15years.) does not qualify.   Lung Cancer Screening Referral: no  Additional Screening:  Hepatitis C Screening: does qualify; Completed 01/29/2022  Vision Screening: Recommended annual ophthalmology exams for early detection of glaucoma and other disorders of the eye. Is the patient up to date with their annual eye exam?  Yes  Who is the provider or what is the name of the office in which the patient attends annual eye exams? [facility] If pt is not established with a provider, would they like to be referred to a provider to establish care? No .   Dental Screening: Recommended annual dental exams for  proper oral hygiene  Community Resource Referral / Chronic Care Management: CRR required this visit?  No   CCM required this visit?  No      Plan:     I have personally reviewed and noted the following in the patient's chart:   Medical and social history Use of alcohol, tobacco or illicit drugs  Current medications and supplements including opioid prescriptions. Patient is not currently taking opioid prescriptions. Functional ability and status Nutritional status Physical activity Advanced directives List of other physicians Hospitalizations, surgeries, and ER visits in previous 12 months Vitals Screenings to include cognitive, depression, and falls Referrals and appointments  In addition, I have reviewed and discussed with patient certain preventive protocols, quality metrics, and best  practice recommendations. A written personalized care plan for preventive services as well as general preventive health recommendations were provided to patient.     Kellie Simmering, LPN   [id number]   Nurse Notes: none  Due to this being a virtual visit, the after visit summary with patients personalized plan was offered to patient via mail or my-chart. Patient would like to access on my-chart

## 2023-03-16 ENCOUNTER — Other Ambulatory Visit: Payer: Self-pay | Admitting: Nurse Practitioner

## 2023-03-16 DIAGNOSIS — I10 Essential (primary) hypertension: Secondary | ICD-10-CM

## 2023-03-17 ENCOUNTER — Ambulatory Visit: Payer: Self-pay | Admitting: *Deleted

## 2023-03-17 NOTE — Telephone Encounter (Signed)
Attempted to return her call.   Left a voicemail to call back to discuss symptoms with a nurse. 

## 2023-03-17 NOTE — Telephone Encounter (Signed)
Reason for Disposition  [1] Fever returns after gone for over 24 hours AND [2] symptoms worse or not improved  Answer Assessment - Initial Assessment Questions 1. ONST: "When did the cough begin?"      Throat sore, congestion in my head, coughing non productive.   Pressure in both ears.   It started Thursday.    Had fever at first but not now.   No body aches.    2. SEVERITY: "How bad is the cough today?"      Non productive 3. SPUTUM: "Describe the color of your sputum" (none, dry cough; clear, white, yellow, green)     Nothing coming up 4. HEMOPTYSIS: "Are you coughing up any blood?" If so ask: "How much?" (flecks, streaks, tablespoons, etc.)     Not asked 5. DIFFICULTY BREATHING: "Are you having difficulty breathing?" If Yes, ask: "How bad is it?" (e.g., mild, moderate, severe)    - MILD: No SOB at rest, mild SOB with walking, speaks normally in sentences, can lie down, no retractions, pulse < 100.    - MODERATE: SOB at rest, SOB with minimal exertion and prefers to sit, cannot lie down flat, speaks in phrases, mild retractions, audible wheezing, pulse 100-120.    - SEVERE: Very SOB at rest, speaks in single words, struggling to breathe, sitting hunched forward, retractions, pulse > 120      Taking OTC medicine but it's not helping.   I have diabetes.    6. FEVER: "Do you have a fever?" If Yes, ask: "What is your temperature, how was it measured, and when did it start?"     Not now 7. CARDIAC HISTORY: "Do you have any history of heart disease?" (e.g., heart attack, congestive heart failure)      Diabetes    No heart history 8. LUNG HISTORY: "Do you have any history of lung disease?"  (e.g., pulmonary embolus, asthma, emphysema)     No 9. PE RISK FACTORS: "Do you have a history of blood clots?" (or: recent major surgery, recent prolonged travel, bedridden)     No 10. OTHER SYMPTOMS: "Do you have any other symptoms?" (e.g., runny nose, wheezing, chest pain)       Sore throat, congestion in  my head, non productive cough.   I'm taking cough syrup and cold and flu medicine. 11. PREGNANCY: "Is there any chance you are pregnant?" "When was your last menstrual period?"       N/A 12. TRAVEL: "Have you traveled out of the country in the last month?" (e.g., travel history, exposures)       N/A  Protocols used: Cough - Acute Non-Productive-A-AH  Chief Complaint: Nasal congestion, non productive cough, sore throat and pressure in both ears. Symptoms: See above Frequency: Started Thursday Pertinent Negatives: Patient denies Shortness of breath or fever.   Had fever to begin with but not now. Disposition: [] ED /[x] Urgent Care (no appt availability in office) / [] Appointment(In office/virtual)/ []  [facility]/ [] Home Care/ [] Refused Recommended Disposition /[] [facility]/ []  Follow-up with PCP Additional Notes: No appts. Available with any of the providers at [facility] until April 02, 2023 so I have referred pt to the urgent care.    She did not want to do a virtual visit.   I offered the [facility] which is in [place] today however she lives in [place].   "I live in [place] but all my doctors are in [place]".  She is going to the urgent care there in [facility].

## 2023-03-17 NOTE — Telephone Encounter (Signed)
Noted  

## 2023-03-17 NOTE — Telephone Encounter (Signed)
Message from Sorento Callas sent at 03/17/2023  2:56 PM EDT  Summary: appt for congestion   Pt called asking for an appt with Dr. Raul Del .  There is nothing available.  She has a cough, congestion .  Please advise [phone]          Call History   Type Contact Phone/Fax User  03/17/2023 02:55 PM EDT Phone (Incoming) [place], [place] (Self)  Greggory Keen D

## 2023-04-02 ENCOUNTER — Ambulatory Visit: Payer: Medicare HMO | Attending: Physician Assistant | Admitting: Physician Assistant

## 2023-04-02 ENCOUNTER — Encounter: Payer: Self-pay | Admitting: Physician Assistant

## 2023-04-02 VITALS — BP 126/79 | HR 86 | Wt 166.2 lb

## 2023-04-02 DIAGNOSIS — B9689 Other specified bacterial agents as the cause of diseases classified elsewhere: Secondary | ICD-10-CM | POA: Diagnosis not present

## 2023-04-02 DIAGNOSIS — J329 Chronic sinusitis, unspecified: Secondary | ICD-10-CM | POA: Diagnosis not present

## 2023-04-02 DIAGNOSIS — R058 Other specified cough: Secondary | ICD-10-CM | POA: Diagnosis not present

## 2023-04-02 DIAGNOSIS — E1165 Type 2 diabetes mellitus with hyperglycemia: Secondary | ICD-10-CM | POA: Diagnosis not present

## 2023-04-02 LAB — GLUCOSE, POCT (MANUAL RESULT ENTRY): POC Glucose: 96 mg/dl (ref 70–99)

## 2023-04-02 MED ORDER — AZITHROMYCIN 250 MG PO TABS: ORAL_TABLET | ORAL | 0 refills | Status: AC

## 2023-04-02 MED ORDER — PROMETHAZINE-DM 6.25-15 MG/5ML PO SYRP: 5.0000 mL | ORAL_SOLUTION | Freq: Every evening | ORAL | 0 refills | Status: DC | PRN

## 2023-04-02 MED ORDER — FLUTICASONE PROPIONATE 50 MCG/ACT NA SUSP: 2.0000 | Freq: Every day | NASAL | 6 refills | Status: DC

## 2023-04-02 NOTE — Progress Notes (Signed)
Patient ID: Maria Robinson, female   DOB: 06-29-1956, 67 y.o.   MRN: [id number]     Maria Robinson, is a 67 y.o. female  RCB:[id number]  OEH:[id number]  DOB - 26-Apr-1956  Chief Complaint  Patient presents with   Cough   Nasal Congestion       Subjective:   Maria Robinson is a 67 y.o. female here today for a follow up visit and to establish care. Patient has No headache, No chest pain, No abdominal pain - No Nausea, No new weakness tingling or numbness, No Cough - SOB.  Onset: Location: Duration: Characterization/quality: Alleviating/aggravating: Radiation: Severity:  No problems updated.  ALLERGIES: Allergies  Allergen Reactions   Penicillins Rash and Swelling    Did it involve swelling of the face/tongue/throat, SOB, or low BP? y  Did it involve sudden or severe rash/hives, skin peeling, or any reaction on the inside of your mouth or nose? y  Did you need to seek medical attention at a hospital or doctor's office? y  When did it last happen? 2026        If all above answers are "NO", may proceed with cephalosporin use.    PAST MEDICAL HISTORY: Past Medical History:  Diagnosis Date   Allergy    Cataract    Diabetes mellitus without complication    Hypertension    Low blood potassium    Sickle cell trait     MEDICATIONS AT HOME: Prior to Admission medications   Medication Sig Start Date End Date Taking? Authorizing Provider  Accu-Chek Softclix Lancets lancets Use as instructed. Check blood glucose level by fingerstick twice per day. 03/09/23  Yes Maria Rigg, NP  Accu-Chek Softclix Lancets lancets Use as instructed. Check blood glucose level by fingerstick twice per day. 03/09/23  Yes Maria Rigg, NP  amLODipine (NORVASC) 10 MG tablet Take 1 tablet (10 mg total) by mouth daily. 08/12/22  Yes Maria Rigg, NP  atorvastatin (LIPITOR) 40 MG tablet Take 1 tablet (40 mg total) by mouth daily. 08/12/22  Yes Maria Rigg, NP  azithromycin  (ZITHROMAX) 250 MG tablet Take 2 tablets on day 1, then 1 tablet daily on days 2 through 5 04/02/23 04/07/23 Yes Maria Robinson, Maria Schlein, PA-C  Blood Glucose Monitoring Suppl (ACCU-CHEK GUIDE ME) w/Device KIT Use as instructed. Check blood glucose level by fingerstick twice per day. 03/09/23  Yes Maria Rigg, NP  Blood Glucose Monitoring Suppl (ONETOUCH VERIO REFLECT) w/Device KIT Use to check blood sugar daily. 06/29/20  Yes Hoy Register, MD  Blood Glucose Monitoring Suppl DEVI 1 each by Does not apply route 2 (two) times daily. May substitute to any manufacturer covered by patient's insurance. 03/09/23  Yes Maria Rigg, NP  fluticasone (FLONASE) 50 MCG/ACT nasal spray Place 2 sprays into both nostrils daily. 04/02/23  Yes Chemika Nightengale, Maria Schlein, PA-C  glimepiride (AMARYL) 4 MG tablet Take 1 tablet (4 mg total) by mouth 2 (two) times daily. 08/12/22  Yes Maria Rigg, NP  glucose blood (ACCU-CHEK GUIDE) test strip Use as instructed. Check blood glucose by fingerstick twice per day. 03/09/23  Yes Maria Rigg, NP  Glucose Blood (BLOOD GLUCOSE TEST STRIPS) STRP 1 each by In Vitro route 2 (two) times daily. May substitute to any manufacturer covered by patient's insurance. 03/09/23  Yes Maria Rigg, NP  glucose blood test strip Use as instructed to check blood sugar daily. 01/29/22  Yes Maria Rigg, NP  hydrochlorothiazide (HYDRODIURIL) 25 MG tablet Take  1 tablet (25 mg total) by mouth daily. 08/12/22  Yes Maria Rigg, NP  ibuprofen (ADVIL) 800 MG tablet Take 1 tablet (800 mg total) by mouth every 8 (eight) hours as needed. 05/18/22  Yes Elson Areas, PA-C  Lancet Device MISC 1 each by Does not apply route 2 (two) times daily. May substitute to any manufacturer covered by patient's insurance. 03/09/23 04/08/23 Yes Maria Rigg, NP  Lancets Misc. MISC 1 each by Does not apply route 2 (two) times daily. May substitute to any manufacturer covered by patient's insurance. 03/09/23 04/08/23 Yes  Maria Rigg, NP  lisinopril (ZESTRIL) 40 MG tablet TAKE 1 TABLET BY MOUTH EVERY DAY 03/17/23  Yes Maria, Enobong, MD  metFORMIN (GLUCOPHAGE) 500 MG tablet TAKE 1 TABLET BY MOUTH 2 TIMES DAILY WITH A MEAL. 01/19/23  Yes Maria Rigg, NP  pantoprazole (PROTONIX) 40 MG tablet TAKE 1 TABLET BY MOUTH EVERY DAY 07/22/22  Yes Maria Rigg, NP  promethazine-dextromethorphan (PROMETHAZINE-DM) 6.25-15 MG/5ML syrup Take 5 mLs by mouth at bedtime as needed for cough. 04/02/23  Yes Maria Robinson, Maria Schlein, PA-C  Vitamin D, Ergocalciferol, (DRISDOL) 1.25 MG (50000 UNIT) CAPS capsule TAKE 1 CAPSULE (50,000 UNITS TOTAL) BY MOUTH EVERY 7 (SEVEN) DAYS 01/28/23  Yes Maria, Enobong, MD    ROS: Neg HEENT Neg resp Neg cardiac Neg GI Neg GU Neg MS Neg psych Neg neuro  Objective:   Vitals:   04/02/23 0907  BP: 126/79  Pulse: 86  SpO2: 99%  Weight: 166 lb 3.2 oz (75.4 kg)   Exam General appearance : Awake, alert, not in any distress. Speech Clear. Not toxic looking HEENT: Atraumatic and Normocephalic.  Appears congested.  B TM congested.  Throat with PND.  [place]urb[place]es congested B Neck: Supple, no JVD. No cervical lymphadenopathy.  Chest: Good air entry bilaterally, CTAB.  No rales/rhonchi/wheezing CVS: S1 S2 regular, no murmurs.  Extremities: B/L Lower Ext shows no edema, both legs are warm to touch Neurology: Awake alert, and oriented X 3, CN II-XII intact, Non focal Skin: No Rash  Data Review Lab Results  Component Value Date   HGBA1C 7.5 (A) 03/09/2023   HGBA1C 7.6 (H) 08/22/2022   HGBA1C 7.8 (A) 01/29/2022    Assessment & Plan   1. Type 2 diabetes mellitus with hyperglycemia, without long-term current use of insulin Recently seen for this.  Glucose great today - Glucose (CBG)  2. Bacterial sinusitis Coverage for atypicals given 3 weeks of s/sx - azithromycin (ZITHROMAX) 250 MG tablet; Take 2 tablets on day 1, then 1 tablet daily on days 2 through 5  Dispense: 6 tablet; Refill: 0 -  fluticasone (FLONASE) 50 MCG/ACT nasal spray; Place 2 sprays into both nostrils daily.  Dispense: 16 g; Refill: 6  3. Other cough See #2 - azithromycin (ZITHROMAX) 250 MG tablet; Take 2 tablets on day 1, then 1 tablet daily on days 2 through 5  Dispense: 6 tablet; Refill: 0 - promethazine-dextromethorphan (PROMETHAZINE-DM) 6.25-15 MG/5ML syrup; Take 5 mLs by mouth at bedtime as needed for cough.  Dispense: 118 mL; Refill: 0    Return for next appt with Alishah in June.  The patient was given clear instructions to go to ER or return to medical center if symptoms don't improve, worsen or new problems develop. The patient verbalized understanding. The patient was told to call to get lab results if they haven't heard anything in the next week.      Georgian Co, PA-C [facility]  [facility] [place], [place] [phone]   04/02/2023, 9:21 AM

## 2023-04-20 ENCOUNTER — Other Ambulatory Visit: Payer: Self-pay | Admitting: Nurse Practitioner

## 2023-04-20 DIAGNOSIS — E1165 Type 2 diabetes mellitus with hyperglycemia: Secondary | ICD-10-CM

## 2023-04-23 ENCOUNTER — Ambulatory Visit
Admission: RE | Admit: 2023-04-23 | Discharge: 2023-04-23 | Disposition: A | Discharge status: Home / Self Care | Payer: Medicare HMO | Source: Ambulatory Visit | Attending: Nurse Practitioner | Admitting: Nurse Practitioner

## 2023-04-23 DIAGNOSIS — Z1231 Encounter for screening mammogram for malignant neoplasm of breast: Secondary | ICD-10-CM

## 2023-04-25 ENCOUNTER — Other Ambulatory Visit: Payer: Self-pay | Admitting: Family Medicine

## 2023-04-25 DIAGNOSIS — E559 Vitamin D deficiency, unspecified: Secondary | ICD-10-CM

## 2023-05-03 ENCOUNTER — Other Ambulatory Visit: Payer: Self-pay | Admitting: Nurse Practitioner

## 2023-05-03 DIAGNOSIS — I10 Essential (primary) hypertension: Secondary | ICD-10-CM

## 2023-05-04 NOTE — Telephone Encounter (Signed)
Requested Prescriptions  Pending Prescriptions Disp Refills   hydrochlorothiazide (HYDRODIURIL) 25 MG tablet [Pharmacy Med Name: HYDROCHLOROTHIAZIDE 25 MG TAB] 90 tablet 1    Sig: TAKE 1 TABLET (25 MG TOTAL) BY MOUTH DAILY.     Cardiovascular: Diuretics - Thiazide Failed - 05/03/2023  1:17 AM      Failed - Cr in normal range and within 180 days    Creat  Date Value Ref Range Status  08/16/2020 0.90 0.50 - 0.99 mg/dL Final    Comment:    For patients >77 years of age, the reference limit for Creatinine is approximately 13% higher for people identified as African-American. .    Creatinine, Ser  Date Value Ref Range Status  03/09/2023 1.49 (H) 0.57 - 1.00 mg/dL Final         Passed - K in normal range and within 180 days    Potassium  Date Value Ref Range Status  03/09/2023 4.1 3.5 - 5.2 mmol/L Final         Passed - Na in normal range and within 180 days    Sodium  Date Value Ref Range Status  03/09/2023 137 134 - 144 mmol/L Final         Passed - Last BP in normal range    BP Readings from Last 1 Encounters:  04/02/23 126/79         Passed - Valid encounter within last 6 months    Recent Outpatient Visits           1 month ago Type 2 diabetes mellitus with hyperglycemia, without long-term current use of insulin [facility])   [facility] [place], [place], [place]   1 month ago Type 2 diabetes mellitus with hyperglycemia, without long-term current use of insulin [facility])   [place] [place], Shea Stakes, NP   7 months ago Primary hypertension   [place] [facility] [place], Shea Stakes, NP   8 months ago Essential hypertension   [facility] [place], [place] W, NP   1 year ago Type 2 diabetes mellitus with hyperglycemia, without long-term current use of insulin [facility])   [place] [facility] [place], Shea Stakes, NP       Future  Appointments             In 1 month Claiborne Rigg, NP [facility]

## 2023-06-12 ENCOUNTER — Encounter: Payer: Self-pay | Admitting: Nurse Practitioner

## 2023-06-12 ENCOUNTER — Other Ambulatory Visit: Payer: Self-pay | Admitting: Nurse Practitioner

## 2023-06-12 ENCOUNTER — Ambulatory Visit: Payer: Medicare HMO | Attending: Nurse Practitioner | Admitting: Nurse Practitioner

## 2023-06-12 VITALS — BP 132/81 | HR 92 | Ht 59.0 in | Wt 165.2 lb

## 2023-06-12 DIAGNOSIS — Z7984 Long term (current) use of oral hypoglycemic drugs: Secondary | ICD-10-CM

## 2023-06-12 DIAGNOSIS — E1165 Type 2 diabetes mellitus with hyperglycemia: Secondary | ICD-10-CM

## 2023-06-12 DIAGNOSIS — I10 Essential (primary) hypertension: Secondary | ICD-10-CM | POA: Diagnosis not present

## 2023-06-12 DIAGNOSIS — D72829 Elevated white blood cell count, unspecified: Secondary | ICD-10-CM | POA: Diagnosis not present

## 2023-06-12 LAB — POCT GLYCOSYLATED HEMOGLOBIN (HGB A1C): HbA1c, POC (controlled diabetic range): 6.5 % (ref 0.0–7.0)

## 2023-06-12 NOTE — Telephone Encounter (Signed)
Requested Prescriptions  Pending Prescriptions Disp Refills   amLODipine (NORVASC) 10 MG tablet [Pharmacy Med Name: AMLODIPINE BESYLATE 10 MG TAB] 90 tablet 1    Sig: TAKE 1 TABLET BY MOUTH EVERY DAY     Cardiovascular: Calcium Channel Blockers 2 Passed - 06/12/2023  2:16 AM      Passed - Last BP in normal range    BP Readings from Last 1 Encounters:  06/12/23 132/81         Passed - Last Heart Rate in normal range    Pulse Readings from Last 1 Encounters:  06/12/23 92         Passed - Valid encounter within last 6 months    Recent Outpatient Visits           Today Type 2 diabetes mellitus with hyperglycemia, without long-term current use of insulin [facility])   [place] [facility] [place], [place] W, NP   2 months ago Type 2 diabetes mellitus with hyperglycemia, without long-term current use of insulin [facility])   [place] [facility] [place], [place], [place]   3 months ago Type 2 diabetes mellitus with hyperglycemia, without long-term current use of insulin [facility])   [place] [facility] [place], Shea Stakes, NP   8 months ago Primary hypertension   [place] [facility] [place], Shea Stakes, NP   10 months ago Essential hypertension   [facility] [place], Shea Stakes, NP

## 2023-06-12 NOTE — Progress Notes (Signed)
Assessment & Plan:  Maria Robinson was seen today for diabetes.  Diagnoses and all orders for this visit:  Type 2 diabetes mellitus with hyperglycemia, without long-term current use of insulin (HCC) -     POCT glycosylated hemoglobin (Hb A1C) -     CMP14+EGFR Continue blood sugar control as discussed in office today, low carbohydrate diet, and regular physical exercise as tolerated, 150 minutes per week (30 min each day, 5 days per week, or 50 min 3 days per week). Keep blood sugar logs with fasting goal of 90-130 mg/dl, post prandial (after you eat) less than 180.  For Hypoglycemia: BS <60 and Hyperglycemia BS >400; contact the clinic ASAP. Annual eye exams and foot exams are recommended.   Leukocytosis, unspecified type -     CBC with Differential  Primary hypertension Continue all antihypertensives as prescribed.  Reminded to bring in blood pressure log for follow  up appointment.  RECOMMENDATIONS: DASH/Mediterranean Diets are healthier choices for HTN.     Patient has been counseled on age-appropriate routine health concerns for screening and prevention. These are reviewed and up-to-date. Referrals have been placed accordingly. Immunizations are up-to-date or declined.    Subjective:   Chief Complaint  Patient presents with   Diabetes    Maria Robinson 67 y.o. female presents to office today for follow up to DM and HTN.  She has a past medical history of Allergy, Cataract, Diabetes mellitus without complication, Hypertension, Low blood potassium, and Sickle cell trait     DM 2 Diabetes is well-controlled.  She is taking glimepiride 4 mg twice daily and metformin 500 mg twice daily.  A1c down from 7.5 to 6.5. Lab Results  Component Value Date   HGBA1C 6.5 06/12/2023    Lab Results  Component Value Date   HGBA1C 7.5 (A) 03/09/2023   HTN Blood pressure is well-controlled with amlodipine 10 mg daily, hydrochlorothiazide 25 mg daily and lisinopril 40 mg daily BP Readings  from Last 3 Encounters:  06/12/23 132/81  04/02/23 126/79  03/09/23 135/80     Review of Systems  Constitutional:  Negative for fever, malaise/fatigue and weight loss.  HENT: Negative.  Negative for nosebleeds.   Eyes: Negative.  Negative for blurred vision, double vision and photophobia.  Respiratory: Negative.  Negative for cough and shortness of breath.   Cardiovascular: Negative.  Negative for chest pain, palpitations and leg swelling.  Gastrointestinal: Negative.  Negative for heartburn, nausea and vomiting.  Musculoskeletal: Negative.  Negative for myalgias.  Neurological: Negative.  Negative for dizziness, focal weakness, seizures and headaches.  Psychiatric/Behavioral: Negative.  Negative for suicidal ideas.     Past Medical History:  Diagnosis Date   Allergy    Cataract    Diabetes mellitus without complication (HCC)    Hypertension    Low blood potassium    Sickle cell trait [facility]     Past Surgical History:  Procedure Laterality Date   BIOPSY  06/13/2019   Procedure: BIOPSY;  Surgeon: Lynann Bologna, MD;  Location: [facility];  Service: Endoscopy;;   CESAREAN SECTION     x 3   ESOPHAGOGASTRODUODENOSCOPY (EGD) WITH PROPOFOL N/A 06/13/2019   Procedure: ESOPHAGOGASTRODUODENOSCOPY (EGD) WITH PROPOFOL;  Surgeon: Lynann Bologna, MD;  Location: [facility];  Service: Endoscopy;  Laterality: N/A;   HAND SURGERY Right    MALONEY DILATION  06/13/2019   Procedure: MALONEY DILATION;  Surgeon: Lynann Bologna, MD;  Location: [facility];  Service: Endoscopy;;    Family History  Problem Relation Age  of Onset   Diabetes Father    Colon cancer Neg Hx    Colon polyps Neg Hx    Esophageal cancer Neg Hx    Rectal cancer Neg Hx    Stomach cancer Neg Hx     Social History Reviewed with no changes to be made today.   Outpatient Medications Prior to Visit  Medication Sig Dispense Refill   Accu-Chek Softclix Lancets lancets Use as instructed. Check blood glucose level by  fingerstick twice per day. 100 each 12   Accu-Chek Softclix Lancets lancets Use as instructed. Check blood glucose level by fingerstick twice per day. 100 each 6   amLODipine (NORVASC) 10 MG tablet Take 1 tablet (10 mg total) by mouth daily. 90 tablet 1   atorvastatin (LIPITOR) 40 MG tablet Take 1 tablet (40 mg total) by mouth daily. 90 tablet 3   Blood Glucose Monitoring Suppl (ACCU-CHEK GUIDE ME) w/Device KIT Use as instructed. Check blood glucose level by fingerstick twice per day. 1 kit 0   fluticasone (FLONASE) 50 MCG/ACT nasal spray Place 2 sprays into both nostrils daily. 16 g 6   glimepiride (AMARYL) 4 MG tablet Take 1 tablet (4 mg total) by mouth 2 (two) times daily. 180 tablet 1   glucose blood (ACCU-CHEK GUIDE) test strip Use as instructed. Check blood glucose by fingerstick twice per day. 100 each 12   glucose blood test strip Use as instructed to check blood sugar daily. 100 each 2   hydrochlorothiazide (HYDRODIURIL) 25 MG tablet TAKE 1 TABLET (25 MG TOTAL) BY MOUTH DAILY. 90 tablet 1   ibuprofen (ADVIL) 800 MG tablet Take 1 tablet (800 mg total) by mouth every 8 (eight) hours as needed. 30 tablet 0   lisinopril (ZESTRIL) 40 MG tablet TAKE 1 TABLET BY MOUTH EVERY DAY 90 tablet 0   metFORMIN (GLUCOPHAGE) 500 MG tablet TAKE 1 TABLET BY MOUTH TWICE A DAY WITH FOOD 180 tablet 0   pantoprazole (PROTONIX) 40 MG tablet TAKE 1 TABLET BY MOUTH EVERY DAY 90 tablet 1   SHINGRIX injection Inject 0.5 mLs into the muscle once.     Vitamin D, Ergocalciferol, (DRISDOL) 1.25 MG (50000 UNIT) CAPS capsule TAKE 1 CAPSULE (50,000 UNITS TOTAL) BY MOUTH EVERY 7 (SEVEN) DAYS 12 capsule 0   Blood Glucose Monitoring Suppl (ONETOUCH VERIO REFLECT) w/Device KIT Use to check blood sugar daily. 1 kit 0   Blood Glucose Monitoring Suppl DEVI 1 each by Does not apply route 2 (two) times daily. May substitute to any manufacturer covered by patient's insurance. 1 each 0   Glucose Blood (BLOOD GLUCOSE TEST STRIPS) STRP 1  each by In Vitro route 2 (two) times daily. May substitute to any manufacturer covered by patient's insurance. 100 strip 0   promethazine-dextromethorphan (PROMETHAZINE-DM) 6.25-15 MG/5ML syrup Take 5 mLs by mouth at bedtime as needed for cough. 118 mL 0   No facility-administered medications prior to visit.    Allergies  Allergen Reactions   Penicillins Rash and Swelling    Did it involve swelling of the face/tongue/throat, SOB, or low BP? y  Did it involve sudden or severe rash/hives, skin peeling, or any reaction on the inside of your mouth or nose? y  Did you need to seek medical attention at a hospital or doctor's office? y  When did it last happen? 2026        If all above answers are "NO", may proceed with cephalosporin use.       Objective:  BP 132/81 (BP Location: Left Arm, Patient Position: Sitting, Cuff Size: Normal)   Pulse 92   Ht 4\' 11"  (1.499 m)   Wt 165 lb 3.2 oz (74.9 kg)   SpO2 100%   BMI 33.37 kg/m  Wt Readings from Last 3 Encounters:  06/12/23 165 lb 3.2 oz (74.9 kg)  04/02/23 166 lb 3.2 oz (75.4 kg)  03/13/23 166 lb (75.3 kg)    Physical Exam Vitals and nursing note reviewed.  Constitutional:      Appearance: She is well-developed.  HENT:     Head: Normocephalic and atraumatic.  Cardiovascular:     Rate and Rhythm: Normal rate and regular rhythm.     Heart sounds: Normal heart sounds. No murmur heard.    No friction rub. No gallop.  Pulmonary:     Effort: Pulmonary effort is normal. No tachypnea or respiratory distress.     Breath sounds: Normal breath sounds. No decreased breath sounds, wheezing, rhonchi or rales.  Chest:     Chest wall: No tenderness.  Abdominal:     General: Bowel sounds are normal.     Palpations: Abdomen is soft.  Musculoskeletal:        General: Normal range of motion.     Cervical back: Normal range of motion.  Skin:    General: Skin is warm and dry.  Neurological:     Mental Status: She is alert and oriented  to person, place, and time.     Coordination: Coordination normal.  Psychiatric:        Behavior: Behavior normal. Behavior is cooperative.        Thought Content: Thought content normal.        Judgment: Judgment normal.          Patient has been counseled extensively about nutrition and exercise as well as the importance of adherence with medications and regular follow-up. The patient was given clear instructions to go to ER or return to medical center if symptoms don't improve, worsen or new problems develop. The patient verbalized understanding.   Follow-up: Return in about 3 months (around 09/12/2023) for see me after 09-12-2023 for DM .   Claiborne Rigg, FNP-BC [facility] [place], [place] [phone]   06/12/2023, 9:08 AM

## 2023-06-13 LAB — CBC WITH DIFFERENTIAL/PLATELET
Basophils Absolute: 0.1 10*3/uL (ref 0.0–0.2)
Basos: 1 %
EOS (ABSOLUTE): 0.2 10*3/uL (ref 0.0–0.4)
Eos: 1 %
Hematocrit: 34 % (ref 34.0–46.6)
Hemoglobin: 10.2 g/dL — ABNORMAL LOW (ref 11.1–15.9)
Immature Grans (Abs): 0 10*3/uL (ref 0.0–0.1)
Immature Granulocytes: 0 %
Lymphocytes Absolute: 5.6 10*3/uL — ABNORMAL HIGH (ref 0.7–3.1)
Lymphs: 47 %
MCH: 22 pg — ABNORMAL LOW (ref 26.6–33.0)
MCHC: 30 g/dL — ABNORMAL LOW (ref 31.5–35.7)
MCV: 73 fL — ABNORMAL LOW (ref 79–97)
Monocytes Absolute: 0.8 10*3/uL (ref 0.1–0.9)
Monocytes: 6 %
Neutrophils Absolute: 5.4 10*3/uL (ref 1.4–7.0)
Neutrophils: 45 %
Platelets: 301 10*3/uL (ref 150–450)
RBC: 4.63 x10E6/uL (ref 3.77–5.28)
RDW: 16.7 % — ABNORMAL HIGH (ref 11.7–15.4)
WBC: 12 10*3/uL — ABNORMAL HIGH (ref 3.4–10.8)

## 2023-06-13 LAB — CMP14+EGFR
ALT: 17 IU/L (ref 0–32)
AST: 19 IU/L (ref 0–40)
Albumin: 4.7 g/dL (ref 3.9–4.9)
Alkaline Phosphatase: 111 IU/L (ref 44–121)
BUN/Creatinine Ratio: 22 (ref 12–28)
BUN: 29 mg/dL — ABNORMAL HIGH (ref 8–27)
Bilirubin Total: 0.3 mg/dL (ref 0.0–1.2)
CO2: 21 mmol/L (ref 20–29)
Calcium: 9.9 mg/dL (ref 8.7–10.3)
Chloride: 103 mmol/L (ref 96–106)
Creatinine, Ser: 1.31 mg/dL — ABNORMAL HIGH (ref 0.57–1.00)
Globulin, Total: 2.8 g/dL (ref 1.5–4.5)
Glucose: 92 mg/dL (ref 70–99)
Potassium: 4.7 mmol/L (ref 3.5–5.2)
Sodium: 140 mmol/L (ref 134–144)
Total Protein: 7.5 g/dL (ref 6.0–8.5)
eGFR: 45 mL/min/{1.73_m2} — ABNORMAL LOW (ref >59)

## 2023-07-03 ENCOUNTER — Other Ambulatory Visit: Payer: Self-pay | Admitting: Nurse Practitioner

## 2023-07-03 DIAGNOSIS — E1165 Type 2 diabetes mellitus with hyperglycemia: Secondary | ICD-10-CM

## 2023-07-03 DIAGNOSIS — E785 Hyperlipidemia, unspecified: Secondary | ICD-10-CM

## 2023-07-06 ENCOUNTER — Telehealth: Payer: Self-pay | Admitting: Nurse Practitioner

## 2023-07-06 NOTE — Telephone Encounter (Signed)
Copied from CRM [id number]. Topic: General - Inquiry >> Jul 06, 2023  3:58 PM De Blanch wrote: Reason for CRM: Brayton Caves from [facility] Stated pt is enrolled in the Special Needs Plan for chronic conditions, so this requires verification from the PCP to stay eligible.  Brayton Caves stated they just need to verify if a member has been diagnosed with diabetes or cardiovascular disease.   REF #[id number]  Please advise.

## 2023-07-08 NOTE — Telephone Encounter (Signed)
Spoke with a representative named  Ms. Heart at [facility] and verified that patient does have diabetes and hypertension.

## 2023-07-17 ENCOUNTER — Other Ambulatory Visit: Payer: Self-pay | Admitting: Family Medicine

## 2023-07-17 DIAGNOSIS — E1165 Type 2 diabetes mellitus with hyperglycemia: Secondary | ICD-10-CM

## 2023-07-17 NOTE — Telephone Encounter (Signed)
Requested Prescriptions  Pending Prescriptions Disp Refills   metFORMIN (GLUCOPHAGE) 500 MG tablet [Pharmacy Med Name: METFORMIN HCL 500 MG TABLET] 180 tablet 0    Sig: TAKE 1 TABLET BY MOUTH TWICE A DAY WITH FOOD     Endocrinology:  Diabetes - Biguanides Failed - 07/17/2023  2:23 AM      Failed - Cr in normal range and within 360 days    Creat  Date Value Ref Range Status  08/16/2020 0.90 0.50 - 0.99 mg/dL Final    Comment:    For patients >66 years of age, the reference limit for Creatinine is approximately 13% higher for people identified as African-American. .    Creatinine, Ser  Date Value Ref Range Status  06/12/2023 1.31 (H) 0.57 - 1.00 mg/dL Final         Failed - eGFR in normal range and within 360 days    GFR, Est African American  Date Value Ref Range Status  08/16/2020 79 > OR = 60 mL/min/1.78m2 Final   GFR calc Af Amer  Date Value Ref Range Status  11/12/2020 100 >59 mL/min/1.73 Final    Comment:    **In accordance with recommendations from the NKF-ASN Task force,**   [facility] is in the process of updating its eGFR calculation to the   2021 CKD-EPI creatinine equation that estimates kidney function   without a race variable.    GFR, Est Non African American  Date Value Ref Range Status  08/16/2020 68 > OR = 60 mL/min/1.11m2 Final   GFR calc non Af Amer  Date Value Ref Range Status  11/12/2020 86 >59 mL/min/1.73 Final   eGFR  Date Value Ref Range Status  06/12/2023 45 (L) >59 mL/min/1.73 Final         Failed - B12 Level in normal range and within 720 days    Vitamin B-12  Date Value Ref Range Status  06/10/2019 1,597 (H) 180 - 914 pg/mL Final    Comment:    RESULTS CONFIRMED BY MANUAL DILUTION (NOTE) This assay is not validated for testing neonatal or myeloproliferative syndrome specimens for Vitamin B12 levels. Performed at [facility], [address], [place], [place] [address]          Passed - HBA1C is between 0 and  7.9 and within 180 days    HbA1c, POC (controlled diabetic range)  Date Value Ref Range Status  06/12/2023 6.5 0.0 - 7.0 % Final         Passed - Valid encounter within last 6 months    Recent Outpatient Visits           1 month ago Type 2 diabetes mellitus with hyperglycemia, without long-term current use of insulin [facility])   [place] [facility] [place], [place] W, NP   3 months ago Type 2 diabetes mellitus with hyperglycemia, without long-term current use of insulin [facility])   [place] [facility] [place], [place], [place]   4 months ago Type 2 diabetes mellitus with hyperglycemia, without long-term current use of insulin [facility])   [place] [facility] [place], Shea Stakes, NP   10 months ago Primary hypertension   [place] [facility] [place], Shea Stakes, NP   11 months ago Essential hypertension   [facility] [place], Shea Stakes, NP              Passed -  CBC within normal limits and completed in the last 12 months    WBC  Date Value Ref Range Status  06/12/2023 12.0 (H) 3.4 - 10.8 x10E3/uL Final  06/14/2019 17.7 (H) 4.0 - 10.5 K/uL Final   WBC Count  Date Value Ref Range Status  01/04/2020 9.4 4.0 - 10.5 K/uL Final   RBC  Date Value Ref Range Status  06/12/2023 4.63 3.77 - 5.28 x10E6/uL Final  01/04/2020 5.32 (H) 3.87 - 5.11 MIL/uL Final   Hemoglobin  Date Value Ref Range Status  06/12/2023 10.2 (L) 11.1 - 15.9 g/dL Final   Hematocrit  Date Value Ref Range Status  06/12/2023 34.0 34.0 - 46.6 % Final   MCHC  Date Value Ref Range Status  06/12/2023 30.0 (L) 31.5 - 35.7 g/dL Final  84/69/6295 28.4 30.0 - 36.0 g/dL Final   [facility]  Date Value Ref Range Status  06/12/2023 22.0 (L) 26.6 - 33.0 pg Final  01/04/2020 23.5 (L) 26.0 - 34.0 pg Final   MCV  Date Value Ref Range Status  06/12/2023 73 (L) 79 - 97 fL Final   No results found for:  "PLTCOUNTKUC", "LABPLAT", "POCPLA" RDW  Date Value Ref Range Status  06/12/2023 16.7 (H) 11.7 - 15.4 % Final

## 2023-07-24 ENCOUNTER — Other Ambulatory Visit: Payer: Self-pay | Admitting: Family Medicine

## 2023-07-24 DIAGNOSIS — E559 Vitamin D deficiency, unspecified: Secondary | ICD-10-CM

## 2023-09-08 ENCOUNTER — Ambulatory Visit
Admission: RE | Admit: 2023-09-08 | Discharge: 2023-09-08 | Disposition: A | Discharge status: Home / Self Care | Payer: Medicare HMO | Source: Ambulatory Visit | Attending: Nurse Practitioner | Admitting: Nurse Practitioner

## 2023-09-08 DIAGNOSIS — N958 Other specified menopausal and perimenopausal disorders: Secondary | ICD-10-CM | POA: Diagnosis not present

## 2023-09-08 DIAGNOSIS — E349 Endocrine disorder, unspecified: Secondary | ICD-10-CM | POA: Diagnosis not present

## 2023-09-08 DIAGNOSIS — Z13820 Encounter for screening for osteoporosis: Secondary | ICD-10-CM

## 2023-09-09 ENCOUNTER — Telehealth: Payer: Self-pay | Admitting: *Deleted

## 2023-09-09 NOTE — Telephone Encounter (Signed)
Patient returned call for results and notified: Normal bone density.  I still recommend taking 1 Citracal with vitamin D daily  Flu shot scheduled as requested

## 2023-09-10 ENCOUNTER — Ambulatory Visit: Payer: Medicare HMO | Attending: Nurse Practitioner

## 2023-09-10 ENCOUNTER — Other Ambulatory Visit: Payer: Self-pay | Admitting: Family Medicine

## 2023-09-10 DIAGNOSIS — Z23 Encounter for immunization: Secondary | ICD-10-CM

## 2023-09-10 DIAGNOSIS — I10 Essential (primary) hypertension: Secondary | ICD-10-CM

## 2023-09-10 NOTE — Progress Notes (Signed)
Flu vaccine administered in left deltoid per protocols.  Information sheet given. Patient denies and pain or discomfort at injection site. Tolerated injection well no reaction.

## 2023-09-25 ENCOUNTER — Other Ambulatory Visit: Payer: Self-pay | Admitting: Physician Assistant

## 2023-09-25 ENCOUNTER — Other Ambulatory Visit: Payer: Self-pay | Admitting: Nurse Practitioner

## 2023-09-25 DIAGNOSIS — K219 Gastro-esophageal reflux disease without esophagitis: Secondary | ICD-10-CM

## 2023-09-25 DIAGNOSIS — J329 Chronic sinusitis, unspecified: Secondary | ICD-10-CM

## 2023-09-30 ENCOUNTER — Other Ambulatory Visit: Payer: Self-pay | Admitting: Pharmacist

## 2023-09-30 NOTE — Progress Notes (Signed)
Pharmacy Quality Measure Review  This patient is appearing on a report for being at risk of failing the adherence measure for cholesterol (statin), diabetes, and hypertension (ACEi/ARB) medications this calendar year.   Medication: lisinopril Last fill date: 09/10/2023 for 90 day supply  Insurance report was not up to date. No action needed at this time.   Medication: glimepiride Last fill date: 09/29/2023 for 90 day supply  Insurance report was not up to date. No action needed at this time.   Medication: atorvastatin Last fill date: 08/10/2023 for 90 day supply  Insurance report was not up to date. No action needed at this time.   Butch Penny, PharmD, P[place]y [place], CPP Clinical Pharmacist [facility] [phone]

## 2023-10-02 ENCOUNTER — Other Ambulatory Visit: Payer: Self-pay | Admitting: Physician Assistant

## 2023-10-02 DIAGNOSIS — R058 Other specified cough: Secondary | ICD-10-CM

## 2023-10-22 ENCOUNTER — Ambulatory Visit: Payer: Self-pay

## 2023-10-22 ENCOUNTER — Other Ambulatory Visit: Payer: Self-pay | Admitting: Family Medicine

## 2023-10-22 DIAGNOSIS — E559 Vitamin D deficiency, unspecified: Secondary | ICD-10-CM

## 2023-10-22 NOTE — Telephone Encounter (Signed)
  Chief Complaint: cough  Symptoms: cough productive at times, worse at nights Frequency: 2-3 weeks  Pertinent Negatives: Patient denies SOB or fever  Disposition: [] ED /[x] Urgent Care (no appt availability in office) / [] Appointment(In office/virtual)/ []  [facility]/ [] Home Care/ [] Refused Recommended Disposition /[] [place] [facility]/ []  Follow-up with PCP Additional Notes: pt states that she has been taking prescribed cough syrup but can only take at night time. Still having issues with sleeping at nights. No appts until 11/20. Offered [facility] but pt lives in [place]. Recommended she can go to [facility] and be seen. Also recommended pt try OTC Mucinex to help with sx. Pt verbalized understanding.   Summary: cough going on for 2-3 weeks and a little dizzy last time yesterday.   Pt stated experiencing a cough going on for 2-3 weeks and a little dizzy last time yesterday. Denied SOB and wheezing.  No appointments.  Seeking clinical advice.     Reason for Disposition  [1] Continuous (nonstop) coughing interferes with work or school AND [2] no improvement using cough treatment per Care Advice  Answer Assessment - Initial Assessment Questions 1. ONSET: "When did the cough begin?"      2-3 weeks  3. SPUTUM: "Describe the color of your sputum" (none, dry cough; clear, white, yellow, green)     sometimes 5. DIFFICULTY BREATHING: "Are you having difficulty breathing?" If Yes, ask: "How bad is it?" (e.g., mild, moderate, severe)    - MILD: No SOB at rest, mild SOB with walking, speaks normally in sentences, can lie down, no retractions, pulse < 100.    - MODERATE: SOB at rest, SOB with minimal exertion and prefers to sit, cannot lie down flat, speaks in phrases, mild retractions, audible wheezing, pulse 100-120.    - SEVERE: Very SOB at rest, speaks in single words, struggling to breathe, sitting hunched forward, retractions, pulse > 120      no 6. FEVER: "Do you have a  fever?" If Yes, ask: "What is your temperature, how was it measured, and when did it start?"     No 8. LUNG HISTORY: "Do you have any history of lung disease?"  (e.g., pulmonary embolus, asthma, emphysema)     no 10. OTHER SYMPTOMS: "Do you have any other symptoms?" (e.g., runny nose, wheezing, chest pain)       Dizziness when coughing hard  Protocols used: Cough - Acute Productive-A-AH

## 2023-10-23 NOTE — Telephone Encounter (Signed)
Will forward to provider  

## 2023-10-25 NOTE — Telephone Encounter (Signed)
Should be taking pantoprazole every day and using nasal spray. If she has been doing this consistently as prescribed then schedule her an appointment with any available provider.

## 2023-10-26 NOTE — Telephone Encounter (Signed)
Patient scheduled for 10/27/2023

## 2023-10-26 NOTE — Telephone Encounter (Signed)
Unable to reach patient by phone. Voicemail left to return call. Crm created

## 2023-10-26 NOTE — Telephone Encounter (Signed)
Pt. Given message, verbalizes understanding. Asking to be worked in for her continued cough. Please advise.

## 2023-10-27 ENCOUNTER — Encounter: Payer: Self-pay | Admitting: Family Medicine

## 2023-10-27 ENCOUNTER — Ambulatory Visit: Payer: Medicare HMO | Attending: Family Medicine | Admitting: Family Medicine

## 2023-10-27 VITALS — BP 132/78 | HR 97 | Temp 98.1°F | Ht 59.0 in | Wt 167.6 lb

## 2023-10-27 DIAGNOSIS — J069 Acute upper respiratory infection, unspecified: Secondary | ICD-10-CM | POA: Diagnosis not present

## 2023-10-27 DIAGNOSIS — I10 Essential (primary) hypertension: Secondary | ICD-10-CM | POA: Diagnosis not present

## 2023-10-27 DIAGNOSIS — E1169 Type 2 diabetes mellitus with other specified complication: Secondary | ICD-10-CM

## 2023-10-27 DIAGNOSIS — Z7984 Long term (current) use of oral hypoglycemic drugs: Secondary | ICD-10-CM | POA: Diagnosis not present

## 2023-10-27 LAB — POCT GLYCOSYLATED HEMOGLOBIN (HGB A1C): HbA1c, POC (controlled diabetic range): 6.7 % (ref 0.0–7.0)

## 2023-10-27 MED ORDER — HYDROCHLOROTHIAZIDE 25 MG PO TABS: 25.0000 mg | ORAL_TABLET | Freq: Every day | ORAL | 1 refills | Status: DC

## 2023-10-27 MED ORDER — CETIRIZINE HCL 10 MG PO TABS
10.0000 mg | ORAL_TABLET | Freq: Every day | ORAL | 1 refills | Status: DC
Start: 2023-10-27 — End: 2023-11-18

## 2023-10-27 MED ORDER — BENZONATATE 100 MG PO CAPS
100.0000 mg | ORAL_CAPSULE | Freq: Two times a day (BID) | ORAL | 0 refills | Status: DC | PRN
Start: 2023-10-27 — End: 2024-08-31

## 2023-10-27 MED ORDER — METFORMIN HCL 500 MG PO TABS
500.0000 mg | ORAL_TABLET | Freq: Two times a day (BID) | ORAL | 1 refills | Status: DC
Start: 2023-10-27 — End: 2024-07-14

## 2023-10-27 NOTE — Progress Notes (Signed)
Subjective:  Patient ID: Maria Robinson, female    DOB: [birth year]  Age: 67 y.o. MRN: [id number]  CC: Cough (Runny nose)   HPI Maria Robinson is a 67 y.o. year old female patient of Tanea Moga FNP with a history of hypertension, diabetes mellitus  Interval History: Discussed the use of AI scribe software for clinical note transcription with the patient, who gave verbal consent to proceed.  The patient presents with a three and a half week history of a runny nose, burning sensation in the nose, and a cough productive of white phlegm. She also reports a sensation of something at the back of her throat, particularly upon waking. She denies chills, body aches, and joint pains, but reports feeling tired. 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    She has not seen her primary care provider since June. She has been managing her diabetes with metformin and glimepiride, and her blood pressure with amlodipine, hydrochlorothiazide, and lisinopril. She has a slightly high blood pressure reading today despite taking her medication. She has not eaten anything today.       Past Medical History:  Diagnosis Date   Allergy    Cataract    Diabetes mellitus without complication (HCC)    Hypertension    Low blood potassium    Sickle cell trait [facility])     Past Surgical History:  Procedure Laterality Date   BIOPSY  06/13/2019   Procedure: BIOPSY;  Surgeon: Lynann Bologna, MD;  Location: [facility];  Service: Endoscopy;;   CESAREAN SECTION     x 3   ESOPHAGOGASTRODUODENOSCOPY (EGD) WITH PROPOFOL N/A 06/13/2019   Procedure: ESOPHAGOGASTRODUODENOSCOPY (EGD) WITH PROPOFOL;  Surgeon: Lynann Bologna, MD;  Location: [facility];  Service: Endoscopy;  Laterality: N/A;   HAND SURGERY Right    MALONEY DILATION  06/13/2019   Procedure: MALONEY DILATION;  Surgeon: Lynann Bologna, MD;   Location: [facility];  Service: Endoscopy;;    Family History  Problem Relation Age of Onset   Diabetes Father    Colon cancer Neg Hx    Colon polyps Neg Hx    Esophageal cancer Neg Hx    Rectal cancer Neg Hx    Stomach cancer Neg Hx     Social History   Socioeconomic History   Marital status: Single    Spouse name: Not on file   Number of children: Not on file   Years of education: Not on file   Highest education level: Not on file  Occupational History   Not on file  Tobacco Use   Smoking status: Never   Smokeless tobacco: Never  Vaping Use   Vaping status: Never Used  Substance and Sexual Activity   Alcohol use: No   Drug use: No   Sexual activity: Not Currently  Other Topics Concern   Not on file  Social History Narrative   Not on file   Social Determinants of Health   Financial Resource Strain: Low Risk  (03/13/2023)   Overall Financial Resource Strain (CARDIA)    Difficulty of Paying Living Expenses: Not hard at all  Food Insecurity: No Food Insecurity (03/13/2023)   Hunger Vital Sign    Worried About Running Out of Food in the Last Year: Never true    Ran Out of Food in the Last Year: Never true  Transportation Needs: No Transportation Needs (03/13/2023)  PRAPARE - Administrator, Civil Service (Medical): No    Lack of Transportation (Non-Medical): No  Physical Activity: Inactive (03/13/2023)   Exercise Vital Sign    Days of Exercise per Week: 0 days    Minutes of Exercise per Session: 0 min  Stress: No Stress Concern Present (03/13/2023)   [employer] of Occupational Health - Occupational Stress Questionnaire    Feeling of Stress : Not at all  Social Connections: Unknown (05/05/2022)   [facility]   Social Network    Social Network: Not on file    Allergies  Allergen Reactions   Penicillins Rash and Swelling    Did it involve swelling of the face/tongue/throat, SOB, or low BP? y  Did it involve  sudden or severe rash/hives, skin peeling, or any reaction on the inside of your mouth or nose? y  Did you need to seek medical attention at a hospital or doctor's office? y  When did it last happen? 2026        If all above answers are "NO", may proceed with cephalosporin use.    Outpatient Medications Prior to Visit  Medication Sig Dispense Refill   Accu-Chek Softclix Lancets lancets Use as instructed. Check blood glucose level by fingerstick twice per day. 100 each 12   Accu-Chek Softclix Lancets lancets Use as instructed. Check blood glucose level by fingerstick twice per day. 100 each 6   amLODipine (NORVASC) 10 MG tablet TAKE 1 TABLET BY MOUTH EVERY DAY 90 tablet 1   atorvastatin (LIPITOR) 40 MG tablet TAKE 1 TABLET BY MOUTH EVERY DAY 90 tablet 1   Blood Glucose Monitoring Suppl (ACCU-CHEK GUIDE ME) w/Device KIT Use as instructed. Check blood glucose level by fingerstick twice per day. 1 kit 0   fluticasone (FLONASE) 50 MCG/ACT nasal spray SPRAY 2 SPRAYS INTO EACH NOSTRIL EVERY DAY 48 mL 0   glimepiride (AMARYL) 4 MG tablet TAKE 1 TABLET BY MOUTH 2 TIMES DAILY. 180 tablet 1   glucose blood (ACCU-CHEK GUIDE) test strip Use as instructed. Check blood glucose by fingerstick twice per day. 100 each 12   glucose blood test strip Use as instructed to check blood sugar daily. 100 each 2   ibuprofen (ADVIL) 800 MG tablet Take 1 tablet (800 mg total) by mouth every 8 (eight) hours as needed. 30 tablet 0   lisinopril (ZESTRIL) 40 MG tablet TAKE 1 TABLET BY MOUTH EVERY DAY 90 tablet 0   pantoprazole (PROTONIX) 40 MG tablet TAKE 1 TABLET BY MOUTH EVERY DAY 90 tablet 0   Vitamin D, Ergocalciferol, (DRISDOL) 1.25 MG (50000 UNIT) CAPS capsule TAKE 1 CAPSULE (50,000 UNITS TOTAL) BY MOUTH EVERY 7 (SEVEN) DAYS 12 capsule 0   hydrochlorothiazide (HYDRODIURIL) 25 MG tablet TAKE 1 TABLET (25 MG TOTAL) BY MOUTH DAILY. 90 tablet 1   metFORMIN (GLUCOPHAGE) 500 MG tablet TAKE 1 TABLET BY MOUTH TWICE A DAY WITH  FOOD 180 tablet 0   promethazine-dextromethorphan (PROMETHAZINE-DM) 6.25-15 MG/5ML syrup TAKE 5 ML BY MOUTH AT BEDTIME AS NEEDED FOR COUGH (Patient not taking: Reported on 10/27/2023) 118 mL 0   SHINGRIX injection Inject 0.5 mLs into the muscle once. (Patient not taking: Reported on 10/27/2023)     No facility-administered medications prior to visit.     ROS Review of Systems  Constitutional:  Negative for activity change and appetite change.  HENT:  Positive for rhinorrhea. Negative for sinus pressure and sore throat.   Respiratory:  Positive for  cough. Negative for chest tightness, shortness of breath and wheezing.   Cardiovascular:  Negative for chest pain and palpitations.  Gastrointestinal:  Negative for abdominal distention, abdominal pain and constipation.  Genitourinary: Negative.   Musculoskeletal: Negative.   Psychiatric/Behavioral:  Negative for behavioral problems and dysphoric mood.     Objective:  BP 132/78   Pulse 97   Temp 98.1 F (36.7 C) (Oral)   Ht 4\' 11"  (1.499 m)   Wt 167 lb 9.6 oz (76 kg)   SpO2 100%   BMI 33.85 kg/m      10/27/2023   10:27 AM 10/27/2023    9:51 AM 06/12/2023    8:43 AM  BP/Weight  Systolic BP 132 144 132  Diastolic BP 78 84 81  Wt. (Lbs)  167.6 165.2  BMI  33.85 kg/m2 33.37 kg/m2      Physical Exam Constitutional:      Appearance: She is well-developed.  HENT:     Right Ear: Tympanic membrane normal.     Left Ear: Tympanic membrane normal.     Mouth/Throat:     Mouth: Mucous membranes are moist.  Cardiovascular:     Rate and Rhythm: Normal rate.     Heart sounds: Normal heart sounds. No murmur heard. Pulmonary:     Effort: Pulmonary effort is normal.     Breath sounds: Normal breath sounds. No wheezing or rales.  Chest:     Chest wall: No tenderness.  Abdominal:     General: Bowel sounds are normal. There is no distension.     Palpations: Abdomen is soft. There is no mass.     Tenderness: There is no abdominal  tenderness.  Musculoskeletal:        General: Normal range of motion.     Right lower leg: No edema.     Left lower leg: No edema.  Neurological:     Mental Status: She is alert and oriented to person, place, and time.  Psychiatric:        Mood and Affect: Mood normal.        Latest Ref Rng & Units 06/12/2023    9:09 AM 03/09/2023   10:09 AM 08/22/2022    9:40 AM  CMP  Glucose 70 - 99 mg/dL 92  161  096   BUN 8 - 27 mg/dL 29  30  27    Creatinine 0.57 - 1.00 mg/dL 0.45  4.09  8.11   Sodium 134 - 144 mmol/L 140  137  139   Potassium 3.5 - 5.2 mmol/L 4.7  4.1  4.8   Chloride 96 - 106 mmol/L 103  99  100   CO2 20 - 29 mmol/L 21  19  21    Calcium 8.7 - 10.3 mg/dL 9.9  9.7  9.6   Total Protein 6.0 - 8.5 g/dL 7.5  7.9  7.7   Total Bilirubin 0.0 - 1.2 mg/dL 0.3  0.2  0.4   Alkaline Phos 44 - 121 IU/L 111  118  111   AST 0 - 40 IU/L 19  18  19    ALT 0 - 32 IU/L 17  19  17      Lipid Panel     Component Value Date/Time   CHOL 221 (H) 03/09/2023 1009   TRIG 225 (H) 03/09/2023 1009   HDL 53 03/09/2023 1009   CHOLHDL 4.2 03/09/2023 1009   LDLCALC 128 (H) 03/09/2023 1009    CBC    Component Value Date/Time   WBC  12.0 (H) 06/12/2023 0909   WBC 9.4 01/04/2020 0748   WBC 17.7 (H) 06/14/2019 0807   RBC 4.63 06/12/2023 0909   RBC 5.32 (H) 01/04/2020 0748   HGB 10.2 (L) 06/12/2023 0909   HCT 34.0 06/12/2023 0909   PLT 301 06/12/2023 0909   MCV 73 (L) 06/12/2023 0909   MCH 22.0 (L) 06/12/2023 0909   MCH 23.5 (L) 01/04/2020 0748   MCHC 30.0 (L) 06/12/2023 0909   MCHC 32.1 01/04/2020 0748   RDW 16.7 (H) 06/12/2023 0909   LYMPHSABS 5.6 (H) 06/12/2023 0909   MONOABS 0.9 01/04/2020 0748   EOSABS 0.2 06/12/2023 0909   BASOSABS 0.1 06/12/2023 0909    Lab Results  Component Value Date   HGBA1C 6.7 10/27/2023    Assessment & Plan:      Upper Respiratory Symptoms Complaints of runny nose, burning sensation in nose, and white phlegm for approximately 3.5 weeks. No systemic  symptoms. Noted posterior pharyngeal erythema and postnasal drainage on exam. Likely sinusitis given symptoms and exposure to school environment. -Prescribe Zyrtec to help dry up secretions. -Prescribe non-sedating cough medication.  Type 2 Diabetes Mellitus Well-controlled with recent A1C of 6.7 on Metformin and Glipizide. -Continue current regimen. -Refill Metformin. -Counseled on Diabetic diet, my plate method, 409 minutes of moderate intensity exercise/week Blood sugar logs with fasting goals of 80-120 mg/dl, random of less than 811 and in the event of sugars less than 60 mg/dl or greater than 914 mg/dl encouraged to notify the clinic. Advised on the need for annual eye exams, annual foot exams, Pneumonia vaccine.   Hypertension Slightly elevated blood pressure on current regimen of Amlodipine, Hydrochlorothiazide, and Lisinopril. -Recheck blood pressure after patient has been seated returned normal -Continue current regimen. -Counseled on blood pressure goal of less than 130/80, low-sodium, DASH diet, medication compliance, 150 minutes of moderate intensity exercise per week. Discussed medication compliance, adverse effects.   General Health Maintenance -Obtain blood work today. -Retail banker.          Meds ordered this encounter  Medications   cetirizine (ZYRTEC) 10 MG tablet    Sig: Take 1 tablet (10 mg total) by mouth daily.    Dispense:  30 tablet    Refill:  1   benzonatate (TESSALON) 100 MG capsule    Sig: Take 1 capsule (100 mg total) by mouth 2 (two) times daily as needed for cough.    Dispense:  20 capsule    Refill:  0   hydrochlorothiazide (HYDRODIURIL) 25 MG tablet    Sig: Take 1 tablet (25 mg total) by mouth daily.    Dispense:  90 tablet    Refill:  1   metFORMIN (GLUCOPHAGE) 500 MG tablet    Sig: Take 1 tablet (500 mg total) by mouth 2 (two) times daily with a meal.    Dispense:  180 tablet    Refill:  1    Follow-up: Return in  about 3 months (around 01/27/2024) for Chronic medical conditions.       Hoy Register, MD, FAAFP. [facility] [place], [place] [phone]   10/27/2023, 10:33 AM

## 2023-10-27 NOTE — Patient Instructions (Signed)
VISIT SUMMARY:  You came in today with a three and a half week history of a runny nose, burning sensation in the nose, and a cough with white phlegm. You also mentioned feeling something at the back of your throat, especially in the morning, and occasional tightness in your head and around your eyes. You have been feeling tired but have no chills, body aches, or joint pains. 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 Your blood pressure was slightly high today, and you have not eaten anything. You are currently managing your diabetes and blood pressure with medication.  YOUR PLAN:  -UPPER RESPIRATORY SYMPTOMS: You have been experiencing symptoms like a runny nose, burning sensation in the nose, and white phlegm for about 3.5 weeks, likely due to sinusitis. Sinusitis is an inflammation of the sinuses, often caused by infection or allergies. We have prescribed Zyrtec to help dry up the secretions and a non-sedating cough medication to manage your cough.  -TYPE 2 DIABETES MELLITUS: Your diabetes is well-controlled with a recent A1C of 6.7. Type 2 diabetes is a condition where your body does not use insulin properly, leading to high blood sugar levels. Continue your current regimen of Metformin and Glipizide, and we have refilled your Metformin prescription.  -HYPERTENSION: Your blood pressure was slightly elevated today. Hypertension is high blood pressure, which can lead to serious health issues if not managed. We will recheck your blood pressure after you have been seated and continue your current regimen of Amlodipine, Hydrochlorothiazide, and Lisinopril.  -GENERAL HEALTH MAINTENANCE: We will obtain blood work today to monitor your overall health and communicate the results to you via MyChart.  INSTRUCTIONS:  Please follow up with [place] if your symptoms persist or worsen. Make sure to take your medications as prescribed and monitor your blood pressure regularly. We will communicate your  blood work results through [employer].

## 2023-10-28 ENCOUNTER — Other Ambulatory Visit: Payer: Self-pay | Admitting: Family Medicine

## 2023-10-28 DIAGNOSIS — N1832 Chronic kidney disease, stage 3b: Secondary | ICD-10-CM

## 2023-10-28 LAB — CBC WITH DIFFERENTIAL/PLATELET
Basophils Absolute: 0.2 10*3/uL (ref 0.0–0.2)
Basos: 1 %
EOS (ABSOLUTE): 0.2 10*3/uL (ref 0.0–0.4)
Eos: 2 %
Hematocrit: 35.3 % (ref 34.0–46.6)
Hemoglobin: 10.2 g/dL — ABNORMAL LOW (ref 11.1–15.9)
Immature Grans (Abs): 0 10*3/uL (ref 0.0–0.1)
Immature Granulocytes: 0 %
Lymphocytes Absolute: 5.2 10*3/uL — ABNORMAL HIGH (ref 0.7–3.1)
Lymphs: 39 %
MCH: 22.3 pg — ABNORMAL LOW (ref 26.6–33.0)
MCHC: 28.9 g/dL — ABNORMAL LOW (ref 31.5–35.7)
MCV: 77 fL — ABNORMAL LOW (ref 79–97)
Monocytes Absolute: 1 10*3/uL — ABNORMAL HIGH (ref 0.1–0.9)
Monocytes: 8 %
Neutrophils Absolute: 6.6 10*3/uL (ref 1.4–7.0)
Neutrophils: 50 %
Platelets: 436 10*3/uL (ref 150–450)
RBC: 4.58 x10E6/uL (ref 3.77–5.28)
RDW: 17.4 % — ABNORMAL HIGH (ref 11.7–15.4)
WBC: 13.3 10*3/uL — ABNORMAL HIGH (ref 3.4–10.8)

## 2023-10-28 LAB — CMP14+EGFR
ALT: 20 [IU]/L (ref 0–32)
AST: 19 [IU]/L (ref 0–40)
Albumin: 4.9 g/dL (ref 3.9–4.9)
Alkaline Phosphatase: 126 [IU]/L — ABNORMAL HIGH (ref 44–121)
BUN/Creatinine Ratio: 21 (ref 12–28)
BUN: 33 mg/dL — ABNORMAL HIGH (ref 8–27)
Bilirubin Total: 0.3 mg/dL (ref 0.0–1.2)
CO2: 16 mmol/L — ABNORMAL LOW (ref 20–29)
Calcium: 9.7 mg/dL (ref 8.7–10.3)
Chloride: 106 mmol/L (ref 96–106)
Creatinine, Ser: 1.55 mg/dL — ABNORMAL HIGH (ref 0.57–1.00)
Globulin, Total: 3.1 g/dL (ref 1.5–4.5)
Glucose: 94 mg/dL (ref 70–99)
Potassium: 5.5 mmol/L — ABNORMAL HIGH (ref 3.5–5.2)
Sodium: 139 mmol/L (ref 134–144)
Total Protein: 8 g/dL (ref 6.0–8.5)
eGFR: 37 mL/min/{1.73_m2} — ABNORMAL LOW (ref >59)

## 2023-10-28 LAB — LP+NON-HDL CHOLESTEROL
Cholesterol, Total: 188 mg/dL (ref 100–199)
HDL: 50 mg/dL (ref >39)
LDL Chol Calc (NIH): 99 mg/dL (ref 0–99)
Total Non-HDL-Chol (LDL+VLDL): 138 mg/dL — ABNORMAL HIGH (ref 0–129)
Triglycerides: 228 mg/dL — ABNORMAL HIGH (ref 0–149)
VLDL Cholesterol Cal: 39 mg/dL (ref 5–40)

## 2023-10-28 MED ORDER — SODIUM POLYSTYRENE SULFONATE 15 GM/60ML PO SUSP: 30.0000 g | Freq: Once | ORAL | 0 refills | Status: DC

## 2023-11-18 ENCOUNTER — Other Ambulatory Visit: Payer: Self-pay | Admitting: Family Medicine

## 2023-11-18 DIAGNOSIS — J069 Acute upper respiratory infection, unspecified: Secondary | ICD-10-CM

## 2023-11-18 NOTE — Telephone Encounter (Signed)
Lab in date.    Requested Prescriptions  Pending Prescriptions Disp Refills   cetirizine (ZYRTEC) 10 MG tablet [Pharmacy Med Name: CETIRIZINE HCL 10 MG TABLET] 90 tablet 0    Sig: TAKE 1 TABLET BY MOUTH EVERY DAY     Ear, Nose, and Throat:  Antihistamines 2 Failed - 11/18/2023  2:30 PM      Failed - Cr in normal range and within 360 days    Creat  Date Value Ref Range Status  08/16/2020 0.90 0.50 - 0.99 mg/dL Final    Comment:    For patients >30 years of age, the reference limit for Creatinine is approximately 13% higher for people identified as African-American. .    Creatinine, Ser  Date Value Ref Range Status  10/27/2023 1.55 (H) 0.57 - 1.00 mg/dL Final         Passed - Valid encounter within last 12 months    Recent Outpatient Visits           3 weeks ago Type 2 diabetes mellitus with other specified complication, without long-term current use of insulin (HCC)   [facility] [place]. [facility] Hoy Register, MD   5 months ago Type 2 diabetes mellitus with hyperglycemia, without long-term current use of insulin (HCC)   [facility] Of [place]. [facility] [place], [place] W, NP   7 months ago Type 2 diabetes mellitus with hyperglycemia, without long-term current use of insulin (HCC)   [facility] - [facility] Of [place]. [facility], Marylene Land M, [place]   8 months ago Type 2 diabetes mellitus with hyperglycemia, without long-term current use of insulin [facility])   [facility] - A Dept Of [place]. [facility] Claiborne Rigg, NP   1 year ago Primary hypertension   [facility] - [facility] Dept Of [place]. [facility] Claiborne Rigg, [place]

## 2023-12-05 ENCOUNTER — Other Ambulatory Visit: Payer: Self-pay | Admitting: Family Medicine

## 2023-12-05 DIAGNOSIS — I10 Essential (primary) hypertension: Secondary | ICD-10-CM

## 2023-12-06 ENCOUNTER — Other Ambulatory Visit: Payer: Self-pay | Admitting: Nurse Practitioner

## 2023-12-06 DIAGNOSIS — I10 Essential (primary) hypertension: Secondary | ICD-10-CM

## 2023-12-07 ENCOUNTER — Telehealth: Payer: Self-pay | Admitting: Pharmacist

## 2023-12-07 MED ORDER — SODIUM POLYSTYRENE SULFONATE 15 GM/60ML PO SUSP: 30.0000 g | Freq: Once | ORAL | 0 refills | Status: AC

## 2023-12-07 NOTE — Telephone Encounter (Signed)
Requested medication (s) are due for refill today: yes   Requested medication (s) are on the active medication list: yes   Last refill:  09/10/23 #90 0 refills  Future visit scheduled: no   Notes to clinic:  last seen 1  month ago . No refills remain. Do you want to refill Rx?     Requested Prescriptions  Pending Prescriptions Disp Refills   lisinopril (ZESTRIL) 40 MG tablet [Pharmacy Med Name: LISINOPRIL 40 MG TABLET] 90 tablet 0    Sig: TAKE 1 TABLET BY MOUTH EVERY DAY     Cardiovascular:  ACE Inhibitors Failed - 12/07/2023  9:01 AM      Failed - Cr in normal range and within 180 days    Creat  Date Value Ref Range Status  08/16/2020 0.90 0.50 - 0.99 mg/dL Final    Comment:    For patients >19 years of age, the reference limit for Creatinine is approximately 13% higher for people identified as African-American. .    Creatinine, Ser  Date Value Ref Range Status  10/27/2023 1.55 (H) 0.57 - 1.00 mg/dL Final         Failed - K in normal range and within 180 days    Potassium  Date Value Ref Range Status  10/27/2023 5.5 (H) 3.5 - 5.2 mmol/L Final         Passed - Patient is not pregnant      Passed - Last BP in normal range    BP Readings from Last 1 Encounters:  10/27/23 132/78         Passed - Valid encounter within last 6 months    Recent Outpatient Visits           1 month ago Type 2 diabetes mellitus with other specified complication, without long-term current use of insulin (HCC)   [facility] Of [place]. [facility] Hoy Register, MD   5 months ago Type 2 diabetes mellitus with hyperglycemia, without long-term current use of insulin (HCC)   [facility] - [facility] Dept Of [place]. [facility] [place], [place] W, NP   8 months ago Type 2 diabetes mellitus with hyperglycemia, without long-term current use of insulin (HCC)   [facility] - A Dept Of [place]. [facility], Marylene Land M, [place]   9 months ago Type 2 diabetes mellitus with hyperglycemia, without long-term current use of insulin [facility])   [facility] - A Dept Of [place]. [facility] Claiborne Rigg, NP   1 year ago Primary hypertension   [facility] - [facility] Dept Of [place]. [facility] Claiborne Rigg, [place]

## 2023-12-07 NOTE — Telephone Encounter (Signed)
Hyperkalemia and decline in renal function on last lab. Will defer refill to PCP for refills.

## 2023-12-07 NOTE — Telephone Encounter (Signed)
This patient had a visit with you 10/27/2023. Labs at that visit showed hyperkalemia and increase in serum creatinine. I had a refill request come through for lisinopril today. This prompted to review labs and your comments from 10/27/2023. I contacted patient today because her fill history suggested she had not filled the Kayexalate. She confirmed that she never received this after her appt last month.I wanted to let you know I have refilled the Kayexalate and [facility]  [facility] She is going to [facility] it up and take this afternoon. I also scheduled her a follow-up with Zaineb for next months so she can have labs rechecked.

## 2023-12-09 ENCOUNTER — Telehealth: Payer: Self-pay | Admitting: Nurse Practitioner

## 2023-12-09 ENCOUNTER — Other Ambulatory Visit: Payer: Self-pay | Admitting: Pharmacist

## 2023-12-09 MED ORDER — SPS (SODIUM POLYSTYRENE SULF) 15 GM/60ML CO SUSP: 30.0000 g | Freq: Once | 0 refills | Status: AC

## 2023-12-09 NOTE — Telephone Encounter (Signed)
Patient called regarding this request and she says when she went to the pharmacy they didn't have it for her. Advised [facility]. [facility] and asked did they receive the refill of sodium polystyrene on 12/07/23. He says it was not received and sometimes they don't receive electronic, so if we could send it again he will have it ready for pickup today.

## 2023-12-09 NOTE — Telephone Encounter (Signed)
Medication Refill -  Most Recent Primary Care Visit:  Provider: Hoy Register  Department: [facility]  Visit Type: OFFICE VISIT  Date: 10/27/2023  Medication: sodium polystyrene (KAYEXALATE) 15 GM/60ML suspension [27999]   Has the patient contacted their pharmacy? Yes Is this the correct pharmacy for this prescription? Yes If no, delete pharmacy and type the correct one.  This is the patient's preferred pharmacy:   [id number] [place], [place] - [address] [place] [address] Phone: [phone] Fax: [phone]   Has the prescription been filled recently? No  Is the patient out of the medication? Yes  Has the patient been seen for an appointment in the last year OR does the patient have an upcoming appointment? Yes  Can we respond through MyChart? Yes  Agent: Please be advised that Rx refills may take up to 3 business days. We ask that you follow-up with your pharmacy.

## 2023-12-09 NOTE — Telephone Encounter (Signed)
Medication resent by Lois Huxley, Cornelius Moras, RPH-CPP

## 2023-12-26 ENCOUNTER — Other Ambulatory Visit: Payer: Self-pay | Admitting: Family Medicine

## 2023-12-26 DIAGNOSIS — K219 Gastro-esophageal reflux disease without esophagitis: Secondary | ICD-10-CM

## 2023-12-27 ENCOUNTER — Other Ambulatory Visit: Payer: Self-pay | Admitting: Family Medicine

## 2023-12-27 DIAGNOSIS — B9689 Other specified bacterial agents as the cause of diseases classified elsewhere: Secondary | ICD-10-CM

## 2023-12-31 ENCOUNTER — Other Ambulatory Visit: Payer: Self-pay | Admitting: Family Medicine

## 2023-12-31 ENCOUNTER — Other Ambulatory Visit: Payer: Self-pay | Admitting: Nurse Practitioner

## 2023-12-31 DIAGNOSIS — I10 Essential (primary) hypertension: Secondary | ICD-10-CM

## 2024-01-01 ENCOUNTER — Other Ambulatory Visit: Payer: Self-pay | Admitting: Nurse Practitioner

## 2024-01-01 DIAGNOSIS — I10 Essential (primary) hypertension: Secondary | ICD-10-CM

## 2024-01-01 MED ORDER — LISINOPRIL 40 MG PO TABS
40.0000 mg | ORAL_TABLET | Freq: Every day | ORAL | 0 refills | Status: DC
Start: 2024-01-01 — End: 2024-01-15

## 2024-01-02 ENCOUNTER — Other Ambulatory Visit: Payer: Self-pay | Admitting: Family Medicine

## 2024-01-02 DIAGNOSIS — I10 Essential (primary) hypertension: Secondary | ICD-10-CM

## 2024-01-13 ENCOUNTER — Other Ambulatory Visit: Payer: Self-pay | Admitting: Family Medicine

## 2024-01-13 DIAGNOSIS — E559 Vitamin D deficiency, unspecified: Secondary | ICD-10-CM

## 2024-01-15 ENCOUNTER — Ambulatory Visit: Payer: Medicare HMO | Attending: Nurse Practitioner | Admitting: Nurse Practitioner

## 2024-01-15 ENCOUNTER — Encounter: Payer: Self-pay | Admitting: Nurse Practitioner

## 2024-01-15 VITALS — BP 122/74 | HR 95 | Resp 20 | Ht 59.0 in | Wt 169.2 lb

## 2024-01-15 DIAGNOSIS — E119 Type 2 diabetes mellitus without complications: Secondary | ICD-10-CM

## 2024-01-15 DIAGNOSIS — I10 Essential (primary) hypertension: Secondary | ICD-10-CM | POA: Diagnosis not present

## 2024-01-15 DIAGNOSIS — J309 Allergic rhinitis, unspecified: Secondary | ICD-10-CM | POA: Diagnosis not present

## 2024-01-15 DIAGNOSIS — D509 Iron deficiency anemia, unspecified: Secondary | ICD-10-CM

## 2024-01-15 DIAGNOSIS — K219 Gastro-esophageal reflux disease without esophagitis: Secondary | ICD-10-CM | POA: Diagnosis not present

## 2024-01-15 DIAGNOSIS — Z7984 Long term (current) use of oral hypoglycemic drugs: Secondary | ICD-10-CM | POA: Diagnosis not present

## 2024-01-15 MED ORDER — CETIRIZINE HCL 10 MG PO TABS: 10.0000 mg | ORAL_TABLET | Freq: Every day | ORAL | 1 refills | Status: DC

## 2024-01-15 MED ORDER — PANTOPRAZOLE SODIUM 40 MG PO TBEC
40.0000 mg | DELAYED_RELEASE_TABLET | Freq: Every day | ORAL | 1 refills | Status: DC
Start: 2024-01-15 — End: 2024-09-19

## 2024-01-15 MED ORDER — FLUTICASONE PROPIONATE 50 MCG/ACT NA SUSP
2.0000 | Freq: Every day | NASAL | 0 refills | Status: DC
Start: 2024-01-15 — End: 2024-08-31

## 2024-01-15 MED ORDER — ACCU-CHEK SOFTCLIX LANCETS MISC
6 refills | Status: AC
Start: 2024-01-15 — End: ?

## 2024-01-15 MED ORDER — GLUCOSE BLOOD VI STRP
ORAL_STRIP | 2 refills | Status: AC
Start: 2024-01-15 — End: ?

## 2024-01-15 MED ORDER — AMLODIPINE BESYLATE 10 MG PO TABS
10.0000 mg | ORAL_TABLET | Freq: Every day | ORAL | 1 refills | Status: DC
Start: 2024-01-15 — End: 2024-08-02

## 2024-01-15 MED ORDER — VALSARTAN 80 MG PO TABS
80.0000 mg | ORAL_TABLET | Freq: Every day | ORAL | 3 refills | Status: AC
Start: 2024-01-15 — End: ?

## 2024-01-15 NOTE — Progress Notes (Signed)
Assessment & Plan:  [name] was seen today for medical management of chronic issues.  Diagnoses and all orders for this visit:  Essential hypertension Stop lisinopril due to chronic cough.  Start losartan and follow-up in a few weeks for blood pressure check and repeat electrolyte panel -     amLODipine (NORVASC) 10 MG tablet; Take 1 tablet (10 mg total) by mouth daily. -     valsartan (DIOVAN) 80 MG tablet; Take 1 tablet (80 mg total) by mouth daily. -     CMP14+EGFR  Allergic rhinitis, unspecified seasonality, unspecified trigger -     fluticasone (FLONASE) 50 MCG/ACT nasal spray; Place 2 sprays into both nostrils daily. -     cetirizine (ZYRTEC) 10 MG tablet; Take 1 tablet (10 mg total) by mouth daily.  Diabetes mellitus treated with oral medication (HCC) -     Accu-Chek Softclix Lancets lancets; Use as instructed. Check blood glucose level by fingerstick twice per day. -     glucose blood test strip; Use as instructed to check blood sugar daily.  GERD without esophagitis -     pantoprazole (PROTONIX) 40 MG tablet; Take 1 tablet (40 mg total) by mouth daily. INSTRUCTIONS: Avoid GERD Triggers: acidic, spicy or fried foods, caffeine, coffee, sodas,  alcohol and chocolate.    Microcytic anemia -     CBC with Differential    Patient has been counseled on age-appropriate routine health concerns for screening and prevention. These are reviewed and up-to-date. Referrals have been placed accordingly. Immunizations are up-to-date or declined.    Subjective:   Chief Complaint  Patient presents with   Medical Management of Chronic Issues    Maria Robinson [place] 68 y.o. female presents to office today for follow up to HTN and hypokalemia  She has been experiencing a chronic intermittent cough over the past several months.  Taking antihistamine and steroid nasal spray daily as prescribed.  At this time we will discontinue lisinopril and start her on valsartan.  She was also recently treated  with Kayexalate for hyperkalemia.  We will recheck potassium today.  Blood pressure is well-controlled. BP Readings from Last 3 Encounters:  01/15/24 122/74  10/27/23 132/78  06/12/23 132/81     Review of Systems  Constitutional:  Negative for fever, malaise/fatigue and weight loss.  HENT: Negative.  Negative for nosebleeds.   Eyes: Negative.  Negative for blurred vision, double vision and photophobia.  Respiratory:  Positive for cough. Negative for shortness of breath.   Cardiovascular: Negative.  Negative for chest pain, palpitations and leg swelling.  Gastrointestinal: Negative.  Negative for heartburn, nausea and vomiting.  Musculoskeletal: Negative.  Negative for myalgias.  Neurological: Negative.  Negative for dizziness, focal weakness, seizures and headaches.  Psychiatric/Behavioral: Negative.  Negative for suicidal ideas.     Past Medical History:  Diagnosis Date   Allergy    Cataract    Diabetes mellitus without complication (HCC)    Hypertension    Low blood potassium    Sickle cell trait [facility])     Past Surgical History:  Procedure Laterality Date   BIOPSY  06/13/2019   Procedure: BIOPSY;  Surgeon: Lynann Bologna, MD;  [facility]: [facility];  Service: Endoscopy;;   CESAREAN SECTION     x 3   ESOPHAGOGASTRODUODENOSCOPY (EGD) WITH PROPOFOL N/A 06/13/2019   Procedure: ESOPHAGOGASTRODUODENOSCOPY (EGD) WITH PROPOFOL;  Surgeon: Lynann Bologna, MD;  [facility];  Service: Endoscopy;  Laterality: N/A;   HAND SURGERY Right    MALONEY DILATION  06/13/2019   Procedure: Elease Hashimoto DILATION;  Surgeon: Lynann Bologna, MD;  [facility];  Service: Endoscopy;;    Family History  Problem Relation Age of Onset   Diabetes Father    Colon cancer Neg Hx    Colon polyps Neg Hx    Esophageal cancer Neg Hx    Rectal cancer Neg Hx    Stomach cancer Neg Hx     Social History Reviewed with no changes to be made today.   Outpatient Medications Prior to Visit   Medication Sig Dispense Refill   Accu-Chek Softclix Lancets lancets Use as instructed. Check blood glucose level by fingerstick twice per day. 100 each 12   atorvastatin (LIPITOR) 40 MG tablet TAKE 1 TABLET BY MOUTH EVERY DAY 90 tablet 1   Blood Glucose Monitoring Suppl (ACCU-CHEK GUIDE ME) w/Device KIT Use as instructed. Check blood glucose level by fingerstick twice per day. 1 kit 0   glimepiride (AMARYL) 4 MG tablet TAKE 1 TABLET BY MOUTH 2 TIMES DAILY. 180 tablet 1   glucose blood (ACCU-CHEK GUIDE) test strip Use as instructed. Check blood glucose by fingerstick twice per day. 100 each 12   hydrochlorothiazide (HYDRODIURIL) 25 MG tablet Take 1 tablet (25 mg total) by mouth daily. 90 tablet 1   ibuprofen (ADVIL) 800 MG tablet Take 1 tablet (800 mg total) by mouth every 8 (eight) hours as needed. 30 tablet 0   metFORMIN (GLUCOPHAGE) 500 MG tablet Take 1 tablet (500 mg total) by mouth 2 (two) times daily with a meal. 180 tablet 1   promethazine-dextromethorphan (PROMETHAZINE-DM) 6.25-15 MG/5ML syrup TAKE 5 ML BY MOUTH AT BEDTIME AS NEEDED FOR COUGH 118 mL 0   SHINGRIX injection Inject 0.5 mLs into the muscle once.     Vitamin D, Ergocalciferol, (DRISDOL) 1.25 MG (50000 UNIT) CAPS capsule TAKE 1 CAPSULE (50,000 UNITS TOTAL) BY MOUTH EVERY 7 (SEVEN) DAYS 12 capsule 0   Accu-Chek Softclix Lancets lancets Use as instructed. Check blood glucose level by fingerstick twice per day. 100 each 6   amLODipine (NORVASC) 10 MG tablet TAKE 1 TABLET BY MOUTH EVERY DAY 30 tablet 0   fluticasone (FLONASE) 50 MCG/ACT nasal spray SPRAY 2 SPRAYS INTO EACH NOSTRIL EVERY DAY 48 mL 0   glucose blood test strip Use as instructed to check blood sugar daily. 100 each 2   lisinopril (ZESTRIL) 40 MG tablet Take 1 tablet (40 mg total) by mouth daily. 30 tablet 0   pantoprazole (PROTONIX) 40 MG tablet TAKE 1 TABLET BY MOUTH EVERY DAY 90 tablet 0   benzonatate (TESSALON) 100 MG capsule Take 1 capsule (100 mg total) by mouth 2  (two) times daily as needed for cough. (Patient not taking: Reported on 01/15/2024) 20 capsule 0   cetirizine (ZYRTEC) 10 MG tablet TAKE 1 TABLET BY MOUTH EVERY DAY (Patient not taking: Reported on 01/15/2024) 90 tablet 0   No facility-administered medications prior to visit.    Allergies  Allergen Reactions   Penicillins Rash and Swelling    Did it involve swelling of the face/tongue/throat, SOB, or low BP? y  Did it involve sudden or severe rash/hives, skin peeling, or any reaction on the inside of your mouth or nose? y  Did you need to seek medical attention at a hospital or doctor's office? y  When did it last happen? 2026        If all above answers are "NO", may proceed with cephalosporin use.       Objective:  BP 122/74 (BP Location: Left Arm, Patient Position: Sitting, Cuff Size: Normal)   Pulse 95   Resp 20   Ht 4\' 11"  (1.499 m)   Wt 169 lb 3.2 oz (76.7 kg)   SpO2 100%   BMI 34.17 kg/m  Wt Readings from Last 3 Encounters:  01/15/24 169 lb 3.2 oz (76.7 kg)  10/27/23 167 lb 9.6 oz (76 kg)  06/12/23 165 lb 3.2 oz (74.9 kg)    Physical Exam Vitals and nursing note reviewed.  Constitutional:      Appearance: She is well-developed.  HENT:     Head: Normocephalic and atraumatic.  Cardiovascular:     Rate and Rhythm: Normal rate and regular rhythm.     Heart sounds: Normal heart sounds. No murmur heard.    No friction rub. No gallop.  Pulmonary:     Effort: Pulmonary effort is normal. No tachypnea or respiratory distress.     Breath sounds: Normal breath sounds. No decreased breath sounds, wheezing, rhonchi or rales.  Chest:     Chest wall: No tenderness.  Abdominal:     General: Bowel sounds are normal.     Palpations: Abdomen is soft.  Musculoskeletal:        General: Normal range of motion.     Cervical back: Normal range of motion.  Skin:    General: Skin is warm and dry.  Neurological:     Mental Status: She is alert and oriented to person, place,  and time.     Coordination: Coordination normal.  Psychiatric:        Behavior: Behavior normal. Behavior is cooperative.        Thought Content: Thought content normal.        Judgment: Judgment normal.          Patient has been counseled extensively about nutrition and exercise as well as the importance of adherence with medications and regular follow-up. The patient was given clear instructions to go to ER or return to medical center if symptoms don't improve, worsen or new problems develop. The patient verbalized understanding.   Follow-up: Return for labs in 2-3 weeks. See me in 2 months for BP Check.   Claiborne Rigg, FNP-BC [facility] [place], [place] [phone]   01/15/2024, 10:17 AM

## 2024-01-16 LAB — CMP14+EGFR
ALT: 19 [IU]/L (ref 0–32)
AST: 17 [IU]/L (ref 0–40)
Albumin: 4.8 g/dL (ref 3.9–4.9)
Alkaline Phosphatase: 124 [IU]/L — ABNORMAL HIGH (ref 44–121)
BUN/Creatinine Ratio: 22 (ref 12–28)
BUN: 31 mg/dL — ABNORMAL HIGH (ref 8–27)
Bilirubin Total: 0.3 mg/dL (ref 0.0–1.2)
CO2: 19 mmol/L — ABNORMAL LOW (ref 20–29)
Calcium: 9.6 mg/dL (ref 8.7–10.3)
Chloride: 101 mmol/L (ref 96–106)
Creatinine, Ser: 1.41 mg/dL — ABNORMAL HIGH (ref 0.57–1.00)
Globulin, Total: 3.3 g/dL (ref 1.5–4.5)
Glucose: 89 mg/dL (ref 70–99)
Potassium: 4.6 mmol/L (ref 3.5–5.2)
Sodium: 140 mmol/L (ref 134–144)
Total Protein: 8.1 g/dL (ref 6.0–8.5)
eGFR: 41 mL/min/{1.73_m2} — ABNORMAL LOW (ref >59)

## 2024-01-16 LAB — CBC WITH DIFFERENTIAL/PLATELET
Basophils Absolute: 0.2 10*3/uL (ref 0.0–0.2)
Basos: 1 %
EOS (ABSOLUTE): 0.2 10*3/uL (ref 0.0–0.4)
Eos: 1 %
Hematocrit: 32.1 % — ABNORMAL LOW (ref 34.0–46.6)
Hemoglobin: 9.5 g/dL — ABNORMAL LOW (ref 11.1–15.9)
Immature Grans (Abs): 0 10*3/uL (ref 0.0–0.1)
Immature Granulocytes: 0 %
Lymphocytes Absolute: 5.8 10*3/uL — ABNORMAL HIGH (ref 0.7–3.1)
Lymphs: 46 %
MCH: 21.7 pg — ABNORMAL LOW (ref 26.6–33.0)
MCHC: 29.6 g/dL — ABNORMAL LOW (ref 31.5–35.7)
MCV: 73 fL — ABNORMAL LOW (ref 79–97)
Monocytes Absolute: 1 10*3/uL — ABNORMAL HIGH (ref 0.1–0.9)
Monocytes: 7 %
Neutrophils Absolute: 5.9 10*3/uL (ref 1.4–7.0)
Neutrophils: 45 %
Platelets: 445 10*3/uL (ref 150–450)
RBC: 4.38 x10E6/uL (ref 3.77–5.28)
RDW: 15.8 % — ABNORMAL HIGH (ref 11.7–15.4)
WBC: 13.1 10*3/uL — ABNORMAL HIGH (ref 3.4–10.8)

## 2024-01-27 ENCOUNTER — Telehealth: Payer: Self-pay

## 2024-01-27 NOTE — Telephone Encounter (Signed)
 Copied from CRM [id number]. Topic: Clinical - Lab/Test Results >> Jan 26, 2024  2:19 PM Lizabeth Riggs wrote: Reason for CRM: Maria Robinson want a nurse to call her about her lab results. She has questions. Please call [phone]. Thanks

## 2024-01-27 NOTE — Telephone Encounter (Signed)
 Call placed to patient unable to reach message left on VM.  Call to discuss labs and patient questions

## 2024-01-30 ENCOUNTER — Other Ambulatory Visit: Payer: Self-pay | Admitting: Family Medicine

## 2024-01-30 DIAGNOSIS — E1165 Type 2 diabetes mellitus with hyperglycemia: Secondary | ICD-10-CM

## 2024-02-01 NOTE — Telephone Encounter (Signed)
 Requested Prescriptions  Pending Prescriptions Disp Refills   glimepiride  (AMARYL ) 4 MG tablet [Pharmacy Med Name: GLIMEPIRIDE  4 MG TABLET] 180 tablet 0    Sig: TAKE 1 TABLET BY MOUTH TWICE A DAY     Endocrinology:  Diabetes - Sulfonylureas Failed - 02/01/2024 12:44 PM      Failed - Cr in normal range and within 360 days    Creat  Date Value Ref Range Status  08/16/2020 0.90 0.50 - 0.99 mg/dL Final    Comment:    For patients >67 years of age, the reference limit for Creatinine is approximately 13% higher for people identified as African-American. .    Creatinine, Ser  Date Value Ref Range Status  01/15/2024 1.41 (H) 0.57 - 1.00 mg/dL Final         Passed - HBA1C is between 0 and 7.9 and within 180 days    HbA1c, POC (controlled diabetic range)  Date Value Ref Range Status  10/27/2023 6.7 0.0 - 7.0 % Final         Passed - Valid encounter within last 6 months    Recent Outpatient Visits           2 weeks ago Essential hypertension   [facility] - [facility] Dept Of [place]. [facility] [place], [place] W, NP   3 months ago Type 2 diabetes mellitus with other specified complication, without long-term current use of insulin  [facility])   [facility] - A Dept Of [place]. [facility] Joaquin Mulberry, MD   7 months ago Type 2 diabetes mellitus with hyperglycemia, without long-term current use of insulin  [facility])   [facility] - A Dept Of [place]. [facility] [place], [place] W, NP   10 months ago Type 2 diabetes mellitus with hyperglycemia, without long-term current use of insulin  [facility])   [place] - A Dept Of [place]. [facility], Shelvy Dickens M, [place]   10 months ago Type 2 diabetes mellitus with hyperglycemia, without long-term current use of insulin  [facility])   [facility] - A Dept Of [place]. [facility] [place], [place]

## 2024-02-07 ENCOUNTER — Other Ambulatory Visit: Payer: Self-pay | Admitting: Family Medicine

## 2024-02-07 DIAGNOSIS — I10 Essential (primary) hypertension: Secondary | ICD-10-CM

## 2024-02-08 ENCOUNTER — Other Ambulatory Visit: Payer: Self-pay | Admitting: Family Medicine

## 2024-02-08 DIAGNOSIS — E785 Hyperlipidemia, unspecified: Secondary | ICD-10-CM

## 2024-02-19 DIAGNOSIS — E669 Obesity, unspecified: Secondary | ICD-10-CM | POA: Diagnosis not present

## 2024-02-19 DIAGNOSIS — N1832 Chronic kidney disease, stage 3b: Secondary | ICD-10-CM | POA: Diagnosis not present

## 2024-02-19 DIAGNOSIS — I129 Hypertensive chronic kidney disease with stage 1 through stage 4 chronic kidney disease, or unspecified chronic kidney disease: Secondary | ICD-10-CM | POA: Diagnosis not present

## 2024-02-19 DIAGNOSIS — N189 Chronic kidney disease, unspecified: Secondary | ICD-10-CM | POA: Diagnosis not present

## 2024-02-19 DIAGNOSIS — D631 Anemia in chronic kidney disease: Secondary | ICD-10-CM | POA: Diagnosis not present

## 2024-02-19 DIAGNOSIS — E1122 Type 2 diabetes mellitus with diabetic chronic kidney disease: Secondary | ICD-10-CM | POA: Diagnosis not present

## 2024-02-19 DIAGNOSIS — R82998 Other abnormal findings in urine: Secondary | ICD-10-CM | POA: Diagnosis not present

## 2024-02-22 ENCOUNTER — Other Ambulatory Visit: Payer: Self-pay | Admitting: Nephrology

## 2024-02-22 DIAGNOSIS — N1832 Chronic kidney disease, stage 3b: Secondary | ICD-10-CM

## 2024-02-22 DIAGNOSIS — I129 Hypertensive chronic kidney disease with stage 1 through stage 4 chronic kidney disease, or unspecified chronic kidney disease: Secondary | ICD-10-CM

## 2024-02-22 DIAGNOSIS — N189 Chronic kidney disease, unspecified: Secondary | ICD-10-CM

## 2024-02-23 ENCOUNTER — Ambulatory Visit: Payer: Medicare HMO

## 2024-02-24 ENCOUNTER — Ambulatory Visit
Admission: RE | Admit: 2024-02-24 | Discharge: 2024-02-24 | Discharge status: Home / Self Care | Source: Ambulatory Visit | Attending: Nephrology

## 2024-02-24 DIAGNOSIS — D631 Anemia in chronic kidney disease: Secondary | ICD-10-CM

## 2024-02-24 DIAGNOSIS — N1832 Chronic kidney disease, stage 3b: Secondary | ICD-10-CM

## 2024-02-24 DIAGNOSIS — N189 Chronic kidney disease, unspecified: Secondary | ICD-10-CM | POA: Diagnosis not present

## 2024-02-24 DIAGNOSIS — I129 Hypertensive chronic kidney disease with stage 1 through stage 4 chronic kidney disease, or unspecified chronic kidney disease: Secondary | ICD-10-CM

## 2024-04-20 ENCOUNTER — Other Ambulatory Visit: Payer: Self-pay | Admitting: Nurse Practitioner

## 2024-04-20 DIAGNOSIS — J309 Allergic rhinitis, unspecified: Secondary | ICD-10-CM

## 2024-04-20 NOTE — Telephone Encounter (Signed)
 Copied from CRM [id number]. Topic: Clinical - Medication Refill >> Apr 20, 2024 10:05 AM Baldomero Bone wrote: Most Recent Primary Care Visit:  Provider: Winda Hastings W  Department[facility]  Visit Type: OFFICE VISIT  Date: 01/15/2024  Medication: cetirizine  (ZYRTEC ) 10 MG tablet  Has the patient contacted their pharmacy? No (Agent: If no, request that the patient contact the pharmacy for the refill. If patient does not wish to contact the pharmacy document the reason why and proceed with request.) (Agent: If yes, when and what did the pharmacy advise?)  Is this the correct pharmacy for this prescription? Yes If no, delete pharmacy and type the correct one.  This is the patient's preferred pharmacy:  [id number] [place], [place] - [address] [place] [address] Phone: [phone] Fax: [phone]  Has the prescription been filled recently? No  Is the patient out of the medication? No  Has the patient been seen for an appointment in the last year OR does the patient have an upcoming appointment? Yes  Can we respond through MyChart? No  Agent: Please be advised that Rx refills may take up to 3 business days. We ask that you follow-up with your pharmacy.

## 2024-04-23 NOTE — Telephone Encounter (Signed)
 Requested too soon, last ordered 01/15/24 #90, 1 refill Requested Prescriptions  Pending Prescriptions Disp Refills   cetirizine  (ZYRTEC ) 10 MG tablet 90 tablet 1    Sig: Take 1 tablet (10 mg total) by mouth daily.     Ear, Nose, and Throat:  Antihistamines 2 Failed - 04/23/2024  3:02 AM      Failed - Cr in normal range and within 360 days    Creat  Date Value Ref Range Status  08/16/2020 0.90 0.50 - 0.99 mg/dL Final    Comment:    For patients >93 years of age, the reference limit for Creatinine is approximately 13% higher for people identified as African-American. .    Creatinine, Ser  Date Value Ref Range Status  01/15/2024 1.41 (H) 0.57 - 1.00 mg/dL Final         Passed - Valid encounter within last 12 months    Recent Outpatient Visits           3 months ago Essential hypertension   [facility] - [facility] Dept Of [place]. [facility] [place], [place] W, NP   5 months ago Type 2 diabetes mellitus with other specified complication, without long-term current use of insulin  [facility])   [facility] - A Dept Of [place]. [facility] Joaquin Mulberry, MD   10 months ago Type 2 diabetes mellitus with hyperglycemia, without long-term current use of insulin  [facility])   [facility] - A Dept Of [place]. [facility] [place], [place] W, NP   1 year ago Type 2 diabetes mellitus with hyperglycemia, without long-term current use of insulin  [facility])   [place] [facility] [facility] - A Dept Of [place]. [facility] [place], [place], [place]   1 year ago Type 2 diabetes mellitus with hyperglycemia, without long-term current use of insulin  [facility])   [facility] - A Dept Of [place]. [facility] [place], [place]

## 2024-04-24 ENCOUNTER — Other Ambulatory Visit: Payer: Self-pay | Admitting: Nurse Practitioner

## 2024-04-24 DIAGNOSIS — J309 Allergic rhinitis, unspecified: Secondary | ICD-10-CM

## 2024-04-24 MED ORDER — CETIRIZINE HCL 10 MG PO TABS: 10.0000 mg | ORAL_TABLET | Freq: Every day | ORAL | 1 refills | Status: AC

## 2024-06-13 ENCOUNTER — Other Ambulatory Visit: Payer: Self-pay | Admitting: Nurse Practitioner

## 2024-06-13 DIAGNOSIS — E1165 Type 2 diabetes mellitus with hyperglycemia: Secondary | ICD-10-CM

## 2024-06-17 ENCOUNTER — Other Ambulatory Visit: Payer: Self-pay | Admitting: Nurse Practitioner

## 2024-06-17 DIAGNOSIS — Z1231 Encounter for screening mammogram for malignant neoplasm of breast: Secondary | ICD-10-CM

## 2024-07-12 ENCOUNTER — Ambulatory Visit
Admission: RE | Admit: 2024-07-12 | Discharge: 2024-07-12 | Disposition: A | Discharge status: Home / Self Care | Source: Ambulatory Visit | Attending: Nurse Practitioner | Admitting: Nurse Practitioner

## 2024-07-12 DIAGNOSIS — Z1231 Encounter for screening mammogram for malignant neoplasm of breast: Secondary | ICD-10-CM | POA: Diagnosis not present

## 2024-07-14 ENCOUNTER — Other Ambulatory Visit: Payer: Self-pay | Admitting: Family Medicine

## 2024-07-14 DIAGNOSIS — E1169 Type 2 diabetes mellitus with other specified complication: Secondary | ICD-10-CM

## 2024-07-17 ENCOUNTER — Other Ambulatory Visit: Payer: Self-pay | Admitting: Family Medicine

## 2024-07-17 DIAGNOSIS — E1165 Type 2 diabetes mellitus with hyperglycemia: Secondary | ICD-10-CM

## 2024-07-18 ENCOUNTER — Ambulatory Visit: Payer: Self-pay | Admitting: Nurse Practitioner

## 2024-08-02 ENCOUNTER — Other Ambulatory Visit: Payer: Self-pay | Admitting: Nurse Practitioner

## 2024-08-02 DIAGNOSIS — E785 Hyperlipidemia, unspecified: Secondary | ICD-10-CM

## 2024-08-02 DIAGNOSIS — I10 Essential (primary) hypertension: Secondary | ICD-10-CM

## 2024-08-04 ENCOUNTER — Other Ambulatory Visit: Payer: Self-pay | Admitting: Nurse Practitioner

## 2024-08-04 DIAGNOSIS — E1165 Type 2 diabetes mellitus with hyperglycemia: Secondary | ICD-10-CM

## 2024-08-19 ENCOUNTER — Other Ambulatory Visit: Payer: Self-pay | Admitting: Family Medicine

## 2024-08-19 DIAGNOSIS — E1169 Type 2 diabetes mellitus with other specified complication: Secondary | ICD-10-CM

## 2024-08-26 ENCOUNTER — Other Ambulatory Visit: Payer: Self-pay | Admitting: Family Medicine

## 2024-08-26 DIAGNOSIS — E785 Hyperlipidemia, unspecified: Secondary | ICD-10-CM

## 2024-08-26 DIAGNOSIS — I10 Essential (primary) hypertension: Secondary | ICD-10-CM

## 2024-08-26 NOTE — Telephone Encounter (Signed)
 Requested medications are due for refill today.  Refilled 08/02/2024 30 day supply for each  Requested medications are on the active medications list.  yes  Last refill. 08/02/2024  Future visit scheduled.   Yes 08/31/2024  Notes to clinic.  Pt should have 2 days of medication left prior to upcoming appt.    Requested Prescriptions  Pending Prescriptions Disp Refills   atorvastatin  (LIPITOR) 40 MG tablet [Pharmacy Med Name: ATORVASTATIN  40 MG TABLET] 90 tablet 1    Sig: TAKE 1 TABLET BY MOUTH EVERY DAY     Cardiovascular:  Antilipid - Statins Failed - 08/26/2024  4:29 PM      Failed - Lipid Panel in normal range within the last 12 months    Cholesterol, Total  Date Value Ref Range Status  10/27/2023 188 100 - 199 mg/dL Final   LDL Chol Calc (NIH)  Date Value Ref Range Status  10/27/2023 99 0 - 99 mg/dL Final   HDL  Date Value Ref Range Status  10/27/2023 50 >39 mg/dL Final   Triglycerides  Date Value Ref Range Status  10/27/2023 228 (H) 0 - 149 mg/dL Final         Passed - Patient is not pregnant      Passed - Valid encounter within last 12 months    Recent Outpatient Visits           7 months ago Essential hypertension   [facility] [place] - A Dept Of [place]. [facility] [place], [place] W, NP   10 months ago Type 2 diabetes mellitus with other specified complication, without long-term current use of insulin  [facility])   [facility] - A Dept Of [place]. [facility] Delbert Clam, MD   1 year ago Type 2 diabetes mellitus with hyperglycemia, without long-term current use of insulin  [facility])   [place] - A Dept Of [place]. [facility] [place], [place] W, NP   1 year ago Type 2 diabetes mellitus with hyperglycemia, without long-term current use of insulin  [facility])   [facility] [facility] - A Dept Of [place]. [facility] [place], [place], [place]   1 year ago Type 2 diabetes  mellitus with hyperglycemia, without long-term current use of insulin  [facility])   [facility] - A Dept Of [place]. [facility] [place], [place] W, NP               amLODipine  (NORVASC ) 10 MG tablet [Pharmacy Med Name: AMLODIPINE  BESYLATE 10 MG TAB] 90 tablet 1    Sig: TAKE 1 TABLET BY MOUTH EVERY DAY     Cardiovascular: Calcium  Channel Blockers 2 Failed - 08/26/2024  4:29 PM      Failed - Valid encounter within last 6 months    Recent Outpatient Visits           7 months ago Essential hypertension   [facility] [place] - A Dept Of [place]. [facility] [place], [place] W, NP   10 months ago Type 2 diabetes mellitus with other specified complication, without long-term current use of insulin  [facility])   [place] - A Dept Of [place]. [facility] Delbert Clam, MD   1 year ago Type 2 diabetes mellitus with hyperglycemia, without long-term current use of insulin  [facility])   [facility] - A Dept Of [place]. [facility] [place], [place] W, NP   1 year ago Type 2 diabetes mellitus with hyperglycemia, without long-term current use of insulin  [facility])   [facility] [facility] - A Dept Of [place]. [facility] [place], [place], [place]   1 year ago Type 2 diabetes mellitus with hyperglycemia, without long-term current use of insulin  [facility])   [facility] - A Dept Of [place]. [facility] [place], [place] W, NP              Passed - Last BP in normal range    BP Readings from Last 1 Encounters:  01/15/24 122/74         Passed - Last Heart Rate in normal range    Pulse Readings from Last 1 Encounters:  01/15/24 95          hydrochlorothiazide  (HYDRODIURIL ) 25 MG tablet [Pharmacy Med Name: HYDROCHLOROTHIAZIDE  25 MG TAB] 90 tablet 1    Sig: TAKE 1 TABLET (25 MG TOTAL) BY MOUTH DAILY.     Cardiovascular: Diuretics - Thiazide Failed - 08/26/2024   4:29 PM      Failed - Cr in normal range and within 180 days    Creat  Date Value Ref Range Status  08/16/2020 0.90 0.50 - 0.99 mg/dL Final    Comment:    For patients >52 years of age, the reference limit for Creatinine is approximately 13% higher for people identified as African-American. .    Creatinine, Ser  Date Value Ref Range Status  01/15/2024 1.41 (H) 0.57 - 1.00 mg/dL Final         Failed - K in normal range and within 180 days    Potassium  Date Value Ref Range Status  01/15/2024 4.6 3.5 - 5.2 mmol/L Final         Failed - Na in normal range and within 180 days    Sodium  Date Value Ref Range Status  01/15/2024 140 134 - 144 mmol/L Final         Failed - Valid encounter within last 6 months    Recent Outpatient Visits           7 months ago Essential hypertension   [facility] [place] - A Dept Of [place]. [facility] [place], [place] W, NP   10 months ago Type 2 diabetes mellitus with other specified complication, without long-term current use of insulin  [facility])   [facility] - A Dept Of [place]. [facility] Delbert Clam, MD   1 year ago Type 2 diabetes mellitus with hyperglycemia, without long-term current use of insulin  [facility])   [facility] - A Dept Of [place]. [facility] [place], [place] W, NP   1 year ago Type 2 diabetes mellitus with hyperglycemia, without long-term current use of insulin  [facility])   [place] [facility] - A Dept Of [place]. [facility] [place], [place], [place]   1 year ago Type 2 diabetes mellitus with hyperglycemia, without long-term current use of insulin  [facility])   [facility] - A Dept Of [place]. [facility] [place], [place] W, NP              Passed - Last BP in normal range    BP Readings from Last 1 Encounters:  01/15/24 122/74

## 2024-08-30 ENCOUNTER — Telehealth: Payer: Self-pay | Admitting: Nurse Practitioner

## 2024-08-30 NOTE — Telephone Encounter (Signed)
Pt confirmed appt 9/10

## 2024-08-31 ENCOUNTER — Encounter: Payer: Self-pay | Admitting: Nurse Practitioner

## 2024-08-31 ENCOUNTER — Ambulatory Visit: Attending: Nurse Practitioner | Admitting: Nurse Practitioner

## 2024-08-31 VITALS — BP 159/89 | HR 101 | Temp 98.5°F | Resp 18 | Ht 59.0 in | Wt 168.0 lb

## 2024-08-31 DIAGNOSIS — E119 Type 2 diabetes mellitus without complications: Secondary | ICD-10-CM

## 2024-08-31 DIAGNOSIS — J069 Acute upper respiratory infection, unspecified: Secondary | ICD-10-CM

## 2024-08-31 DIAGNOSIS — J309 Allergic rhinitis, unspecified: Secondary | ICD-10-CM

## 2024-08-31 DIAGNOSIS — E1169 Type 2 diabetes mellitus with other specified complication: Secondary | ICD-10-CM

## 2024-08-31 DIAGNOSIS — I10 Essential (primary) hypertension: Secondary | ICD-10-CM

## 2024-08-31 DIAGNOSIS — Z7984 Long term (current) use of oral hypoglycemic drugs: Secondary | ICD-10-CM

## 2024-08-31 DIAGNOSIS — E559 Vitamin D deficiency, unspecified: Secondary | ICD-10-CM

## 2024-08-31 DIAGNOSIS — R058 Other specified cough: Secondary | ICD-10-CM

## 2024-08-31 MED ORDER — BENZONATATE 100 MG PO CAPS: 200.0000 mg | ORAL_CAPSULE | Freq: Two times a day (BID) | ORAL | 0 refills | Status: AC | PRN

## 2024-08-31 MED ORDER — FLUTICASONE PROPIONATE 50 MCG/ACT NA SUSP: 2.0000 | Freq: Every day | NASAL | 1 refills | Status: AC

## 2024-08-31 MED ORDER — METFORMIN HCL 500 MG PO TABS: 500.0000 mg | ORAL_TABLET | Freq: Two times a day (BID) | ORAL | 1 refills | Status: AC

## 2024-08-31 MED ORDER — PROMETHAZINE-DM 6.25-15 MG/5ML PO SYRP: 5.0000 mL | ORAL_SOLUTION | Freq: Four times a day (QID) | ORAL | 0 refills | Status: AC | PRN

## 2024-08-31 MED ORDER — VITAMIN D (ERGOCALCIFEROL) 1.25 MG (50000 UNIT) PO CAPS: 50000.0000 [IU] | ORAL_CAPSULE | ORAL | 0 refills | Status: AC

## 2024-08-31 NOTE — Progress Notes (Unsigned)
 Assessment & Plan:  Maria Robinson was seen today for hypertension and sore throat.  Diagnoses and all orders for this visit:  Viral URI with cough  Diabetes mellitus treated with oral medication (HCC) -     metFORMIN  (GLUCOPHAGE ) 500 MG tablet; Take 1 tablet (500 mg total) by mouth 2 (two) times daily with a meal.  Allergic rhinitis, unspecified seasonality, unspecified trigger -     fluticasone  (FLONASE ) 50 MCG/ACT nasal spray; Place 2 sprays into both nostrils daily.  Other cough -     promethazine -dextromethorphan (PROMETHAZINE -DM) 6.25-15 MG/5ML syrup; Take 5 mLs by mouth 4 (four) times daily as needed for cough.  Vitamin D  deficiency disease -     Vitamin D , Ergocalciferol , (DRISDOL ) 1.25 MG (50000 UNIT) CAPS capsule; Take 1 capsule (50,000 Units total) by mouth every 7 (seven) days.  Type 2 diabetes mellitus with other specified complication, without long-term current use of insulin  (HCC)  Upper respiratory tract infection, unspecified type -     benzonatate  (TESSALON ) 100 MG capsule; Take 2 capsules (200 mg total) by mouth 2 (two) times daily as needed for cough.    Patient has been counseled on age-appropriate routine health concerns for screening and prevention. These are reviewed and up-to-date. Referrals have been placed accordingly. Immunizations are up-to-date or declined.    Subjective:   Chief Complaint  Patient presents with   Hypertension   Sore Throat    Symptoms stared Saturday , along with cough and congestion.    Maria Robinson  68 y.o. female presents to office today    HPI  ROS  Past Medical History:  Diagnosis Date   Allergy    Cataract    Diabetes mellitus without complication (HCC)    Hypertension    Low blood potassium    Sickle cell trait (HCC)     Past Surgical History:  Procedure Laterality Date   BIOPSY  06/13/2019   Procedure: BIOPSY;  Surgeon: Charlanne Groom, MD;  Location: [facility];  Service: Endoscopy;;   CESAREAN SECTION      x 3   ESOPHAGOGASTRODUODENOSCOPY (EGD) WITH PROPOFOL  N/A 06/13/2019   Procedure: ESOPHAGOGASTRODUODENOSCOPY (EGD) WITH PROPOFOL ;  Surgeon: Charlanne Groom, MD;  Location: [facility];  Service: Endoscopy;  Laterality: N/A;   HAND SURGERY Right    MALONEY DILATION  06/13/2019   Procedure: MALONEY DILATION;  Surgeon: Charlanne Groom, MD;  Location: [facility];  Service: Endoscopy;;    Family History  Problem Relation Age of Onset   Diabetes Father    Colon cancer Neg Hx    Colon polyps Neg Hx    Esophageal cancer Neg Hx    Rectal cancer Neg Hx    Stomach cancer Neg Hx     Social History Reviewed with no changes to be made today.   Outpatient Medications Prior to Visit  Medication Sig Dispense Refill   Accu-Chek Softclix Lancets lancets Use as instructed. Check blood glucose level by fingerstick twice per day. 100 each 12   Accu-Chek Softclix Lancets lancets Use as instructed. Check blood glucose level by fingerstick twice per day. 100 each 6   amLODipine  (NORVASC ) 10 MG tablet TAKE 1 TABLET BY MOUTH EVERY DAY 90 tablet 1   atorvastatin  (LIPITOR) 40 MG tablet TAKE 1 TABLET BY MOUTH EVERY DAY 90 tablet 1   Blood Glucose Monitoring Suppl (ACCU-CHEK GUIDE ME) w/Device KIT Use as instructed. Check blood glucose level by fingerstick twice per day. 1 kit 0   cetirizine  (ZYRTEC ) 10 MG tablet Take 1  tablet (10 mg total) by mouth daily. 90 tablet 1   glimepiride  (AMARYL ) 4 MG tablet TAKE 1 TABLET (4 MG TOTAL) BY MOUTH 2 (TWO) TIMES DAILY. MUST HAVE OFFICE VISIT FOR REFILLS 180 tablet 1   glucose blood (ACCU-CHEK GUIDE) test strip Use as instructed. Check blood glucose by fingerstick twice per day. 100 each 12   glucose blood test strip Use as instructed to check blood sugar daily. 100 each 2   hydrochlorothiazide  (HYDRODIURIL ) 25 MG tablet TAKE 1 TABLET (25 MG TOTAL) BY MOUTH DAILY. 90 tablet 1   ibuprofen  (ADVIL ) 800 MG tablet Take 1 tablet (800 mg total) by mouth every 8 (eight) hours as needed.  30 tablet 0   pantoprazole  (PROTONIX ) 40 MG tablet Take 1 tablet (40 mg total) by mouth daily. 90 tablet 1   SHINGRIX  injection Inject 0.5 mLs into the muscle once.     valsartan  (DIOVAN ) 80 MG tablet Take 1 tablet (80 mg total) by mouth daily. 90 tablet 3   metFORMIN  (GLUCOPHAGE ) 500 MG tablet Take 1 tablet (500 mg total) by mouth 2 (two) times daily with a meal. Please keep upcoming appt for additional refills. 60 tablet 0   benzonatate  (TESSALON ) 100 MG capsule Take 1 capsule (100 mg total) by mouth 2 (two) times daily as needed for cough. (Patient not taking: Reported on 08/31/2024) 20 capsule 0   fluticasone  (FLONASE ) 50 MCG/ACT nasal spray Place 2 sprays into both nostrils daily. (Patient not taking: Reported on 08/31/2024) 16 each 0   promethazine -dextromethorphan (PROMETHAZINE -DM) 6.25-15 MG/5ML syrup TAKE 5 ML BY MOUTH AT BEDTIME AS NEEDED FOR COUGH (Patient not taking: Reported on 08/31/2024) 118 mL 0   Vitamin D , Ergocalciferol , (DRISDOL ) 1.25 MG (50000 UNIT) CAPS capsule TAKE 1 CAPSULE (50,000 UNITS TOTAL) BY MOUTH EVERY 7 (SEVEN) DAYS (Patient not taking: Reported on 08/31/2024) 12 capsule 0   No facility-administered medications prior to visit.    Allergies  Allergen Reactions   Penicillins Rash and Swelling    Did it involve swelling of the face/tongue/throat, SOB, or low BP? y  Did it involve sudden or severe rash/hives, skin peeling, or any reaction on the inside of your mouth or nose? y  Did you need to seek medical attention at a hospital or doctor's office? y  When did it last happen? 2026        If all above answers are "NO", may proceed with cephalosporin use.       Objective:    BP (!) 159/89 (BP Location: Left Arm, Patient Position: Sitting, Cuff Size: Large)   Pulse (!) 101   Temp 98.5 F (36.9 C) (Oral)   Resp 18   Ht 4' 11 (1.499 m)   Wt 168 lb (76.2 kg)   SpO2 98%   BMI 33.93 kg/m  Wt Readings from Last 3 Encounters:  08/31/24 168 lb (76.2 kg)   01/15/24 169 lb 3.2 oz (76.7 kg)  10/27/23 167 lb 9.6 oz (76 kg)    Physical Exam       Patient has been counseled extensively about nutrition and exercise as well as the importance of adherence with medications and regular follow-up. The patient was given clear instructions to go to ER or return to medical center if symptoms don't improve, worsen or new problems develop. The patient verbalized understanding.   Follow-up: No follow-ups on file.   Haze LELON Servant, FNP-BC [facility] [place], [place] [phone]   08/31/2024, 10:02 AM

## 2024-09-01 ENCOUNTER — Encounter: Payer: Self-pay | Admitting: Nurse Practitioner

## 2024-09-17 ENCOUNTER — Other Ambulatory Visit: Payer: Self-pay | Admitting: Nurse Practitioner

## 2024-09-17 DIAGNOSIS — K219 Gastro-esophageal reflux disease without esophagitis: Secondary | ICD-10-CM

## 2024-11-22 ENCOUNTER — Other Ambulatory Visit: Payer: Self-pay | Admitting: Nurse Practitioner

## 2024-11-22 DIAGNOSIS — E1165 Type 2 diabetes mellitus with hyperglycemia: Secondary | ICD-10-CM

## 2024-12-11 ENCOUNTER — Other Ambulatory Visit: Payer: Self-pay | Admitting: Nurse Practitioner

## 2024-12-11 DIAGNOSIS — I10 Essential (primary) hypertension: Secondary | ICD-10-CM
